# Patient Record
Sex: Male | Born: 1935
Health system: Southern US, Community
[De-identification: ages and names within clinical notes are randomized; demographics above are authoritative.]

## PROBLEM LIST (undated history)

## (undated) DIAGNOSIS — N183 Chronic kidney disease, stage 3 unspecified: Secondary | ICD-10-CM

## (undated) DIAGNOSIS — Z8673 Personal history of transient ischemic attack (TIA), and cerebral infarction without residual deficits: Secondary | ICD-10-CM

## (undated) DIAGNOSIS — I255 Ischemic cardiomyopathy: Secondary | ICD-10-CM

## (undated) DIAGNOSIS — I10 Essential (primary) hypertension: Secondary | ICD-10-CM

## (undated) DIAGNOSIS — M069 Rheumatoid arthritis, unspecified: Secondary | ICD-10-CM

## (undated) DIAGNOSIS — C61 Malignant neoplasm of prostate: Secondary | ICD-10-CM

## (undated) DIAGNOSIS — I739 Peripheral vascular disease, unspecified: Secondary | ICD-10-CM

## (undated) DIAGNOSIS — E7800 Pure hypercholesterolemia, unspecified: Secondary | ICD-10-CM

## (undated) DIAGNOSIS — I5022 Chronic systolic (congestive) heart failure: Secondary | ICD-10-CM

## (undated) DIAGNOSIS — I779 Disorder of arteries and arterioles, unspecified: Secondary | ICD-10-CM

## (undated) DIAGNOSIS — I2510 Atherosclerotic heart disease of native coronary artery without angina pectoris: Secondary | ICD-10-CM

## (undated) HISTORY — PX: CORONARY ARTERY BYPASS GRAFT: SHX141

## (undated) HISTORY — PX: KNEE ARTHROSCOPY: SUR90

## (undated) HISTORY — DX: Rheumatoid arthritis, unspecified: M06.9

## (undated) HISTORY — DX: Personal history of transient ischemic attack (TIA), and cerebral infarction without residual deficits: Z86.73

## (undated) HISTORY — DX: Essential (primary) hypertension: I10

## (undated) HISTORY — DX: Disorder of arteries and arterioles, unspecified: I77.9

## (undated) HISTORY — PX: CAROTID ENDARTERECTOMY: SUR193

## (undated) HISTORY — DX: Pure hypercholesterolemia, unspecified: E78.00

## (undated) HISTORY — DX: Atherosclerotic heart disease of native coronary artery without angina pectoris: I25.10

## (undated) HISTORY — PX: BACK SURGERY: SHX140

## (undated) HISTORY — DX: Chronic systolic (congestive) heart failure: I50.22

## (undated) HISTORY — DX: Peripheral vascular disease, unspecified: I73.9

## (undated) HISTORY — DX: Malignant neoplasm of prostate: C61

---

## 1998-09-27 DIAGNOSIS — Z8673 Personal history of transient ischemic attack (TIA), and cerebral infarction without residual deficits: Secondary | ICD-10-CM

## 1998-09-27 HISTORY — DX: Personal history of transient ischemic attack (TIA), and cerebral infarction without residual deficits: Z86.73

## 2003-06-12 ENCOUNTER — Ambulatory Visit (HOSPITAL_COMMUNITY): Admission: RE | Admit: 2003-06-12 | Discharge: 2003-06-12 | Payer: Self-pay | Admitting: Orthopedic Surgery

## 2003-06-12 ENCOUNTER — Encounter: Payer: Self-pay | Admitting: Orthopedic Surgery

## 2003-09-28 HISTORY — PX: RADIOACTIVE SEED IMPLANT: SHX5150

## 2003-10-07 ENCOUNTER — Ambulatory Visit (HOSPITAL_COMMUNITY): Admission: RE | Admit: 2003-10-07 | Discharge: 2003-10-07 | Payer: Self-pay | Admitting: Orthopedic Surgery

## 2003-12-19 ENCOUNTER — Emergency Department (HOSPITAL_COMMUNITY): Admission: EM | Admit: 2003-12-19 | Discharge: 2003-12-19 | Payer: Self-pay | Admitting: Emergency Medicine

## 2003-12-23 ENCOUNTER — Ambulatory Visit: Admission: RE | Admit: 2003-12-23 | Discharge: 2004-01-15 | Payer: Self-pay | Admitting: Radiation Oncology

## 2003-12-31 ENCOUNTER — Ambulatory Visit (HOSPITAL_COMMUNITY): Admission: RE | Admit: 2003-12-31 | Discharge: 2003-12-31 | Payer: Self-pay | Admitting: Cardiology

## 2004-03-26 ENCOUNTER — Encounter (INDEPENDENT_AMBULATORY_CARE_PROVIDER_SITE_OTHER): Payer: Self-pay | Admitting: Specialist

## 2004-03-26 ENCOUNTER — Inpatient Hospital Stay (HOSPITAL_COMMUNITY): Admission: RE | Admit: 2004-03-26 | Discharge: 2004-03-27 | Payer: Self-pay | Admitting: Vascular Surgery

## 2004-04-10 ENCOUNTER — Ambulatory Visit: Admission: RE | Admit: 2004-04-10 | Discharge: 2004-06-25 | Payer: Self-pay | Admitting: Radiation Oncology

## 2004-05-12 ENCOUNTER — Ambulatory Visit (HOSPITAL_COMMUNITY): Admission: RE | Admit: 2004-05-12 | Discharge: 2004-05-12 | Payer: Self-pay | Admitting: Urology

## 2004-05-12 ENCOUNTER — Ambulatory Visit (HOSPITAL_BASED_OUTPATIENT_CLINIC_OR_DEPARTMENT_OTHER): Admission: RE | Admit: 2004-05-12 | Discharge: 2004-05-12 | Payer: Self-pay | Admitting: Urology

## 2004-07-18 ENCOUNTER — Ambulatory Visit (HOSPITAL_COMMUNITY): Admission: RE | Admit: 2004-07-18 | Discharge: 2004-07-18 | Payer: Self-pay | Admitting: *Deleted

## 2005-08-27 ENCOUNTER — Inpatient Hospital Stay (HOSPITAL_BASED_OUTPATIENT_CLINIC_OR_DEPARTMENT_OTHER): Admission: RE | Admit: 2005-08-27 | Discharge: 2005-08-27 | Payer: Self-pay | Admitting: Cardiology

## 2006-09-14 ENCOUNTER — Inpatient Hospital Stay (HOSPITAL_COMMUNITY): Admission: EM | Admit: 2006-09-14 | Discharge: 2006-09-26 | Payer: Self-pay | Admitting: Emergency Medicine

## 2006-09-15 ENCOUNTER — Encounter: Payer: Self-pay | Admitting: Vascular Surgery

## 2006-09-16 ENCOUNTER — Encounter: Payer: Self-pay | Admitting: Vascular Surgery

## 2006-10-14 ENCOUNTER — Emergency Department (HOSPITAL_COMMUNITY): Admission: EM | Admit: 2006-10-14 | Discharge: 2006-10-14 | Payer: Self-pay | Admitting: Emergency Medicine

## 2006-10-27 ENCOUNTER — Encounter (HOSPITAL_COMMUNITY): Admission: RE | Admit: 2006-10-27 | Discharge: 2006-12-23 | Payer: Self-pay | Admitting: Cardiology

## 2006-11-24 ENCOUNTER — Ambulatory Visit: Payer: Self-pay | Admitting: Cardiothoracic Surgery

## 2007-03-03 ENCOUNTER — Inpatient Hospital Stay (HOSPITAL_BASED_OUTPATIENT_CLINIC_OR_DEPARTMENT_OTHER): Admission: RE | Admit: 2007-03-03 | Discharge: 2007-03-03 | Payer: Self-pay | Admitting: Cardiology

## 2007-03-09 ENCOUNTER — Ambulatory Visit (HOSPITAL_COMMUNITY): Admission: RE | Admit: 2007-03-09 | Discharge: 2007-03-10 | Payer: Self-pay | Admitting: Cardiology

## 2008-04-02 HISTORY — PX: CARDIOVASCULAR STRESS TEST: SHX262

## 2008-04-04 ENCOUNTER — Inpatient Hospital Stay (HOSPITAL_COMMUNITY): Admission: AD | Admit: 2008-04-04 | Discharge: 2008-04-06 | Payer: Self-pay | Admitting: Cardiology

## 2009-02-23 ENCOUNTER — Emergency Department (HOSPITAL_COMMUNITY): Admission: EM | Admit: 2009-02-23 | Discharge: 2009-02-24 | Payer: Self-pay | Admitting: Emergency Medicine

## 2009-04-09 ENCOUNTER — Inpatient Hospital Stay (HOSPITAL_COMMUNITY): Admission: EM | Admit: 2009-04-09 | Discharge: 2009-04-11 | Payer: Self-pay | Admitting: Cardiology

## 2009-04-09 ENCOUNTER — Ambulatory Visit: Payer: Self-pay | Admitting: Cardiovascular Disease

## 2009-07-25 ENCOUNTER — Encounter: Admission: RE | Admit: 2009-07-25 | Discharge: 2009-07-25 | Payer: Self-pay | Admitting: Internal Medicine

## 2010-06-12 ENCOUNTER — Ambulatory Visit: Payer: Self-pay | Admitting: Cardiology

## 2010-10-18 ENCOUNTER — Encounter: Payer: Self-pay | Admitting: Urology

## 2010-11-12 LAB — LIPID PANEL: HDL: 33 mg/dL — AB (ref 35–70)

## 2010-11-13 ENCOUNTER — Other Ambulatory Visit: Payer: Self-pay | Admitting: Dermatology

## 2010-12-04 ENCOUNTER — Encounter: Payer: Self-pay | Admitting: Cardiology

## 2010-12-04 DIAGNOSIS — Z8673 Personal history of transient ischemic attack (TIA), and cerebral infarction without residual deficits: Secondary | ICD-10-CM | POA: Insufficient documentation

## 2010-12-04 DIAGNOSIS — E7800 Pure hypercholesterolemia, unspecified: Secondary | ICD-10-CM | POA: Insufficient documentation

## 2010-12-04 DIAGNOSIS — I10 Essential (primary) hypertension: Secondary | ICD-10-CM | POA: Insufficient documentation

## 2010-12-04 DIAGNOSIS — I519 Heart disease, unspecified: Secondary | ICD-10-CM | POA: Insufficient documentation

## 2010-12-04 DIAGNOSIS — M109 Gout, unspecified: Secondary | ICD-10-CM | POA: Insufficient documentation

## 2010-12-04 DIAGNOSIS — N189 Chronic kidney disease, unspecified: Secondary | ICD-10-CM | POA: Insufficient documentation

## 2010-12-04 DIAGNOSIS — C61 Malignant neoplasm of prostate: Secondary | ICD-10-CM | POA: Insufficient documentation

## 2010-12-04 DIAGNOSIS — I25709 Atherosclerosis of coronary artery bypass graft(s), unspecified, with unspecified angina pectoris: Secondary | ICD-10-CM | POA: Insufficient documentation

## 2010-12-25 ENCOUNTER — Encounter: Payer: Self-pay | Admitting: Cardiology

## 2010-12-25 ENCOUNTER — Ambulatory Visit (INDEPENDENT_AMBULATORY_CARE_PROVIDER_SITE_OTHER): Payer: PRIVATE HEALTH INSURANCE | Admitting: Cardiology

## 2010-12-25 VITALS — BP 160/82 | HR 58 | Ht 73.0 in | Wt 189.4 lb

## 2010-12-25 DIAGNOSIS — R0989 Other specified symptoms and signs involving the circulatory and respiratory systems: Secondary | ICD-10-CM

## 2010-12-25 DIAGNOSIS — I2510 Atherosclerotic heart disease of native coronary artery without angina pectoris: Secondary | ICD-10-CM

## 2010-12-25 DIAGNOSIS — I10 Essential (primary) hypertension: Secondary | ICD-10-CM

## 2010-12-25 DIAGNOSIS — E7800 Pure hypercholesterolemia, unspecified: Secondary | ICD-10-CM

## 2010-12-25 DIAGNOSIS — I779 Disorder of arteries and arterioles, unspecified: Secondary | ICD-10-CM

## 2010-12-25 DIAGNOSIS — I519 Heart disease, unspecified: Secondary | ICD-10-CM

## 2010-12-25 NOTE — Patient Instructions (Signed)
Stop Tekturna.  We will schedule you for carotid doppler study.  Continue exercise.  Check blood pressure 3-4 times a week and keep a diary. Report back to me in 3 weeks with readings. Call Synetta Fail at [phone].  We will schedule you for a follow up office visit in 6 months.

## 2010-12-27 ENCOUNTER — Encounter: Payer: Self-pay | Admitting: Cardiology

## 2010-12-27 DIAGNOSIS — I779 Disorder of arteries and arterioles, unspecified: Secondary | ICD-10-CM | POA: Insufficient documentation

## 2010-12-27 NOTE — Assessment & Plan Note (Signed)
Blood pressure is significantly elevated today but other readings have been normal. We have recommended stopping his Tekturna given recent advisory concerning use in patients on ACE inhibitors. I have asked that he monitor his blood pressure closely and call us with a report in 3 weeks. If his blood pressure remains elevated we may need to add clonidine or hydralazine to his current regimen.

## 2010-12-27 NOTE — Assessment & Plan Note (Signed)
He has no significant anginal symptoms on medical therapy. We will continue with risk factor modification.

## 2010-12-27 NOTE — Assessment & Plan Note (Signed)
Patient is well compensated on medical therapy with no evidence of volume overload. Continue therapy with ACE inhibitors and beta blocker. Currently on no diuretic therapy.

## 2010-12-27 NOTE — Assessment & Plan Note (Signed)
Status post bilateral carotid endarterectomies. He does have a bruit on the right. His last Doppler study was in 2007. We will schedule for repeat Doppler studies.

## 2010-12-27 NOTE — Progress Notes (Signed)
HPI Pelvis seen today for followup. He states he is doing very well. He has had no significant chest pain or shortness of breath. He denies any palpitations. He does note that his energy is not as good as it once was. He remains active exercising in the pool 3 days a week and playing golf once a week. His blood pressure readings at home and with his primary physician have been normal. No Known Allergies  Current Outpatient Prescriptions on File Prior to Visit  Medication Sig Dispense Refill  . allopurinol (ZYLOPRIM) 300 MG tablet Take 300 mg by mouth daily.        Marland Kitchen amLODipine (NORVASC) 10 MG tablet Take 10 mg by mouth daily.        Marland Kitchen aspirin 81 MG tablet Take 81 mg by mouth daily.        Marland Kitchen atorvastatin (LIPITOR) 40 MG tablet Take 40 mg by mouth daily.        . benazepril (LOTENSIN) 40 MG tablet Take 40 mg by mouth daily.        . clopidogrel (PLAVIX) 75 MG tablet Take 75 mg by mouth daily.        . ergocalciferol (VITAMIN D2) 50000 UNITS capsule Take 50,000 Units by mouth every 30 (thirty) days. Twice monthly      . folic acid (FOLVITE) 400 MCG tablet Take 400 mcg by mouth 2 (two) times daily.       . isosorbide mononitrate (IMDUR) 30 MG 24 hr tablet Take 30 mg by mouth 2 (two) times daily.        . methotrexate (RHEUMATREX) 2.5 MG tablet Take 2.5 mg by mouth once a week. Caution:Chemotherapy. Protect from light. 4 tablets once a week      . metoprolol tartrate (LOPRESSOR) 25 MG tablet Take 12.5 mg by mouth 2 (two) times daily.        Marland Kitchen aliskiren (TEKTURNA) 150 MG tablet Take 150 mg by mouth daily.        Marland Kitchen co-enzyme Q-10 30 MG capsule Take 30 mg by mouth daily.        . metFORMIN (GLUCOPHAGE) 500 MG tablet Take 1 tablet (500 mg total) by mouth daily.  30 tablet      Past Medical History  Diagnosis Date  . CAD (coronary artery disease)     REDO CABG 2007. SUBSEQUENT OCCLUSION OF ALL VEIN GRAFTS.   Marland Kitchen HTN (hypertension)   . Hypercholesteremia   . Chronic renal insufficiency   . Left  ventricular dysfunction     MODERATE  . Gout   . History of TIAs 2000  . Prostate cancer     Past Surgical History  Procedure Date  . Coronary artery bypass graft 0454,0981  . Carotid endarterectomy 1992, 2007    LEFT, RIGHT  . Back surgery   . Radioactive seed implant 2005  . Knee arthroscopy     RIGHT KNEE    Family History  Problem Relation Age of Onset  . Other Father 48    CEREBRAL HEMORRHAGE  . Cancer Mother 43    History   Social History  . Marital Status: Married    Spouse Name: N/A    Number of Children: 3  . Years of Education: N/A   Occupational History  . [employer]     retired   Social History Main Topics  . Smoking status: Former Smoker    Types: Cigarettes    Quit date: 09/27/1986  . Smokeless tobacco:  Not on file  . Alcohol Use: No  . Drug Use: No  . Sexually Active:    Other Topics Concern  . Not on file   Social History Narrative  . No narrative on file    ROS The patient denies any heat or cold intolerance.  No weight gain or weight loss.  The patient denies headaches or blurry vision.  There is no cough or sputum production.  The patient denies dizziness.  There is no hematuria or hematochezia.  The patient denies any muscle aches or arthritis.  The patient denies any rash.  The patient denies frequent falling or instability.  There is no history of depression or anxiety.  All other systems were reviewed and are negative.  PHYSICAL EXAM BP 160/82  Pulse 58  Ht 6\' 1"  (1.854 m)  Wt 189 lb 6.4 oz (85.911 kg)  BMI 24.99 kg/m2 The patient is alert and oriented x 3.  The mood and affect are normal.  The skin is warm and dry.  Color is normal.  The HEENT exam reveals that the sclera are nonicteric.  The mucous membranes are moist.  The carotids are 2+ with a soft right carotid bruit. He has bilateral endarterectomy scars.  There is no thyromegaly.  There is no JVD.  The lungs are clear.  The chest wall is non tender. There is an  old median sternotomy scar.  The heart exam reveals a regular rate with a normal S1 and S2.  There are no murmurs, gallops, or rubs.  The PMI is not displaced.   Abdominal exam reveals good bowel sounds.  There is no guarding or rebound.  There is no hepatosplenomegaly or tenderness.  There are no masses.  Exam of the legs reveal no clubbing, cyanosis, or edema.  The legs are without rashes.  The distal pulses are intact.  Cranial nerves II - XII are intact.  Motor and sensory functions are intact.  The gait is normal.  Laboratory data: Dated November 12, 2010 complete chemistry panel was normal. Total cholesterol 137, triglycerides 151, HDL 33, LDL 74. TSH was normal. ASSESSMENT AND PLAN

## 2010-12-27 NOTE — Assessment & Plan Note (Signed)
Lipids are acceptable on current therapy with Lipitor.

## 2010-12-30 ENCOUNTER — Other Ambulatory Visit: Payer: Self-pay | Admitting: Cardiology

## 2010-12-30 DIAGNOSIS — R0989 Other specified symptoms and signs involving the circulatory and respiratory systems: Secondary | ICD-10-CM

## 2011-01-03 LAB — BASIC METABOLIC PANEL
CO2: 27 mEq/L (ref 19–32)
Calcium: 8.6 mg/dL (ref 8.4–10.5)
Calcium: 8.7 mg/dL (ref 8.4–10.5)
Chloride: 108 mEq/L (ref 96–112)
GFR calc Af Amer: 59 mL/min — ABNORMAL LOW (ref >60)
GFR calc non Af Amer: 49 mL/min — ABNORMAL LOW (ref >60)
Glucose, Bld: 139 mg/dL — ABNORMAL HIGH (ref 70–99)
Glucose, Bld: 107 mg/dL — ABNORMAL HIGH (ref 70–99)
Potassium: 3.5 mEq/L (ref 3.5–5.1)
Potassium: 4 mEq/L (ref 3.5–5.1)
Sodium: 137 mEq/L (ref 135–145)
Sodium: 140 mEq/L (ref 135–145)

## 2011-01-03 LAB — CBC
HCT: 38.5 % — ABNORMAL LOW (ref 39.0–52.0)
HCT: 40.8 % (ref 39.0–52.0)
Hemoglobin: 12.9 g/dL — ABNORMAL LOW (ref 13.0–17.0)
Hemoglobin: 12.9 g/dL — ABNORMAL LOW (ref 13.0–17.0)
Hemoglobin: 13.8 g/dL (ref 13.0–17.0)
MCV: 88.1 fL (ref 78.0–100.0)
Platelets: 213 10*3/uL (ref 150–400)
RBC: 4.31 MIL/uL (ref 4.22–5.81)
RBC: 4.36 MIL/uL (ref 4.22–5.81)
RBC: 4.63 MIL/uL (ref 4.22–5.81)
WBC: 6.5 10*3/uL (ref 4.0–10.5)
WBC: 11 10*3/uL — ABNORMAL HIGH (ref 4.0–10.5)
WBC: 13.4 10*3/uL — ABNORMAL HIGH (ref 4.0–10.5)

## 2011-01-03 LAB — GLUCOSE, CAPILLARY: Glucose-Capillary: 162 mg/dL — ABNORMAL HIGH (ref 70–99)

## 2011-01-03 LAB — COMPREHENSIVE METABOLIC PANEL
AST: 29 U/L (ref 0–37)
BUN: 29 mg/dL — ABNORMAL HIGH (ref 6–23)
CO2: 24 mEq/L (ref 19–32)
Chloride: 108 mEq/L (ref 96–112)
Creatinine, Ser: 1.56 mg/dL — ABNORMAL HIGH (ref 0.4–1.5)
GFR calc non Af Amer: 44 mL/min — ABNORMAL LOW (ref >60)
Total Bilirubin: 0.7 mg/dL (ref 0.3–1.2)

## 2011-01-03 LAB — CARDIAC PANEL(CRET KIN+CKTOT+MB+TROPI)
CK, MB: 5.4 ng/mL — ABNORMAL HIGH (ref 0.3–4.0)
CK, MB: 6.2 ng/mL — ABNORMAL HIGH (ref 0.3–4.0)
Relative Index: INVALID (ref 0.0–2.5)
Total CK: 57 U/L (ref 7–232)
Total CK: 81 U/L (ref 7–232)
Troponin I: 0.58 ng/mL (ref 0.00–0.06)

## 2011-01-03 LAB — HEMOGLOBIN A1C: Hgb A1c MFr Bld: 6 % (ref 4.6–6.1)

## 2011-01-03 LAB — DIFFERENTIAL
Basophils Absolute: 0.2 10*3/uL — ABNORMAL HIGH (ref 0.0–0.1)
Eosinophils Relative: 1 % (ref 0–5)
Lymphocytes Relative: 19 % (ref 12–46)
Neutro Abs: 9.8 10*3/uL — ABNORMAL HIGH (ref 1.7–7.7)

## 2011-01-03 LAB — HEPARIN LEVEL (UNFRACTIONATED): Heparin Unfractionated: 1.08 IU/mL — ABNORMAL HIGH (ref 0.30–0.70)

## 2011-01-05 LAB — URINALYSIS, ROUTINE W REFLEX MICROSCOPIC
Bilirubin Urine: NEGATIVE
Hgb urine dipstick: NEGATIVE
Ketones, ur: NEGATIVE mg/dL
Nitrite: NEGATIVE
Protein, ur: NEGATIVE mg/dL
Specific Gravity, Urine: 1.01 (ref 1.005–1.030)
Urobilinogen, UA: 0.2 mg/dL (ref 0.0–1.0)

## 2011-01-07 ENCOUNTER — Other Ambulatory Visit: Payer: Self-pay | Admitting: Cardiology

## 2011-01-07 DIAGNOSIS — R0989 Other specified symptoms and signs involving the circulatory and respiratory systems: Secondary | ICD-10-CM

## 2011-01-08 ENCOUNTER — Encounter: Payer: PRIVATE HEALTH INSURANCE | Admitting: Cardiology

## 2011-01-08 ENCOUNTER — Ambulatory Visit (INDEPENDENT_AMBULATORY_CARE_PROVIDER_SITE_OTHER): Payer: PRIVATE HEALTH INSURANCE | Admitting: Cardiology

## 2011-01-08 DIAGNOSIS — I6529 Occlusion and stenosis of unspecified carotid artery: Secondary | ICD-10-CM

## 2011-01-08 DIAGNOSIS — R0989 Other specified symptoms and signs involving the circulatory and respiratory systems: Secondary | ICD-10-CM

## 2011-01-11 ENCOUNTER — Encounter: Payer: Self-pay | Admitting: Cardiology

## 2011-01-12 ENCOUNTER — Telehealth: Payer: Self-pay | Admitting: *Deleted

## 2011-01-12 DIAGNOSIS — I10 Essential (primary) hypertension: Secondary | ICD-10-CM

## 2011-01-12 MED ORDER — HYDRALAZINE HCL 25 MG PO TABS: 25.0000 mg | ORAL_TABLET | Freq: Two times a day (BID) | ORAL | Status: DC

## 2011-01-12 NOTE — Telephone Encounter (Signed)
Notified of doppler results. Sent to Dr. Ricki Miller.

## 2011-01-12 NOTE — Telephone Encounter (Signed)
Came by with BP readings; Dr. Swaziland reviewed and states he needs to add Hydralizine 25 mg BID. BP readings averaged 129/63 to 167/85. Advised to continue to monitor BP

## 2011-01-19 ENCOUNTER — Encounter: Payer: Self-pay | Admitting: Cardiology

## 2011-02-09 NOTE — H&P (Signed)
NAME:  TRAJAN, GROVE NO.:  [id number]   MEDICAL RECORD NO.:  [id number]           PATIENT TYPE:   LOCATION:                                 FACILITY:   PHYSICIAN:  Peter M. Swaziland, M.D.  DATE OF BIRTH:  [birth year]   DATE OF ADMISSION:  DATE OF DISCHARGE:                              HISTORY & PHYSICAL   HISTORY OF PRESENT ILLNESS:  [name] [name] is a 77 year old white male  who has a known history of coronary artery disease.  He suffered a  myocardial infarction in 58 and in 1989.  He had multiple attempts at  angioplasty that were unsuccessful due to restenosis.  He subsequently  had coronary bypass surgery in 1989.  This included saphenous vein  grafts sequentially to the PDA and distal obtuse marginal vessel and a  LIMA graft to the LAD.  He had good left ventricular function with mild  inferior wall hypokinesia.  In December of 2007, he was admitted again  with a myocardial infarction.  This was a non-Q-wave myocardial  infarction.  He underwent repeat coronary angiography, and this  demonstrated severe disease in the vein graft sequentially to the PDA  and OM with thrombus.  He subsequently underwent repeat coronary artery  bypass surgery by Dr. Tyrone Sage.  This included redoing his sequential  bypass graft to the PDA and OM.  Also an intermediate branch was  grafted.  His LIMA graft was left in place.  The patient did well  initially postoperatively, but he since has been experiencing symptoms  of intermittent angina.  Some days he can walk on his treadmill without  any difficulty, but other days just walking to his mailbox he gets  significant chest pain and has to stop walking.  He subsequently was  evaluated with a stress Cardiolite study on Feb 24, 2007.  This  demonstrated a moderate inferolateral fixed defect consistent with scar.  However, there was a reversible anterior wall defect consistent with  ischemia.  His ejection fraction was reduced at  38%.  It was felt that  the inferior wall defect was consistent with his prior infarction.  However, we could not explain the anterior wall ischemia, and given his  ongoing symptoms, it was recommended he undergo cardiac catheterization  to further define his anatomy.  It was noted on his prior cardiac  catheterization that he had a 40-50% stenosis in the left subclavian  artery, and this was a potential reason for ischemia in the LAD  territory given the IMA graft.  However, he has no pressure differential  between his arms and denies any left arm pain or any vertebrobasilar  symptoms.   PAST MEDICAL HISTORY:  1. Coronary disease as noted above.  2. Hyperlipidemia.  3. Hypertension.  4. Prior bypass surgery in 1989 and in 2007.  5. History of gout.  6. History of TIAs in 2000.  7. Prior left carotid endarterectomy in 1992.  8. Prior back surgery.  9. History of prostate cancer with radioactive seed implant in 2005.  10.He had a right carotid endarterectomy in November of 2007.  11.He has had right knee arthroscopy.   CURRENT MEDICATIONS:  1. Tricor 145 mg daily.  2. Furosemide 10 mg per day.  3. Metoprolol 50 mg b.i.d.  4. Aspirin 325 mg daily.  5. Allopurinol 300 mg daily.  6. Amlodipine 10 mg per day.  7. Fosinopril HCT 20/12.5 mg daily.   ALLERGIES:  He has no known allergies.   SOCIAL HISTORY:  He quit smoking in 1988; he had a 30 pack-year history  of smoking.  He has 3 children in good health.  He is married.  He is  retired from [employer].   FAMILY HISTORY:  His father died of a cerebral hemorrhage at age 40.  Mother died of cancer at age 2.  One sister, age 74, is alive and well.   REVIEW OF SYSTEMS:  He has had no significant claudication.  He denies  any recent TIA or stroke.  He has had no arm pain.  No orthopnea.  Other  review of systems are negative.   PHYSICAL EXAMINATION:  GENERAL:  The patient is a pleasant white male in  no apparent  distress.  VITAL SIGNS:  His weight is 187, blood pressures 160/80, pulse 64 and  regular.  HEENT:  He is normocephalic and atraumatic.  His pupils are equal, round  and reactive to light and accommodation.  Extraocular movements are  full.  Oropharynx is clear.  NECK:  Supple without JVD, adenopathy, thyromegaly or bruits.  He has  bilateral carotid endarterectomy scars.  LUNGS:  Clear.  CARDIAC:  A regular rate and rhythm without gallop, murmur, rub or  click.  ABDOMEN:  Soft, nontender, without masses or bruits.  EXTREMITIES:  Without edema.  Pulses are 2+ and symmetric.  NEUROLOGIC:  Nonfocal.   LABORATORY DATA:  Resting ECG shows normal sinus rhythm with occasional  PVCs, nonspecific ST abnormality.  His glucose is 139, BUN 26,  creatinine 1.7, sodium 141, potassium 4.2, chloride 101, CO2 of 29.  Coags were normal.  CBC is normal.   IMPRESSION:  1. Recurrent angina pectoris.  2. Abnormal adenosine Cardiolite study.  3. Status post myocardial infarction.  4. Status post redo coronary bypass surgery in December of 2007.  5. Status post bilateral carotid endarterectomy.  6. Mild renal insufficiency.  7. Hypertension.  8. Hyperlipidemia.  9. Gout.   PLAN:  Will proceed with diagnostic cardiac catheterization.           ______________________________  Peter M. Swaziland, M.D.     PMJ/MEDQ  D:  03/01/2007  T:  03/01/2007  Job:  [id number]   cc:   Juline Patch, M.D.  Sheliah Plane, MD

## 2011-02-09 NOTE — Discharge Summary (Signed)
[place], [name] [name] NO.:  [id number]   MEDICAL RECORD NO.:  [id number]          PATIENT TYPE:  OIB   LOCATION:  6523                         FACILITY:  [facility]   PHYSICIAN:  Peter M. Swaziland, M.D.  DATE OF BIRTH:  [birth year]   DATE OF ADMISSION:  03/09/2007  DATE OF DISCHARGE:  03/10/2007                               DISCHARGE SUMMARY   INDICATION FOR PROCEDURE:  A 77 year old white male status post remote  angioplasties and coronary artery bypass surgery.  He presented in  December 2007 with a myocardial infarction and underwent redo coronary  artery bypass surgery, including having a vein graft placed the diagonal  and a sequential vein graft to the PDA and posterolateral branches of  the left circumflex coronary artery.  His LIMA graft was still patent at  that time.  He has had recurrent anginal symptoms and a positive  Cardiolite study.  Subsequent diagnostic cardiac catheterization  demonstrated occlusion of both the new vein grafts to the circumflex and  diagonal branches.  His old vein graft to the PDA was still patent and  but had an 80% ostial stenosis followed by a 60% stenosis in the  proximal vessel.  The native diagonal also had an 80% stenosis in the  proximal vessel and it was recommended he undergo stenting of both these  vessels.  He was admitted for that purpose.   For details his past medical history, social history, family history and  physical exam, please see prior dictated history and physical.   LABORATORY DATA:  His ECG at rest showed normal sinus rhythm with LVH by  voltage, otherwise no acute change.  His CBC was normal.  Glucose is  101, BUN 25, creatinine 1.6, sodium 142, potassium 4.8, chloride 104,  CO2 29.   HOSPITAL COURSE:  The patient was brought in as an outpatient to the  cardiac catheterization laboratory.  There he underwent stenting of the  proximal and ostial vein graft to the PDA using a 3.5 x 28 mm Cypher  stent.   This was postdilated to 4 atmospheres with a 4.0 Quantum  balloon.  A filter wire was deployed but we did not obtain any  significant debris post procedure.  He had an excellent angiographic  result and no significant restenosis and TIMI grade 3 flow.  We next  intervened on the first diagonal branch.  This was stented using a 2.5 x  18 mm Cypher stent.  Again an excellent angiographic result was obtained  with 0% residual stenosis and TIMI grade 3 flow.  The patient did have  some chest discomfort with balloon inflations but otherwise had no chest  discomfort.  After procedure his ECG remained stable.  He was  hemodynamically stable.  He was maintained on aspirin and Plavix and his  subsequent follow-up lab work showed a CPK of 109 with 9.3 MB and  troponin of 0.38.  His BUN was 25 and creatinine 1.41.  He had no groin  complications.  He was pain-free and ambulatory and had no ECG changes.  The patient was discharged home in stable condition  on March 10, 2007.   DISCHARGE DIAGNOSES:  1. Coronary artery disease, status post redo coronary artery bypass      graft in December 2007 with subsequent vein graft failure, now      status post successful stenting of the native diagonal branch and      the old saphenous vein graft to the posterior descending artery.  2. Mild enzyme leak without symptoms or electrocardiogram changes.  3. Hypertension.  4. Hyperlipidemia.  5. History of chronic renal insufficiency.  6. History of gout.  7. Prior transient ischemic attacks with previous left carotid      endarterectomy.  8. History of prostate cancer, status post radioactive seed implant.   DISCHARGE MEDICATIONS:  1. Coated aspirin 325 mg daily.  2. Plavix 75 mg daily.  3. Tricor 145 mg daily.  4. Allopurinol 300 mg per day.  5. Metoprolol 50 mg twice a day.  6. Lipitor 40 mg per day.  7. Amlodipine 10 mg daily.  8. Lisinopril/HCT 20/12.5 mg daily.  9. Nitroglycerin sublingual p.r.n.   The  patient is given activity instructions and diet instructions.  He  will be followed by Dr. Swaziland in 1-2 weeks.   DISCHARGE STATUS:  Improved.           ______________________________  Peter M. Swaziland, M.D.     PMJ/MEDQ  D:  03/10/2007  T:  03/10/2007  Job:  [id number]   cc:   Juline Patch, M.D.  Sheliah Plane, MD

## 2011-02-09 NOTE — Discharge Summary (Signed)
NAME:  [name] [name], CENTOLA NO.:  [id number]   MEDICAL RECORD NO.:  [id number]          PATIENT TYPE:  INP   LOCATION:  2504                         FACILITY:  [facility]   PHYSICIAN:  Peter M. Swaziland, M.D.  DATE OF BIRTH:  December 01, 1931   DATE OF ADMISSION:  04/09/2009  DATE OF DISCHARGE:  04/11/2009                               DISCHARGE SUMMARY   HISTORY OF PRESENT ILLNESS:  Mr. Savas Elvin is a 77 year old white male  with known history of coronary disease.  He is status post original  coronary artery bypass surgery in 1989.  He had redo coronary artery  bypass surgery in 2007.  However, he had early vein graft failure with  occlusion of his vein grafts within 6 months.  He subsequently had  stenting of an old vein graft to the PDA, but this subsequently failed  as well.  He had stenting of the diagonal branch.  He still had a patent  LIMA graft to the LAD and has been treated medically.  On this occasion,  the patient was in [place], [place] eating dinner when he  experienced a low-grade, grade 3/10 chest pain radiating to his right  shoulder.  He took nitroglycerin with some easing of the pain, but it  did persist.  He went to the hospital there and troponin came back  elevated at 1.08.  He was started on heparin and Plavix and transferred  to our facility for further evaluation.  Of note, he remained pain free  after his initial medical therapy.  He was pain free on arrival here.   For details of his past medical history, social history, family history,  and physical exam, please see admission history and physical.   LABORATORY DATA:  Chest x-ray showed calcified tortuous aorta, otherwise  no active disease.  His ECG showed sinus bradycardia with an incomplete  left bundle-branch block.  There were no acute ST or T-wave changes.  White count was 13,400, hemoglobin 13.8, hematocrit 40.8, platelets  256,000.  Coags were normal.  BNP level was 283.  Sodium was 140,  potassium 3.4, chloride 108, CO2 of 24, BUN 29, creatinine 1.56, glucose  of 107.  Liver function studies were all normal.  Calcium was normal at  9.0.  Cardiac panels here showed a troponin of 0.86, 0.92, and 0.58.  CPKs were 77 with 7.6 MB and 81 with 6.2 MB, and 57 with 5.4 MB.  A1c  was 6.0.   HOSPITAL COURSE:  The patient was admitted to intensive care.  He was  treated with IV nitroglycerin and heparin.  He was loaded with Plavix.  He had no subsequent chest pain through his hospital stay.  Because of  marked bradycardia, his metoprolol dose was reduced to 12.5 mg twice a  day.  He did have a leukocytosis that improved.  He was gently hydrated  and given Mucomyst therapy.  On April 10, 2009, he underwent cardiac  catheterization.  This demonstrated total occlusion of the dominant left  circumflex system.  He also had total occlusion of the native right  coronary artery.  He had collateral flow to the right coronary artery,  PDA, the obtuse marginal branch, and intermediate branch.  The stent in  the native diagonal was widely patent.  The LAD was supplied well by the  LIMA graft.  Left ventricular ejection fraction of 35%-40%.  Compared to  his prior study 1 year ago, there was no significant change.  Based on  these findings, we recommended continued medical management.  The  patient was ambulated in halls.  He had no groin complications.  At the  time of discharge, his BUN was 22 with creatinine of 1.2.  White count  was 6500.  The only change in his discharge medications was the addition  of Plavix 75 mg daily and a new prescription for Nitrolingual spray.   DISCHARGE DIAGNOSES:  1. Small non-Q-wave myocardial infarction.  2. Coronary disease status post redo coronary artery bypass surgery.  3. Mild renal insufficiency.  4. Leukocytosis, resolved.  5. Hypertension.   DISCHARGE MEDICATIONS:  1. Lisinopril/HCT 20/12.5 mg daily.  2. Amlodipine 10 mg daily.  3. Metoprolol 12.5  mg twice a day.  4. Ranexa 500 mg twice a day.  5. Isosorbide mononitrate 30 mg daily.  6. Allopurinol 300 mg per day.  7. Aspirin 325 mg daily.  8. Fenofibrate 67 mg daily.  9. Vitamin D 50,000 units monthly.  10.Folic acid 400 mcg daily.  11.Lipitor 80 mg daily.  12.CoQ10 daily.  13.Plavix 75 mg daily.  14.Nitrolingual sublingual spray p.r.n.   The patient has a followup appointment with Dr. Swaziland within the next 3  weeks.  He will gradually resume normal activities.   DISCHARGE STATUS:  Improved.           ______________________________  Peter M. Swaziland, M.D.     PMJ/MEDQ  D:  04/11/2009  T:  04/11/2009  Job:  [id number]

## 2011-02-09 NOTE — Cardiovascular Report (Signed)
NAMELEOPOLD, [name] [name] NO.:  [id number]   MEDICAL RECORD NO.:  [id number]          PATIENT TYPE:  OIB   LOCATION:  1962                         FACILITY:  [facility]   PHYSICIAN:  Peter M. Swaziland, M.D.  DATE OF BIRTH:  March 31, 1932   DATE OF PROCEDURE:  03/03/2007  DATE OF DISCHARGE:                            CARDIAC CATHETERIZATION   INDICATIONS FOR PROCEDURE:  77 year old white male who is status post  redo coronary artery bypass surgery in December of 2007.  He presents  with recurrent anginal symptoms.  Stress Cardiolite study shows evidence  of inferolateral infarct and anterior wall ischemia.  Ejection fraction  was 38%.   PROCEDURE:  Left heart catheterization, coronary left ventricular  angiography, abdominal aortic angiography, saphenous vein graft  angiography x2 and LIMA graft angiography.  Also angiography of the left  subclavian artery.   ACCESS:  Via the right femoral artery using the standard Seldinger  technique.   EQUIPMENT USED:  4-French 5-cm left Judkins catheter for the left  coronary, 4-French 3D-RC catheter for the right coronary, a multipurpose  catheter for the vein graft to the PDA, a left Amplatz 1 catheter for  the vein graft to the diagonal, a LIMA catheter for the LIMA.  Also a 4-  French pigtail catheter.   CONTRAST:  195 mL of Omnipaque.   MEDICATIONS:  Local anesthesia with 1% Xylocaine, Versed 2 mg IV.   HEMODYNAMIC DATA:  Aortic pressure was 142/56 with a mean of 88 mmHg.  Left ventricular pressure is 143 with an EDP of 20 mmHg.  Also of note,  there was a 10-mm pressure gradient across the left subclavian origin.   ANGIOGRAPHIC DATA:  The left coronary arises and distributes normally.  It is a dominant vessel.  The left main coronary is a moderately  calcified.  It has diffuse narrowing up to 20%.   The left anterior descending artery has 60% stenosis proximally and  segmental 50% stenosis in the midvessel.  The mid to  distal vessel fills  by competitive flow from the IMA graft.   The first diagonal branch is a moderate size vessel.  It has an 80%  stenosis in the proximal vessel.   The left circumflex coronary artery is occluded proximally at its  origin.   The right coronary artery is a small nondominant vessel which is  occluded proximally.  There are faint right to right bridging  collaterals to the right coronary.   The saphenous vein graft to the PDA and distal circumflex has an  inferior takeoff from the marker.  There is an 80% eccentric stenosis at  the origin of the vein graft followed by 60-70% stenosis in the proximal  vein graft.  This vein graft inserts into the PDA.  The ongoing segment  to the distal circumflex is occluded.  However, this segment does fill  by the portion of the old vein graft coursing between the PDA and distal  circumflex.   The saphenous vein graft to the diagonal branch is occluded proximally  at its ostium.   The LIMA graft to the LAD is  widely patent with excellent distal runoff.   The left subclavian artery arteriography demonstrates at least a  moderate stenosis at the margin of the subclavian.  This is very  difficult to visualize with angiography given the difficulty in seating  the catheter right at the origin.  However, there is heavy calcification  in this region.  By pullback, there is a 10-mm gradient across the  origin of the subclavian.   Left ventricular angiography was performed in RAO view.  This  demonstrates mild left ventricular enlargement.  There is inferior wall  hypokinesia with very mild anterior wall hypokinesia.  Ejection fraction  estimated 40%.   The abdominal aortography was performed.  This demonstrates widely  patent renal and celiac vessels.  There is no significant aneurysm  noted.   FINAL INTERPRETATION:  1. Severe three-vessel obstructive coronary artery disease.  2. Patent left internal mammary artery graft to the  left anterior      descending.  3. Occluded saphenous vein graft to the diagonal.  4. Patent saphenous vein graft to the patent ductus arteriosus and      distal circumflex but with moderately severe stenosis at its      origin.  5. Moderate left ventricular dysfunction.  6. Moderate left subclavian stenosis at its origin which is heavily      calcified.  7. No significant renal artery stenosis or aneurysm.   PLAN:  Would consider the patient for percutaneous intervention with  stenting of the diagonal branch and also the vein graft to the PDA.           ______________________________  Peter M. Swaziland, M.D.     PMJ/MEDQ  D:  03/03/2007  T:  03/03/2007  Job:  [id number]   cc:   Juline Patch, M.D.  Sheliah Plane, MD

## 2011-02-09 NOTE — H&P (Signed)
NAME:  [name] [name], [name] [name]            ACCOUNT NO.:  [id number]   MEDICAL RECORD NO.:  [id number]          PATIENT TYPE:  INP   LOCATION:  2913                         FACILITY:  [facility]   PHYSICIAN:  Brayton El, MD    DATE OF BIRTH:  1932-07-18   DATE OF ADMISSION:  04/09/2009  DATE OF DISCHARGE:                              HISTORY & PHYSICAL   CHIEF COMPLAINT:  Non-STEMI.   HISTORY OF PRESENT ILLNESS:  The patient is a 77 year old white male  with past medical history significant for coronary artery disease,  status post CABG x2, and last heart catheterization July 2009 showing  occlusion of all vein grafts and a patent LIMA to the LAD was 60% LAD  stenosis, dyslipidemia, hypertension, renal insufficiency, gout,  peripheral vascular disease, status post bilateral carotid  endarterectomies, prostate cancer, status post radioactive seed  implantation who is presenting upon transfer from [facility]  with a non-STEMI.  The patient states that yesterday evening while at  dinner he experienced 3/10 substernal chest discomfort that radiated to  his right shoulder.  The patient states this is similar to chest pain he  experienced with previous angina.  However, it was milder.  The patient  took nitroglycerin at home and it only eased off the pain.  The  nitroglycerin was older than one year.  The patient reported to the  emergency room where a medical intervention at that time caused him to  be chest pain free.  The patient states since that time he has not had  any recurrence of chest discomfort.  The patient also states that prior  to chest discomfort experienced at home he had been in his normal state  of health.  Labs a the outside hospital initially revealed a completely  normal troponin; however, a second troponin came back at 1.08.  The  patient was started on heparin and loaded with 300 mg of Plavix.  He is  transferred here for consideration of left heart  catheterization and  reevaluation of his LIMA graft and native LAD.   PAST MEDICAL HISTORY:  As above in HPI.   SOCIAL HISTORY:  History of tobacco use.  No longer smokes.  He does not  drink alcohol.  The patient lives at home with his wife.   FAMILY HISTORY:  Noncontributory.   ALLERGIES:  NO KNOWN DRUG ALLERGIES.   MEDICATIONS:  1. Aspirin 325 daily.  2. Lisinopril/HCTZ. 20/12.5 daily.  3. Amlodipine 10 mg daily.  4. Metoprolol 25 mg b.i.d.  5. Ranexa 500 mg b.i.d.  6. Isosorbide mononitrate 30 mg daily.  7. Allopurinol 300 mg daily.  8. Fenofibrate 67 mg daily.  9. Lipitor 80 mg daily.  10.Co-enzyme Q10.  11.Folic acid 400 mcg daily.  12.Vitamin D monthly.   REVIEW OF SYSTEMS:  As in HPI.  Specifically the patient also  specifically denies any PND, orthopnea, or lower extremity edema.  All  other systems were reviewed and are negative.   PHYSICAL EXAMINATION:  VITAL SIGNS:  The patient is afebrile, pulse 71,  blood pressure 169/68, satting 97% on 2 liters.  GENERAL:  He is in no acute distress.  HEENT:  Nonfocal.  NECK:  Supple.  There is no JVD.  There are no carotid bruits.  HEART:  Regular rate and rhythm with a very soft 1/6 systolic murmur at  the left sternal border.  LUNGS:  Clear to auscultation bilaterally.  ABDOMEN:  Soft, nontender, nondistended.  EXTREMITIES:  Without edema.  SKIN  warm and dry.  No evidence of rash or lesion.  NEUROLOGIC:  Nonfocal.  PSYCHIATRIC:  The patient is appropriate with normal levels of insight.   LABORATORY DATA:  Labs from the outside hospital show sodium 141,  potassium 4.2, chloride 106, CO2 21, BUN 46, creatinine 1.9, glucose  254.  White count 17, hemoglobin 14, hematocrit 40, platelet count 307.  There is no left shift.  His troponin is 1.08, his CK-MB is 7.2.   EKG independently reviewed by myself demonstrates normal sinus rhythm  with frequent PACs.  There are Q-waves in V1 and V2.  There are  nonspecific T-wave  abnormalities.  There is left ventricular  hypertrophy.  Chest x-ray not taken at this time.   ASSESSMENT:  1. Non-ST elevation myocardial infarction.  2. Renal insufficiency.  3. Probable prerenal azotemia.  4. Leukocytosis.  5. Hyperglycemia.   PLAN:  The patient will be admitted to the CCU.  He will be continued on  his current treatment for the non-STEMI  which includes aspirin, Plavix,  heparin, beta blocker, statin and nitroglycerin.  He is currently  hypertensive and the nitroglycerin will be titrated up in order to help  better control his blood pressure.  The left heart catheterization will  be considered in order to evaluate the patency of his LIMA graft as well  to evaluate for progression of the known 60% LAD stenosis.  A viability  study could be considered prior to left heart catheterization.  The  patient's leukocytosis is likely secondary to demargination from stress  of the non-STEMI.  We will check a chest x-ray to evaluate for any  infiltrate.  The patient is afebrile.  Lastly he is hyperglycemic and we  will check a hemoglobin A1c.  He will be made n.p.o. after midnight and  gently hydrated with normal saline.  We will also place him on Mucomyst  in anticipation of possible left heart catheterization.      Brayton El, MD  Electronically Signed    SGA/MEDQ  D:  04/09/2009  T:  04/10/2009  Job:  [id number]

## 2011-02-09 NOTE — H&P (Signed)
[name], [name] NO.:  [id number]   MEDICAL RECORD NO.:  [id number]          PATIENT TYPE:  INP   LOCATION:  4715                         FACILITY:  [facility]   PHYSICIAN:  Peter M. Swaziland, M.D.  DATE OF BIRTH:  12-13-1931   DATE OF ADMISSION:  04/04/2008  DATE OF DISCHARGE:                              HISTORY & PHYSICAL   HISTORY OF PRESENT ILLNESS:  [name] is a pleasant 77 year old  white male with known history of coronary artery disease.  He had had  previous myocardial infarction 1988-1989.  He had multiple attempted  angioplasty, but ultimately failed due to restenosis.  He subsequently  had coronary artery bypass surgery in 1989.  This included a LIMA graft  to LAD and a sequential vein graft to the PDA and distal obtuse marginal  vessel.  In 2006, he underwent cardiac catheterization which  demonstrated his grafts were still patent.  In 2007, he presented with  myocardial infarction, non-Q-wave.  Coronary angiography at that time  showed severe disease in the vein graft to the PD and OM with thrombus.  He underwent repeat coronary bypass surgery by Dr. Tyrone Sage.  This  included redoing his sequential vein graft to the PDA and to the obtuse  marginal vessel, and he also grafted intermediate branch or diagonal  branch.  The LIMA was left in place.  The patient subsequently presented  with recurrent angina and had an abnormal Cardiolite study in May 2008.  This led to cardiac catheterization which demonstrated occlusion of his  vein grafts both to the intermediate and the sequential vein graft to  the PDA and OM branches.  He subsequently underwent stenting of his old  vein graft to the PDA using a 3.5 x 28-mm Cypher stent and postdilating  up to 4 mm.  The patient had an excellent angiographic result.  He also  had stenting of the first diagonal branch using a 2.5 x 80-mm Cypher  stent.  Following this procedure, the patient did well but recently  presented with recurrent chest pain.  He complains of lack of energy.  He has more pressure in his chest and pain.  It does seem to be worse  with exertion but may occur at rest as well.  He describes a burning  sensation across his chest from left-to-right.  This may last anywhere  from 10 seconds to 3 minutes, and his predominant symptoms' lack of  energy.  The patient subsequently underwent a repeat stress Cardiolite  study.  He was able to exercise for 8 minutes on the Bruce protocol.  He  did develop chest pain at peak exercise and dyspnea.  He had marked ST  depression in the lateral precordial leads up to 5 mm.  Subsequent  Cardiolite images demonstrated a fixed inferolateral and mid anterior  wall defects and a reversible inferior septal wall defect.  His ejection  fraction was down to 30%.  Given these findings, he is now admitted for  cardiac catheterization.  On his free cath lab work, his creatinine had  increased to 2.1 from a baseline of 1.45.  So, he is admitted for  overnight hydration and Mucomyst therapy.   PAST MEDICAL HISTORY:  1. Coronary artery disease status post redo coronary artery bypass      surgery as noted above.  2. Status post stenting of the vein graft to the PDA and stenting of      the native diagonal branch in June 2008.  3. Hyperlipidemia.  4. Hypertension.  5. Gout.  6. History of TIA in 2000.  7. Status post left carotid endarterectomy 1992.  8. Prior back surgery.  9. History of prostate cancer status post radioactive seed implant,      2005.  10.History of right carotid endarterectomy in 2007.  11.History of right knee arthroscopy.   CURRENT MEDICATIONS:  Include:  1. Furosemide 10 mg per day.  2. Metoprolol 50 mg b.i.d.  3. Aspirin daily.  4. Allopurinol 300 mg per day.  5. Lipitor 80 mg per day.  6. Fosinopril/HCT 20/12.5 mg daily.  7. Amlodipine 10 mg per day.  8. Plavix 75 mg per day.  9. Fenofibrate 67 mg daily.   ALLERGIES:  The  patient has no known allergies.   SOCIAL HISTORY:  Quit smoking in 1988 and previously had 30 pack-year  history of smoking.  He has 3 children in good health.  He is married.  He is retired from [employer].  His diet is poor, and he  states he is a meat-and-potato man.   FAMILY HISTORY:  Father died of cerebral hemorrhage at age 27.  Mother  died of cancer at age 57.  One sister aged 19 is alive and well.   REVIEW OF SYSTEMS:  The patient denies any recent TIA or stroke.  He has  had no bleeding problems.  He denies any significant claudication.  His  urine output has been good.  Other review of systems are negative.   PHYSICAL EXAMINATION:  GENERAL:  The patient is a pleasant white male in  no apparent distress.  VITAL SIGNS:  His weight is 194, blood pressure is 130/70, pulse 56 and  regular.  HEENT:  He is normocephalic, atraumatic.  Pupils equal, round, reactive  to light and accommodation.  Extraocular movements are full.  Oropharynx  is clear.  NECK:  Without JVD, adenopathy, thyromegaly, or bruits.  He has old  carotid endarterectomy scars.  LUNGS:  Clear.  CARDIAC:  Reveals a regular rate and rhythm without gallop or rub.  ABDOMEN:  Soft, nontender without masses or bruits.  EXTREMITIES:  Without edema.  Pulses were 2+ and symmetric.  NEUROLOGIC:  Nonfocal.   LABORATORY DATA:  His ECG at rest demonstrates normal sinus rhythm with  occasional PVCs and ST-T wave abnormality consistent with inferolateral  ischemia.  His glucose is 74, BUN 30, creatinine 2.1.  Electrolytes were  normal.  Coags were normal.  White count 7400, hemoglobin 12.2,  hematocrit 37.3, and platelets 336,000.   IMPRESSION:  1. Recurrent angina pectoris with an abnormal adenosine Cardiolite      study.  2. Coronary artery disease status post redo coronary artery bypass      surgery in December 2007 with subsequent early failure of his vein      grafts.  3. Status post intracoronary  stenting of the first diagonal branch in      the saphenous vein graft to the patent ductus arteriosus in June      2008.  4. Status bilateral carotid endarterectomy.  5. Renal insufficiency.  6. Hypertension.  7. Hyperlipidemia.  8. Gout.  9. Prior transient ischemic attack.   PLAN:  The patient will be treated with overnight hydration with IV  fluids.  We will give Mucomyst therapy.  We will hold his Lasix and  fosinopril/HCT.  We will plan on proceeding with cardiac catheterization  with possible intervention if indicated tomorrow.  We will continue his  other medications at this time.           ______________________________  Peter M. Swaziland, M.D.     PMJ/MEDQ  D:  04/04/2008  T:  04/05/2008  Job:  [id number]   cc:   Juline Patch, M.D.

## 2011-02-09 NOTE — Cardiovascular Report (Signed)
NAME:  [name] [name], [name] [name] NO.:  [id number]   MEDICAL RECORD NO.:  [id number]           PATIENT TYPE:   LOCATION:                                 FACILITY:   PHYSICIAN:  Peter M. Swaziland, M.D.  DATE OF BIRTH:  [birth year]   DATE OF PROCEDURE:  04/10/2009  DATE OF DISCHARGE:                            CARDIAC CATHETERIZATION   INDICATIONS FOR PROCEDURE:  The patient is a 77 year old white male with  known history of coronary disease status post redo bypass surgery in  2007.  He had early graft failure of his vein grafts.  He presented  recently with a small non-Q-wave myocardial infarction.   PROCEDURE:  Left heart catheterization, coronary and left ventricular  angiography, saphenous vein graft angiography, LIMA and left subclavian  angiography.   ACCESS:  Via the right femoral artery using the standard Seldinger  technique.   EQUIPMENT:  A 6-French 4-cm right and left Judkins catheter, 6-French  IMA catheter, 6-French pigtail catheter, 6-French arterial sheath.   MEDICATIONS:  Local anesthesia 1% Xylocaine, Versed 2 mg IV, fentanyl 25  mcg IV.   CONTRAST:  Omnipaque 140 mL.   HEMODYNAMIC DATE:  Aortic pressure was 126/58 with a mean of 82 mmHg,  left ventricular pressure was 128 with EDP of 17 mmHg.  At the end of  the procedure, his right groin was closed using an Angio-Seal device  with good hemostasis.   ANGIOGRAPHIC DATA:  1. The left coronary arises and distributes in a dominant fashion.      The left main coronary has mild disease less than or equal to 20%.  2. The left anterior descending artery has diffuse disease in the      proximal vessel up to 30-40%.  There is a 60% stenosis in the mid      LAD.  This is followed by an 80% stenosis after the second diagonal      branch.  The distal LAD fills by the IMA graft.  3. The first diagonal has a stent proximally that is widely patent.      There is less than 20% disease in the diagonal.  The second   diagonal is also widely patent.  4. The left circumflex coronary is occluded at its ostium.  There are      left-to-left collaterals to an intermediate branch and to the      obtuse marginal branch and distal PDA branch.  5. The right coronary artery is a small nondominant vessel.  It is      occluded proximally but has bridging collaterals.  6. The saphenous vein graft to the PDA is occluded.  We did not      perform angiogram of the vein graft to the intermediate since this      was already documented to be occluded.  The left subclavian artery      has a bulky eccentric stenosis proximally up to 40%.  There is no      pressure gradient across the stenosis.  The left internal mammary      artery graft is widely patent to  the LAD with good distal runoff.  7. Left ventricular angiography was performed in the RAO view.  This      demonstrates mild left ventricular enlargement.  There is akinesia      of the inferior base with global hypokinesia.  Overall, ejection      fraction is estimated at 35-40%.  There is no significant mitral      insufficiency.   FINAL INTERPRETATION:  1. Severe three-vessel obstructive coronary artery disease.  2. Long-term patent stent in the first diagonal branch.  3. Patent left internal mammary artery graft to the left anterior      descending.  4. Occluded vein grafts to the intermediate and posterior descending      artery branches.  5. Moderate left ventricular dysfunction.   PLAN:  There is no significant change compared to his prior cardiac  catheterization of 1 year prior.  We will continue medical therapy.           ______________________________  Peter M. Swaziland, M.D.     PMJ/MEDQ  D:  04/10/2009  T:  04/11/2009  Job:  [id number]

## 2011-02-09 NOTE — Cardiovascular Report (Signed)
NAMELAURIN, MORGENSTERN NO.:  [id number]   MEDICAL RECORD NO.:  [id number]          PATIENT TYPE:  OIB   LOCATION:  6523                         FACILITY:  [facility]   PHYSICIAN:  Peter M. Swaziland, M.D.  DATE OF BIRTH:  1932-07-11   DATE OF PROCEDURE:  03/09/2007  DATE OF DISCHARGE:                            CARDIAC CATHETERIZATION   INDICATIONS FOR PROCEDURE:  The patient is 77 year old, white male who  is status post coronary bypass surgery with a redo surgery in December  2007.  He presented with recurrent angina and abnormal Cardiolite study.  Follow-up cardiac catheterization demonstrated failure of his recent  vein grafts to the PDA and to the diagonal vessels.  His old vein graft  to the PDA was still patent, but had severe disease proximally.  He was  brought back for intervention of both the native diagonal and the vein  graft to the PDA.  Access via the right femoral artery using standard  Seldinger technique closure was obtained with Angio-Seal device with  excellent hemostasis.  Equipment used for the vein graft lesion was a 6-  [place], multipurpose catheter with side holes, a filter wire, a 3.5 x 20-  mm Maverick balloon, a 3.5 x 28-mm Cypher stent and a 4.0 x 20-mm  Quantum Maverick balloon.  For the diagonal, we used a 6-French left  Judkins four guide, a 0.014 high-torque floppy wire, a 2.25 x 15-mm  Maverick balloon, a 2.5 x 18-mm Cypher stent.   CONTRAST:  200 mL of Omnipaque.   MEDICATIONS:  Versed 2 mg IV, Angiomax bolus at 0.75 mg/kg followed by  continuous infusion of 1.75 mg/kg per hour, nitroglycerin 200 mcg  intracoronary x2.  After Angiomax load, ACT was 336 seconds.  The  patient was hemodynamically stable throughout the procedure.   PROCEDURE NOTE:  We initially proceeded with stenting of the vein graft  to the PDA.  This had a downward takeoff from the aorta.  This was  engaged with a Proofreader.  We used a filter wire, deploying  the  filter and in the mid-vein graft.  We subsequently predilated both the  ostial and proximal vessel lesions using a 3.5 x 20-mm Maverick balloon.  The ostial stenosis was 80% followed by 60% stenosis in the proximal  vessel.  After predilatation, we deployed a 3.5 x 28-mm Cypher stent  spanning the ostium and the proximal lesion.  This was deployed at 11  atmospheres and postdilated to 14 atmospheres.  We then postdilated the  entire stented segment using a 4.0 x 20-mm Quantum balloon post dilating  up to 14 atmospheres.  This yielded an excellent angiographic result  with 0% residual stenosis and TIMI-3 flow.  We next remove the filter  wire with the retrieval catheter.  This demonstrated no significant  atherosclerotic debris in the filter itself.   We next proceeded with stenting of the proximal diagonal branch.  Using  the left Judkins catheter, we performed guide shots.  This lesion was  crossed easily with a high-torque floppy wire.  We predilated the lesion  using a 2.25 x 15-mm  Maverick balloon dilating up to 6 atmospheres.  We  next deployed a 2.5 x 18-mm Cypher stent deploying this at 11  atmospheres and postdilating to 12 atmospheres.  This stent extended  from the ostium of the diagonal to the proximal vessel.  An excellent  angiographic result was obtained with 0% residual stenosis and TIMI-3  flow.  The patient tolerated the entire procedure well.   FINAL INTERPRETATION:  1. Successful intracoronary stenting of the saphenous vein graft to      the posterior descending artery.  2. Successful stenting of the native diagonal branch.           ______________________________  Peter M. Swaziland, M.D.     PMJ/MEDQ  D:  03/09/2007  T:  03/10/2007  Job:  [id number]   cc:   Juline Patch, M.D.  Sheliah Plane, MD

## 2011-02-12 NOTE — Cardiovascular Report (Signed)
[name] [name], [name] [name] NO.:  [id number]   MEDICAL RECORD NO.:  [id number]          PATIENT TYPE:  INP   LOCATION:  2914                         FACILITY:  [facility]   PHYSICIAN:  Colleen Can. Deborah Chalk, M.D.DATE OF BIRTH:  [birth year]   DATE OF PROCEDURE:  09/15/2006  DATE OF DISCHARGE:                            CARDIAC CATHETERIZATION   PROCEDURE:  Left heart catheterization with selective coronary  angiography, left ventricular angiography with angiography of the left  internal mammary artery and angiography of the saphenous vein graft to  the right coronary artery and left circumflex sequentially.   SITE OF ENTRY:  Percutaneous right femoral artery with AngioSeal.   CATHETERS:  A 6-French #4 curved Judkins right and left coronary cath, 6-  French pigtail ventriculographic catheter, 6-French internal mammary  graft catheter, 6-French right coronary artery bypass graft catheter.   CONTRAST:  Pure Omnipaque.   MEDICATIONS:  Given prior procedure, Valium 10 mg.   MEDICATIONS:  Given during procedure, Versed 2 mg IV.   COMMENT:  The patient tolerated the procedure well.   HEMODYNAMIC DATA:  The aortic pressure was 117/53, LV was 118/5-13.  There was no aortic valve gradient noted on pullback.   ANGIOGRAPHIC DATA:  1. Left main coronary artery tapered slightly distally.  2. Left anterior descending:  Left anterior descending had diffuse      disease and proximal calcification.  There was 30-50% narrowing      proximally.  There was competitive flow distally from the left      internal mammary graft.  There was a moderate-size diagonal vessel      which had an 80% proximal stenosis.  Distal vessel would be      suitable for bypass grafting.  3. Left circumflex:  Left circumflex is totally occluded proximally.      There was some very scanty flow into the first obtuse marginal.  It      is certainly not as prominent as it was one year ago at previous  catheterization.  The left circumflex is totally occluded      proximally with some bridging collaterals to that first OM but      there was no visualization of the distal portions of this obtuse      marginal.  4. Right coronary artery:  The right coronary artery is totally      occluded proximally.  There was limited bridging collaterals to the      midportion of the right coronary artery.  This vessel supplied an      insignificant portion of the heart.  5. Would be left internal mammary artery to the LAD:  There is      definite ostial narrowing in the left subclavian artery that would      at least be 40-50% in nature.  It was a prominent plaque.  The left      internal mammary artery supplied competitive flow into the distal      left anterior descending.  It was a satisfactory graft otherwise.  6. A saphenous vein graft to the posterior descending and distal  obtuse marginal vessel in a sequential manner:  The ostium of the      saphenous vein graft had a hood and filling seemingly was      incomplete but there appeared to be a globular filling defect      consistent with thrombus near the ostium of this saphenous vein      graft.  There was what appeared to be slow flow into the vein      graft.  The posterior descending vessel was totally occluded at the      anastomosis with vein graft.  The anastomotic site of the vein      graft into the posterior lateral branch was associated with 80-90%      anastomotic stenosis.  The distal obtuse marginal was relatively      small but would be suitable for redo surgery.   Left ventricular angiogram was performed in the RAO position.  Overall  correct size and silhouette are normal.  Global ejection fraction is  estimated to be at 50%.  There was inferior basilar hypokinesia.   OVERALL IMPRESSION:  1. Mild left ventricular dysfunction with inferior basilar      hypokinesia.  2. Severe three-vessel coronary disease with totally  occluded right      coronary artery, totally occluded left circumflex, severe lesion in      the diagonal vessel of the left anterior descending and moderate      diffuse disease in the proximal left anterior descending.  3. Patent left internal mammary graft to the left anterior descending      artery associated with proximal subclavian stenosis; patent      saphenous vein graft to the distal obtuse marginal but with a      diseased body of the graft but what appears to be an ostial filling      defect in the saphenous vein graft as well as total occlusion at      the origin of the posterior descending vessel.   In light of these findings, we will manage the patient with Integrilin  overnight and discuss the possible surgical options versus medical  management.  As active as Mr. [name] is, I would favor redo surgery.      Colleen Can. Deborah Chalk, M.D.  Electronically Signed     SNT/MEDQ  D:  09/15/2006  T:  09/15/2006  Job:  [id number]   cc:   Peter M. [place], M.D.

## 2011-02-12 NOTE — Op Note (Signed)
NAME:  [name] [name], [name] [name]                       ACCOUNT NO.:  [id number]   MEDICAL RECORD NO.:  [id number]                   PATIENT TYPE:  AMB   LOCATION:  NESC                                 FACILITY:  [facility]   PHYSICIAN:  Excell Seltzer. Annabell Howells, M.D.                 DATE OF BIRTH:  1932-09-10   DATE OF PROCEDURE:  05/12/2004  DATE OF DISCHARGE:                                 OPERATIVE REPORT   PROCEDURE:  I-125 prostate seed implantation.   PREOPERATIVE DIAGNOSIS:  Prostate cancer.   POSTOPERATIVE DIAGNOSIS:  Prostate cancer.   SURGEON:  Excell Seltzer. Annabell Howells, M.D.   ANESTHESIA:  General.   COMPLICATIONS:  None.   DRAINS:  Foley catheter.   INDICATIONS:  [name] [name] is a 77 year old white male who was found to have  a prostate nodule and elevated PSA of 4.8.  Biopsy demonstrated Gleason's 6  adenocarcinoma of the prostate involving 10% of the right lobe of the  prostate.  After discussing the treatment options, he has elected a seed  implantation.   FINDINGS AND PROCEDURE:  The patient is given routine preoperative  antibiotics.  He was taken to the operating room, where a general anesthetic  was induced.  He was placed in lithotomy position.  His genitalia ws prepped  with Betadine solution, and a 16 French Coude Foley catheter was inserted  without difficulty after lubricating the urethra with lidocaine jelly.  The  balloon was filled with 10 mL of dilute contrast.  He then underwent  placement of an OpSite drape over the scrotum.  The perineum was shaved.  A  red rubber rectal catheter was placed and the perineum was prepped with  Betadine spray.  The ultrasound probe was assembled, inserted, and secured  at the fixed base.  Dr. Dan Humphreys adjusted the probe until it was consistent  with the preoperative treatment plan.  Once the probe was in appropriate  position, the seed guide was placed on the ultrasound apparatus and drapes  were applied.  Fluoroscopy was then used to confirm  position of the probe  and the treatment plan was adjusted based on real-time observations.  Once  the patient was appropriately positioned and the plan adjusted, the prostate  was secured with stabilization needles.  The treatment was then delivered  using 22 needles with 63 seeds with I-125.  The placement of the seeds went  without incident.  After completion of the procedure, the stabilization  needles were removed along with the probe.  The Foley catheter was removed.  The genitalia was re-prepped.  The cystoscopy was performed with a 16 [place]  flexible scope.  Examination revealed no urethral abnormalities and intact  external sphincter.  The prostatic urethra was approximately 3 cm in length  with slight lateral lobe enlargement with  minimal obstruction.  There was  small amount of blood in the prostatic urethra.  No visible seeds or strands  were noted within the prostatic urethra at the bladder neck.  The bladder  itself was unremarkable without mucosal lesions.  The ureteral orifices were  unremarkable without mucosal lesions.  The ureteral orifices were  unremarkable.  The scope was then removed and a fresh 16 French Coude Foley  catheter was inserted without difficulty.  The balloon  was filled with 10 mL of sterile fluid and the catheter placed to straight  drainage.  The patient was taken down from lithotomy position, his  anesthetic was reversed.  He was moved to the recovery room in stable  condition.  There were no complications.                                               Excell Seltzer. Annabell Howells, M.D.    JJW/MEDQ  D:  05/12/2004  T:  05/12/2004  Job:  [id number]   cc:   Wynn Banker, M.D.  [address] [facility]  [place]  [place] [address]  Fax: [phone]   Juline Patch, M.D.  [address]  [place], [place] [address]  Fax: [phone]

## 2011-02-12 NOTE — Op Note (Signed)
[name] [name], KELTZ NO.:  [id number]   MEDICAL RECORD NO.:  [id number]          PATIENT TYPE:  INP   LOCATION:  2310                         FACILITY:  [facility]   PHYSICIAN:  Sheliah Plane, MD    DATE OF BIRTH:  1932/06/04   DATE OF PROCEDURE:  09/21/2006  DATE OF DISCHARGE:                               OPERATIVE REPORT   PREOPERATIVE DIAGNOSIS:  Coronary artery occlusive disease, recurrent.   POSTOPERATIVE DIAGNOSIS:  Coronary artery occlusive disease, recurrent.   SURGICAL PROCEDURE:  Redo coronary artery bypass grafting x3 with left  thigh endo vein harvesting, bypasses with vein to the intermediate,  sequential vein to the posterior descending and distal circumflex  coronary artery.   SURGEON:  Sheliah Plane, M.D.   FIRST ASSISTANT:  Rowe Clack, P.A.-C.   BRIEF HISTORY:  The patient is a 77 year old male who 19 years  previously had undergone coronary artery bypass grafting with mammary to  the LAD and sequential vein graft to the distal circumflex. He has done  well until the past several months; he had increasing anginal symptoms  until they have been unstable, and he was readmitted with his elevation  of his troponin. Cardiac catheterization revealed diffuse disease in the  vein graft and probable high grade stenosis at the distal anastomosis.  In addition, the posterior descending branch was now occluded which had  been opened when catheterized the year previously. The mammary was  patent. Because of the patient's ongoing intermittent chest pain while  in the hospital, the redo coronary artery bypass grafting was  recommended. The patient agreed and signed informed consent.   DESCRIPTION OF PROCEDURE:  With Swan-Ganz and arterial line monitoring  in place, the patient underwent general endotracheal anesthesia without  incident. Skin of the chest and legs were draped with Betadine and  draped in usual sterile manner. Using the guidant endo  vein harvesting  system, vein was harvested from the left thigh and was of good quality  and caliber.  Median sternotomy was performed with a sagittal saw,  taking care not to injure underlying structures Continued dissection of  the ascending aorta and right atrium were carried out. Using a Rultract  on the left, the left chest was elevated, and this assisted in  dissecting out and identifying the mammary artery as it came from the  chest to the anastomosis of the LAD. This was preserved, encircled with  a vessel loop for later placement of a bulldog while the cardioplegia  was being administered. Anterior heart, right atrium and ascending aorta  dissected out. The patient was systemically heparinized. Ascending aorta  was cannulated. Right atrium was cannulated. Retrograde cardioplegia  catheter was placed. Care was taken not to avoid any undue manipulation  of the diseased main graft. The patient was placed on cardiopulmonary  bypass, 2.4 liters per minute per m2. Remainder of the dissection was  carried out. The posterior descending coronary artery was identified.  Distal circumflex was identified. The intermittent coronary artery with  some difficulty was identified, as it laid behind the mammary artery.  The patient's body temperature was  cooled to 30 degrees. Aortic cross  clamp was applied. Five hundred mL of cold blood potassium cardioplegia  administered. Antegrade bulldog was placed on the mammary artery.  Retrograde cardioplegia was also administered. Attention was turned  first to the posterior descending coronary artery, which was opened,  admitted a 1 mm probe.  Using diamond-type, side-to-side anastomosis was  carried out. Distal extent of the same vein was then carried to the  distal circumflex which was opened and admitted a 1.5-mm probe. Using a  running 7-0 Prolene, distal anastomosis was performed. With more  difficulty, heart was elevated, and the intermediate  coronary artery was  identified, and the intermediate was opened and admitted 1-mm probe.  Vessel was approximately 1.2 to 1.3 mm in size.  Using a running 7-0  Prolene, a segment of the reversed saphenous vein graft was anastomosed  to the intermediate coronary artery. With cross clamps still in place,  the intermediate vein graft was anastomosis through a separate punch,  aortotomy in the ascending aorta, the vein graft to the posterior  descending and distal circumflex was anastomosed into the hood of the  old vein graft. Air was evacuated from the ascending aorta and the  heart. Aortic cross clamp was removed. Total cross clamp time was 81  minutes. Sites of anastomosis were inspected for any bleeding. Atrial  and ventricular pacing wires were applied.   The patient spontaneously converted a sinus rhythm. He was then  ventilated and weaned from cardiopulmonary bypass without difficulty and  remained hemodynamically stable. He was decannulated in usual fashion.  Protamine sulfate was administered. With the operative field hemostatic,  a single Blake drain was placed into the mediastinum. Some mediastinal  fat was pulled over the ascending aorta. There was little pericardium to  close over the right ventricle. A single Blake drain was left in placed.  Sternal wires were placed, and the sternum reapproximated with a #6  stainless steel wire. Fascia closed using interrupted 0 Vicryl, running  3-0 Vicryl in the subcutaneous tissue and 4-0 subcuticular stitch in the  skin edges. Dry dressings were applied. Sponge and needle count were  reported as correct at the completion of the procedure. Total pump time  was 129 minutes.      Sheliah Plane, MD  Electronically Signed     EG/MEDQ  D:  09/22/2006  T:  09/22/2006  Job:  [id number]

## 2011-02-12 NOTE — Op Note (Signed)
NAME:  [name] [name], [name] [name]                      ACCOUNT NO.:  [id number]   MEDICAL RECORD NO.:  [id number]                   PATIENT TYPE:  INP   LOCATION:  2899                                 FACILITY:  [facility]   PHYSICIAN:  Di Kindle. Edilia Bo, M.D.        DATE OF BIRTH:  July 08, 1932   DATE OF PROCEDURE:  03/26/2004  DATE OF DISCHARGE:                                 OPERATIVE REPORT   PREOPERATIVE DIAGNOSIS:  Asymptomatic greater than 90% right carotid  stenosis.   POSTOPERATIVE DIAGNOSIS:  Asymptomatic greater than 90% right carotid  stenosis.   PROCEDURE:  Right carotid endarterectomy with Dacron patch angioplasty.   SURGEON:  Di Kindle. Edilia Bo, M.D.   ASSISTANT:  Toribio Harbour, N.P.   ANESTHESIA:  General.   INDICATIONS:  This is a 76 year old gentleman who was found to have a right  carotid bruit.  This prompted a Duplex scan which showed a greater than 80%  right carotid stenosis.  The patient was being worked up for prostate cancer  and it was recommended that he undergo repair of his carotid first, prior to  undergoing surgery for his prostate malignancy.  The procedure and potential  complications including but not limited to bleeding, stroke (perioperative  risk 1% to 2%), nerve injury, MI, or other unpredictable medical problems  were discussed with the patient, all of his questions were answered and he  is agreeable to proceed.   TECHNIQUE:  The patient was taken to the operating room and received a  general anesthetic.  The right neck was prepped and draped in the usual  sterile fashion.  An incision was made along the anterior border of the  sternocleidomastoid and the dissection carried down to the common carotid  artery, which was dissected free and controlled with a Rumel tourniquet.  The facial vein was divided between 2-0 silk ties.  The internal carotid  artery above the plaque was controlled with a blue Vesseloop and the  external carotid  artery was controlled with a blue Vesseloop.  The superior  thyroid artery was controlled with a 2-0 silk tie.  The patient was then  heparinized.  Clamps were then placed on the internal, then the common, then  the external carotid artery.  A longitudinal arteriotomy was made in the  common carotid artery and this was extended through the plaque into the  internal carotid artery above the plaque.  A 10 shunt was placed into the  internal carotid artery, back-bleed and then placed into the common carotid  artery and secured with a Rumel tourniquet.  Flow was reestablished to the  shunt.  An endarterectomy plane was established proximally and the plaque  was sharply divided.  Eversion endarterectomy was performed of the external  carotid artery.  Distally, there was a nice taper in the plaque and no  tacking sutures were required.  The artery was irrigated with copious  amounts of heparin and dextran and all loose  debris removed.  A patent  Dacron patch was then sewn using continuous 6-0 Prolene suture.  Prior to  completing the patch closure, the shunt was removed and vessels back-bled  and flushed appropriately and the anastomosis completed.  Flow was  reestablished first to the external carotid artery and then to the internal  carotid artery.  At the completion, there was a good pulse distal to the  patch and good Doppler signal.  Hemostasis was obtained in the wound and the  heparin was partially reversed with protamine.  The wound was closed with a  deep layer of 3-0 Vicryl.  The platysma was closed with running 3-0 Vicryl.  The skin was closed with a 4-0 subcuticular stitch.  Sterile dressing was  applied.  The patient tolerated the procedure well and was transferred to  the recovery room in satisfactory condition.  All needle and sponge counts  were correct.                                               Di Kindle. Edilia Bo, M.D.    CSD/MEDQ  D:  03/26/2004  T:  03/26/2004   Job:  [id number]   cc:   Wynn Banker, M.D.  [address] [facility]  [place]  [place] [address]  Fax: [phone]   Juline Patch, M.D.  [address]  [place], [place] [address]  Fax: [phone]   Aram Candela. Aleen Campi, M.D.  [address]  [place]  [place] [address]  Fax: [phone]

## 2011-02-12 NOTE — Consult Note (Signed)
[name] [name], SANGIOVANNI NO.:  [id number]   MEDICAL RECORD NO.:  [id number]          PATIENT TYPE:  INP   LOCATION:  2914                         FACILITY:  [facility]   PHYSICIAN:  Sheliah Plane, MD    DATE OF BIRTH:  Jun 15, 1932   DATE OF CONSULTATION:  09/16/2006  DATE OF DISCHARGE:                                 CONSULTATION   REQUESTING PHYSICIAN:  Colleen Can. Deborah Chalk, M.D.   FOLLOW-UP CARDIOLOGIST:  Peter M. Swaziland, M.D.   PRIMARY CARE PHYSICIAN:  Juline Patch, M.D.   REASON FOR CONSULTATION:  Acute myocardial infarction, recurrent  coronary occlusive disease.   HISTORY OF PRESENT ILLNESS:  Patient is a 77 year old male who had  undergone coronary artery bypass grafting x3 with a left internal  mammary to the left anterior descending coronary artery and a sequential  reverse saphenous vein graft to the PD and distal circumflex of the PD  arising from the distal circumflex in 1989.  The patient had done well  until 2006 when he had some vague symptoms and underwent repeat cardiac  catheterization, which showed patent grafts.  The patient for the past  three months has had intermittent burning in his chest with exertion and  some generalized fatigue.  It is nonradiating pain.  On the day of  admission, December 19, he had a prolonged episode of chest pain and  came to the emergency room.  He subsequently had an elevated troponin of  2.62, and CK was 222, MB 11.8.  Troponin peaked at 1.6.  CK-MB at 14.6.  Patient became painfree and underwent cardiac catheterization yesterday  by Dr. Deborah Chalk.  Coronary artery bypass grafting consult was requested  by cardiology.  Cardiac risk factors include hypertension, treated  hyperlipidemia, history of diabetes, remote smoking history but quit in  1988.  Has no family history of coronary disease.  His father did die of  a cerebral hemorrhage at age 75.  Mother died in her 40s of carcinoma.  The patient denies any previous  stroke but has had episodes of TIAs x2  in 2000 which effected the right face.  He has known cerebrovascular  disease, having had bilateral carotid endarterectomies, one on the left  side in 1989 and the right side in 2005.   PAST SURGICAL HISTORY:  The patient had knee surgery on November 29th,  approximately three weeks ago on the right knee.  CAB in 1989, as noted  above.  Left carotid endarterectomy in 1989.  Right carotid  endarterectomy by Dr. Durwin Nora in 2005.  Back surgery in 1992.  Seed  implant for prostate carcinoma in 1995.   SOCIAL HISTORY:  The patient is married.  He is retired. Works one day a  week at [employer].   MEDICATIONS:  Patient has not been on Plavix.  He has been taking two  Aleve p.o. b.i.d. for the past week because of his knee surgery.  Other  medications include Micardis/HCT 80/25.  He takes 1 tablet in the  morning, 1/2 tablet at night.  Lopressor 50 p.o. b.i.d.  Sular 40 mg  q.a.m.  Allopurinol 300 mg a day.  Tricor 145 mg a day.  Aspirin 325 mg  a day.  Pravachol 40 mg a day.  Zetia 10 mg a day.  Patient is currently  on IV heparin, nitroglycerin, and Integrilin.   DRUG ALLERGIES:  None known.   REVIEW OF SYSTEMS:  CARDIAC:  Chest pain.  Denies resting shortness of  breath, exertional shortness of breath, palpitations, syncope,  presyncope, or orthopnea.  He denies lower extremity edema.  GENERAL:  Patient denies any constitutional symptoms.  Denies fever, chills, or  night sweats.  He has had pain in his right knee from the recent  surgery, but it is healing well without evidence of infection.  He has  had a history of prostate cancer but currently has no symptoms or  difficulty urinating.  He denies psychiatric history.   PHYSICAL EXAMINATION:  VITAL SIGNS:  Height 6 feet, 1 inch tall, 193  pounds.  Blood pressure 153/95, pulse 61.  He is in sinus rhythm.  Patient has bilateral carotid endarterectomy incisions.  Do not  appreciate any carotid  bruits.  LUNGS:  Clear bilaterally.  CARDIAC:  Regular rate and rhythm without murmur or gallop.  ABDOMEN:  Without palpable masses or tenderness.  EXTREMITIES:  The right leg is without swelling or tenderness.  The left  leg is without swelling.  He has 2+ DP/PT pulses bilaterally.   Cardiac catheterization films are reviewed from 2006 and the most recent  one.  Patient has ejection fraction of 50%.  The circumflex is totally  occluded.  The first OM in the distal circ off the vein graft around the  right side of the heart.  The right coronary artery is totally occluded,  small nondominant vessel vein graft that had previously gone to the PD  and then to the distal circumflex.  Shows the PD branch has occluded  since the catheterization one year ago.  There appears to be a filling  defect and slow flow throughout the graft with anastomotic stenosis.  The left internal mammary artery is patent.  The first diagonal is a  relatively small vessel with an 80% __________.   After reviewing the films and in discussion with Dr. Swaziland, an almost  75 year old vein graft, which the body of the graft appears to be  diffusely diseased and now with what appears to be a stenosis at the  distal anastomosis and loss of the side-to-side branch to the PD.  In  the patient's presentation with a significant amount of inferolateral  wall at jeopardy.  Redo in a patient who otherwise is at relatively good  risk, as far as his overall medical condition.  I agree with  recommendation to proceed with the redo coronary artery bypass grafting.  The risks and options have been discussed with the patient in detail,  including the risk of death, infection, stroke, myocardial infarction,  bleeding, blood transfusion.  He is agreeable and willing to proceed.  Will tentatively arrange the schedule for Wednesday, December 26th.  He  will need to remain in the hospital until that time.  The patient and his wife have  had their questions answered.      Sheliah Plane, MD  Electronically Signed     EG/MEDQ  D:  09/16/2006  T:  09/16/2006  Job:  [id number]   cc:   Juline Patch, M.D.  Peter M. Swaziland, M.D.  Colleen Can. Deborah Chalk, M.D.

## 2011-02-12 NOTE — Cardiovascular Report (Signed)
[name] [name], GELL NO.:  [id number]   MEDICAL RECORD NO.:  [id number]          PATIENT TYPE:  OIB   LOCATION:  1961                         FACILITY:  [facility]   PHYSICIAN:  Peter M. Swaziland, M.D.  DATE OF BIRTH:  12/13/1931   DATE OF PROCEDURE:  08/27/2005  DATE OF DISCHARGE:                              CARDIAC CATHETERIZATION   INDICATIONS FOR PROCEDURE:  77 year old white male status post coronary  bypass surgery in 1989 who presents with abnormal stress test clinically.   PROCEDURES:  1.  Left heart catheterization.  2.  Coronary and left ventricular angiography.  3.  Saphenous vein graft angiography x1.  4.  Left internal mammary artery graft angiography.  5.  Angiography of the left subclavian artery.   ACCESS:  Via the right femoral artery using standard Seldinger technique.   EQUIPMENT:  4-French 4 cm right and left Judkins catheter, 4-French pigtail  catheter, 4-French multipurpose catheter, and 4-French LIMA catheter.   CONTRAST:  150 mL of Omnipaque.   HEMODYNAMIC DATA:  Aortic pressure is 152/65 with a mean of 98 mmHg.  Left  ventricle pressure is 150 with an EDP of 22 mmHg.   ANGIOGRAPHIC DATA:  The left coronary artery arises and distributes  normally.  The left main coronary artery appears normal.   The left anterior descending artery has 40% disease proximally followed by  40-50% disease in the mid vessel.  The first diagonal branch has 70%  stenosis proximally.   The left circumflex coronary artery has a 95% stenosis proximal to the first  obtuse marginal vessel which is a small branch.  The circumflex is then  occluded after this marginal branch.   The right coronary artery has a total occlusion proximally with bridging  collaterals to the mid vessel.   The left subclavian artery has bulky 40% stenosis proximally followed by a  significantly ulcerated plaque with approximately 30% stenosis.   The LIMA graft the LAD is widely  patent.  There is significant competitive  flow down the native vessel.   There is a saphenous vein graft that supplies both the PDA and distal left  circumflex/obtuse marginal branch.  This graft arises anteriorly and then  has an inferior takeoff.  There is an eccentric 40% stenosis in the proximal  vein graft.  Otherwise, the graft is widely patent with excellent runoff.   FINAL INTERPRETATION:  1.  Severe two-vessel obstructive atherosclerotic coronary artery disease.  2.  Patent left internal mammary artery graft to left anterior descending.  3.  Patent saphenous vein graft sequentially to the posterior descending      artery and to the distal obtuse marginal vessel.  4.  Good left ventricular function.  Left ventricular angiography      demonstrates mild inferior wall hypokinesia with overall ejection      fraction approximately 55%.  5.  Ulcerated, but non-obstructive disease in the left subclavian artery.   PLAN:  Would recommend continued medical therapy.           ______________________________  Peter M. Swaziland, M.D.     PMJ/MEDQ  D:  08/27/2005  T:  08/27/2005  Job:  [id number]   cc:   Juline Patch, M.D.  Fax: [phone]   Di Kindle. Edilia Bo, M.D.  [address]  [place]  [place] [address]   Lemmie Evens, M.D.  Fax: [phone]

## 2011-02-12 NOTE — H&P (Signed)
NAMEYUJI, WALTH NO.:  [id number]   MEDICAL RECORD NO.:  [id number]          PATIENT TYPE:  INP   LOCATION:  2914                         FACILITY:  [facility]   PHYSICIAN:  Colleen Can. Deborah Chalk, M.D.DATE OF BIRTH:  Oct 14, 1931   DATE OF ADMISSION:  09/14/2006  DATE OF DISCHARGE:                              HISTORY & PHYSICAL   SOCIAL HISTORY:  [name] [name] is a 77 year old white male who has been  married 41 years.  He retired from [employer].  He was a 61-  year smoker until 14.  No alcohol.  He has three children, ages 100, 88  and 30.  He has four grandchildren.   HISTORY OF PRESENT ILLNESS:  He was in good health until he had a  myocardial infarction in 1988.  He noted that he was in the shower when  that happened.  Other MIs occurred in 1989 after multiple attempts at  angioplasty apparently when he was in [place].  This subsequently lead  to coronary artery bypass grafting in 1989.  His last catheterization  was in December of 2006.  That catheterization showed a patent saphenous  vein graft that was sequentially placed to the posterior descending and  to the distal obtuse marginal vessel and a patent left internal mammary  graft to the LAD.  He had well-preserved left ventricular function with  mild inferior wall hypokinesia.  There is an ulcerated but non-  obstructive disease in the left subclavian artery.  He had a totally  occluded right coronary artery with bridging collaterals.  He had a 95%  stenosis in the proximal left circumflex and 40% disease in the left  anterior descending artery and the first diagonal vessel had a 70%  stenosis proximally.   Three months ago, he began having 15-second episodes of burning chest  discomfort with exertion.  There was relief quickly and it was somewhat  inconsistent and it did not occur with exercise at all times.  Today, he  had much worse discomfort with a burning chest discomfort that came  and  went on two different occasions.  The first happened about 3:30 and  lasted only for one or two minutes.  He had a second episode at 5:45 PM  that lasted about an hour and was relieved when he arrived at the [facility] ER.  Apparently he had easing after nitroglycerin.  There was no  nausea and vomiting or diaphoresis.  He was not short of breath.  He has  less pain apparently with standing, more with sitting.   ALLERGIES:  NO KNOWN DRUG ALLERGIES.   MEDICATIONS:  Current medications:  1. Aspirin 325 mg per day.  2. Micardis 80/25 one tablet in the AM and one half-tablet in the PM      each day.  3. Metoprolol 50 mg b.i.d.  4. Sular 40 mg per day.  5. Allopurinol 300 mg q.a.m.  6. Tricor 145 mg per day.  7. Pravastatin 40 mg per day.  8. Zetia 10 mg per day.  9. P.r.n. Tylenol.   PAST MEDICAL HISTORY:  He has a history of gout, hypertension.  At one  time he was diagnosed with rheumatoid arthritis, but it is felt to  probably be a gouty origin to that.  He has had hyperlipidemia and a  history of TIAs in 2000.  Neurosurgeries in March of 1989.  He had  coronary artery bypass grafting times three in November of 1989 and he  had a left carotid endarterectomy in 1992.  He had back surgery with  disk surgery in 2005.  He had prostate seed implant in 2005.  He had a  right carotid endarterectomy on August 25, 2006.  He had right knee  arthroscopy.   FAMILY HISTORY:  Father died of cerebral hemorrhage at age 46.  Mother  died of cancer at 11.  One sister, age 38, is alive and well but had a  history of rheumatoid fever.  There are three children who are alive and  well.   REVIEW OF SYSTEMS:  Essentially negative.  GI and GU are negative.  He  is not short of breath.  He bowls and plays golf regularly.  He has not  had problems with the arthroscopy of his knee.   PHYSICAL EXAMINATION:  GENERAL:  He is a pleasant white male in no acute  distress.  VITAL SIGNS:  Weight is  193.  Blood pressure 165/81.  Heart rate is 60.  HEENT:  Negative.  NECK:  Supple.  LUNGS:  Clear.  HEART:  Shows a regular rate and rhythm.  ABDOMEN:  Soft, nontender.  EXTREMITIES: Without edema.  Peripheral pulses are 2+.   EKG showed old inferior myocardial infarction, LVH.   IMPRESSION:  1. Unstable angina.  2. Hyperlipidemia.  3. Status post coronary artery bypass grafting.  4. History of right knee arthroscopy on August 25, 2006.   PLAN:  We will place him on heparin, IV nitroglycerin.  Rule out  myocardial infarction.  Consider cardiac catheterization.  We will also  check a D-dimer.  It may be helpful if it is totally negative.      Colleen Can. Deborah Chalk, M.D.  Electronically Signed     SNT/MEDQ  D:  09/14/2006  T:  09/15/2006  Job:  [id number]   cc:   Peter M. Swaziland, M.D.

## 2011-02-12 NOTE — H&P (Signed)
NAME:  [name] [name], [name] [name] NO.:  [id number]   MEDICAL RECORD NO.:  [id number]           PATIENT TYPE:   LOCATION:                                 FACILITY:   PHYSICIAN:  Peter M. Swaziland, M.D.  DATE OF BIRTH:  Sep 22, 1932   DATE OF ADMISSION:  08/27/2005  DATE OF DISCHARGE:                                HISTORY & PHYSICAL   HISTORY OF PRESENT ILLNESS:  [name] is a pleasant 77 year old white  male with a history of coronary artery disease.  He is status post coronary  artery bypass surgery in 1989.  This included a LMA graft to the LAD and a  sequential saphenous vein graft to a distal marginal branch and to the PDA.  He has done extremely well since that time.  He apparently had a cardiac  catheterization approximately four or five years ago in the [facility]  area.  The results of that are unknown.  Recently, he underwent evaluation  with a stress Cardiolite study.  During the exercise portion of the  examination, the patient walked 8 minutes and 35 seconds.  He complained of  right chest and shoulder pain, he become hypotensive and had 3 mm of ST  segment depression inferiorly and 2 mm of ST segment depression in V5 and  V6.  His subsequent Cardiolite images were fairly unimpressive, but did  suggest a defect in the inferior septal region at the base.  His ejection  fraction was normal at 53%.  Because of his markedly abnormal exercise  response, it is recommended that he undergo cardiac catheterization at this  time.   PAST MEDICAL HISTORY:  1.  Coronary artery disease.  He apparently has suffered three prior      myocardial infarctions, had coronary artery bypass surgery in 1989.  2.  Carotid arterial disease, status post right carotid endarterectomy.  3.  Hypertension.  4.  Hypercholesterolemia.  5.  History of bradycardia.  6.  History of elevated liver function studies, on cholesterol-lowering      therapy.  7.  History of rheumatoid arthritis.  8.   History of TIA.  9.  Gout.  10. Remote carotid endarterectomy on the left in 1989.   CURRENT MEDICATIONS:  1.  Allopurinol 300 mg daily.  2.  Sular 40 mg b.i.d.  3.  Aspirin 325 mg daily.  4.  Folic acid 1 mg daily.  5.  Methotrexate 2.5 mg four pills once a week.  6.  Lipitor 40 mg daily.  7.  Micardis 80/25 mg daily.  8.  Metoprolol 50 mg b.i.d.  9.  Colchicine 0.6 mg daily.   SOCIAL HISTORY:  The patient is married.  He is retired.  He denies tobacco  or alcohol use.   ALLERGIES:  No known drug allergies.   FAMILY HISTORY:  Noncontributory.   REVIEW OF SYSTEMS:  He has had no recent TIA or CVA symptoms.  He does note  some dyspnea and right chest discomfort with exertion.  He has had no  palpitations, dizziness, or syncope.  He denies any claudication symptoms at  this time.  Other review of systems are negative.   PHYSICAL EXAMINATION:  GENERAL:  The patient is a well-developed white male  in no distress.  VITAL SIGNS:  Weight is 199, blood pressure 150/80, pulse 60 and regular.  HEENT:  Normocephalic, atraumatic.  Pupils equal, round, reactive to light  and accommodation.  Extraocular movements are full.  Oropharynx is clear.  NECK:  Without JVD, adenopathy, thyromegaly, or bruits.  He has bilateral  carotid endarterectomy scars.  LUNGS:  Clear.  CARDIAC:  Regular rate and rhythm without gallop, murmur, rub, or click.  ABDOMEN:  Soft and nontender without masses or bruits.  EXTREMITIES:  Femoral and pedal pulses are 2+ and symmetric.  He has no  edema or cyanosis.  SKIN:  Warm and dry.  NEUROLOGIC:  Nonfocal.   LABORATORY DATA:  His resting ECG shows normal sinus rhythm with nonspecific  ST-T wave changes.  His chest x-ray shows no active disease.  Coag's, BMET,  and CBC are all normal.   IMPRESSION:  1.  Significantly abnormal stress Cardiolite study with clinical symptoms of      hypotension and ST abnormalities despite a relatively unremarkable      Cardiolite  scan.  2.  Remote coronary artery bypass surgery in 1989.  3.  Status post bilateral carotid endarterectomies.  4.  Hypertension.  5.  Hypercholesterolemia.  6.  Gout.  7.  Rheumatoid arthritis.   PLAN:  We will proceed with diagnostic cardiac catheterization with further  therapy pending these results.           ______________________________  Peter M. Swaziland, M.D.     PMJ/MEDQ  D:  08/23/2005  T:  08/23/2005  Job:  [id number]   cc:   Juline Patch, M.D.  Fax: [phone]   Di Kindle. Edilia Bo, M.D.  [address]  [place]  [place] [address]   Lemmie Evens, M.D.  Fax: [phone]

## 2011-02-12 NOTE — Discharge Summary (Signed)
NAMEALFONZA, TOFT NO.:  [id number]   MEDICAL RECORD NO.:  [id number]          PATIENT TYPE:  INP   LOCATION:  4715                         FACILITY:  [facility]   PHYSICIAN:  Peter M. Swaziland, M.D.  DATE OF BIRTH:  09-Oct-1931   DATE OF ADMISSION:  04/04/2008  DATE OF DISCHARGE:  04/06/2008                               DISCHARGE SUMMARY   HISTORY OF PRESENT ILLNESS:  [name] is a 77 year old white male  with extensive past medical history of coronary artery disease.  He has  had subsequent coronary artery bypass surgery in 1989 with redo coronary  artery bypass surgery in 2007.  Cardiac catheterization in May 2008  showed early vein graft failure.  He underwent stenting of old vein  graft to the PDA at that time.  He recently presented with recurrent  chest pain and complains of lack of energy.  He had a abnormal stress  Cardiolite study and was recommended he undergo cardiac catheterization  because he has chronic renal insufficiency with a baseline creatinine of  2.1.  He was admitted for pre-cath and post-cath hydration.  For details  of his past medical history, social history, family history, and  physical exam, please see admission history and physical.   LABORATORY DATA:  ECG showed normal sinus rhythm with first-degree AV  block and incomplete left bundle-branch block.  Chest x-ray showed left  basilar atelectasis, otherwise no active disease.  His white count was  7300, hemoglobin 12.6, hematocrit 37.2, and platelets 282,000.  Coags  were normal.  Sodium was 138, potassium 3.91, chloride 103, CO2 of 26,  BUN of 26, creatinine 1.44, and glucose was 97.   HOSPITAL COURSE:  The patient was admitted and begun on IV hydration.  He was given oral Mucomyst therapy.  On April 05, 2008, he underwent  cardiac catheterization.  This demonstrated patent LIMA graft to LAD.  However, all the vein grafts were now occluded including sequential vein  graft to the PDA  and obtuse marginal vessel and saphenous vein graft to  the diagonal.  The patient had a 57% stenosis in the mid LAD.  The left  circumflex coronary was a dominant vessel and was occluded proximally.  He had some mild left-to-right collaterals to the marginal and PDA.  The  right coronary was nondominant vessel, was occluded, and had bridging  collaterals.  Left ventricular angiography demonstrated inferior basal  akinesia with global hypokinesia.  Ejection fraction 35%.  Based on  these findings, we recommended medical therapy.  We recommended addition  of isosorbide and Ranexa to his medical therapy.  He was started on 60  mg of isosorbide and 500 mg twice per day of Ranexa.  He was continued  on his other medical therapy.  With hydration following day, BUN had  dropped to 17 and creatinine 1.29.  He had no significant cath  complications and discharged home on April 06, 2008.   DISCHARGE DIAGNOSES:  1. Crescendo angina.  2. Coronary artery disease status post redo coronary artery bypass      surgery in 2007 and previous stenting of  vein graft to the      posterior descending artery, now with occlusion of all vein grafts.      The patient has patent left internal mammary artery graft to the      left anterior descending.  3. Hyperlipidemia.  4. Hypertension.  5. Gout.  6. History of transient ischemic attack..  7. Status post left carotid endarterectomy.   DISCHARGE MEDICATIONS:  1. Fosinopril HCTZ 20/12.5 mg daily.  2. Amlodipine 10 mg per day.  3. Fenofibrate 67 mg per day.  4. Metoprolol 50 mg b.i.d.  5. Furosemide 10 mg per day.  6. Allopurinol 300 mg per day.  7. Lipitor 80 mg per day.  8. Plavix 75 mg per day.  9. Aspirin 325 mg per day.  10.Imdur 60 mg per day.  11.Ranexa 500 mg twice a day.  12.Sublingual nitroglycerin p.r.n.   The patient will follow up with Dr. Swaziland in approximately 2 weeks.  He  is to avoid any heavy lifting for 1 week.   DISCHARGE STATUS:   Improved.           ______________________________  Peter M. Swaziland, M.D.     PMJ/MEDQ  D:  05/09/2008  T:  05/10/2008  Job:  [id number]   cc:   Juline Patch, M.D.

## 2011-02-12 NOTE — Discharge Summary (Signed)
[name], [name] NO.:  [id number]   MEDICAL RECORD NO.:  [id number]          PATIENT TYPE:  INP   LOCATION:  2008                         FACILITY:  [facility]   PHYSICIAN:  Constance Holster, PA    DATE OF BIRTH:  [birth year]   DATE OF ADMISSION:  09/14/2006  DATE OF DISCHARGE:                               DISCHARGE SUMMARY   ATTENDING PHYSICIAN:  Dr. Sheliah Plane   CARDIOLOGIST:  Dr. Roger Shelter   ADMISSION DIAGNOSES:  1. Chest discomfort with exertion.  2. Unstable angina.   DISCHARGE DIAGNOSES:  1. Acute myocardial infarction.  2. Recurrent coronary occlusive disease status post re-do coronary      artery bypass grafting.  3. Hypertension.  4. Hyperlipidemia.  5. History of coronary artery bypass grafting in 1989.  6. Increase in liver function tests.  7. Rheumatoid arthritis.  8. History of transient ischemic attack.  9. Gout.  10.Prostate cancer.   CONSULTS:  On September 16, 2006, Dr. Sheliah Plane was consulted for  cardiothoracic surgery.   PROCEDURE:  1. On September 15, 2006, the patient underwent left heart      catheterization with selective coronary angiography, left      ventricular angiography with angiography of the left internal      mammary artery and angiography of the saphenous vein graft to the      right coronary artery and left circumflex sequentially by Dr.      Deborah Chalk.  2. On September 21, 2006, the patient underwent re-do coronary artery      bypass grafting x3 with left thigh endoscopic vein harvesting,      bypass of this vein to the intermediate, sequential vein to the      posterior descending and distal circumflex coronary artery by Dr.      Sheliah Plane.   HISTORY AND PHYSICAL:  Three months ago the patient began having 15-  second episodes of burning chest discomfort with exertion with relief  quickly and somewhat inconsistent and did not occur with all exercise  times.  On September 14, 2006, the patient  experienced worse discomfort  with the burning chest discomfort that came and went on 2 different  occasions.  This first happened about 3:30 and lasted only for 2  minutes.  The second episode happened at 5:45 and lasted an hour and was  relieved when he arrived to the [facility] ER.  The patient tried  nitroglycerin with relief.  There was no nausea, vomiting or  diaphoresis.  The patient denies shortness of breath.   The patient was in good health until he had a myocardial infarction in  1988.  Other myocardial infarctions occurred in 1989 after multiple  attempts at angioplasty apparently, when he was in [place].  The  patient subsequently led to coronary artery bypass grafting in 1989.  His last catheterization was December 2006 which showed a patent  saphenous vein graft that was sequentially placed to the posterior  descending and to the distal obtuse marginal vessel, and patent left  internal mammary artery graft to the LAD.  He  had a totally occluded  right coronary artery with bridging collaterals, 95% stenosis of the  proximal left circumflex and 40% disease in the left anterior descending  artery and the first diagonal branch vessel had 75% stenosis proximally.   HOSPITAL COURSE:  Upon coming to the emergency department, on September 14, 2006 the patient was admitted and started on intravenous heparin and  nitroglycerin.  On September 15, 2006 the patient was found to have a non-  Q wave myocardial infarction and was planned for a cardiac  catheterization on December 20.  Troponin 0.6 and rose to 1.6.  His CK  showed 22/11.8 to 213/14.6.  The patient did have some hypokalemia, and  this was replaced with potassium chloride.   On September 15, 2006 the patient underwent cardiac catheterization  without any complications.  Cardiac catheterization showed diffuse  disease in the vein graft and a probable high-grade stenosis of the  distal anastomosis.  In addition, the  posterior descending branch was  occluded.  The patient's mammary artery was patent.  Due to the  patient's results, Dr. Sheliah Plane was then consulted.   The patient was felt to be a surgical candidate and was set up for  coronary artery bypass grafting on September 21, 2006.  The patient did  have pre-surgery studies which showed no internal carotid artery  stenosis bilaterally.  He had antegrade vertebral artery flow  bilaterally.  His ABI was within normal limits bilaterally greater than  1.0.  Prior to the patient's surgery he had no further episodes of chest  pain.  The patient did experience angina on September 17, 2006.  His EKG  showed normal sinus rhythm with PVCs and inferior Q waves in leads III  and aVF.  The patient had increased ST depression laterally.  The  patient was continued on aggressive medical therapy.  After September 17, 2006 the patient did not have any further episodes of chest pain.  He  does have a history of mild renal insufficiency with a baseline  creatinine of 1.6.   On September 21, 2006 the patient underwent re-do coronary artery bypass  grafting x3 without any complications.  The patient's postoperative  course has been uneventful, and he has been progressing well as  expected.  On postoperative day #1 the patient was extubated and awake.  The patient's blood pressure was stable and he was A-paced.  The  patient's blood pressure has been about the 1-teens/70s.  He is on a low-  dose beta blocker.  The patient was started on Avapro on postoperative  #2; however, on postoperative day #4 his creatinine rose slightly and  again rose on postoperative day #5, and his Avapro was discontinued.  The patient's Lasix was decreased on postoperative #5 due to his  creatinine increasing to 1.8.  He continue gentle diuresis as tolerated.  The patient is responding to the Lasix well.   The patient has been maintaining normal sinus rhythm.  He was transferred to  2000 on postoperative day #2 without any complications.  He is walking with cardiac rehab with a steady gait.  The patient does  have some acute blood loss anemia postoperatively.  He did not require  any blood transfusion, and his hemoglobin and hematocrit have remained  stable.  He was started on Niferex and folic acid on postoperative day  #4.  On postoperative day #5 the patient's hemoglobin did drop from 9.6  to 8.7.  We will guaiac the patient's  stools x1.  He is asymptomatic.   The patient's creatinine rose from 1.6 to 1.8 on postoperative day #5,  and again his Lasix was decreased.  His Avapro was discontinued and the  patient's allopurinol was discontinued.  We will follow up his  creatinine in the morning.  The patient has been doing incentive  spirometry appropriately.   The patient's chest x-ray shows improved aeration and is stable.  He  will follow up the chest x-ray on September 26, 2006.   DISCHARGE DISPOSITION:  The patient will be discharged to home in good  health provided his creatinine improves and he is hemodynamically  stable.   MEDICATIONS:  1. Aspirin 325 mg p.o. daily.  2. Toprol XL 25 mg p.o. daily.  3. Pravachol 40 mg p.o. daily.  4. Niferex 150 mg daily.  5. Folic acid 1 mg p.o. daily.  6. Tricor 145 mg p.o. daily.  7. Lasix 20 mg p.o. daily.  8. Potassium chloride 10 mEq p.o. daily.  9. Zetia 10 mg p.o. daily.  10.Oxycodone 5 mg one to two tablets every four hours p.r.n.   INSTRUCTIONS:  The patient was instructed to follow a low-fat, low-salt  diet.  No driving or heavy lifting greater than 10 pounds.  The patient  is to ambulate 3-4 times daily and increase activity as tolerated.  He  is to continue with his breathing exercises.  He may shower and clean  his incisions with mild soap and water.  He is to call our office should  any problems arise.   FOLLOWUP:  The patient has a followup appointment with Dr. Deborah Chalk in 2  weeks.  He is to follow  up with Dr. Tyrone Sage in 3 weeks.  The office  will contact him with time and date of appointment.  The patient will  have a chest x-ray taken before seeing Dr. Deborah Chalk.      Constance Holster, PA     JMW/MEDQ  D:  09/25/2006  T:  09/26/2006  Job:  [id number]   cc:   Colleen Can. Deborah Chalk, M.D.

## 2011-02-12 NOTE — Consult Note (Signed)
NAME:  [name], [name]                      ACCOUNT NO.:  [id number]   MEDICAL RECORD NO.:  [id number]                   PATIENT TYPE:  INP   LOCATION:  3301                                 FACILITY:  [facility]   PHYSICIAN:  Peter M. Swaziland, M.D.               DATE OF BIRTH:  [birth year]   DATE OF CONSULTATION:  03/26/2004  DATE OF DISCHARGE:                                   CONSULTATION   HISTORY OF PRESENT ILLNESS:  [name] is a very pleasant 77 year old  white male with history of coronary artery disease.  He is status post  myocardial infarction x3.  He had attempted angioplasty x2 and then  ultimately had coronary artery bypass surgery x3 in 1989 while in [place].  He also had a left carotid endarterectomy at that time.  He has done well  since then without recurrent angina or shortness of breath.  He has been  active playing golf and bowling.  He had an adenosine Cardiolite study on  December 31, 2003 which showed no evidence of ischemia or infarct, but LV  ejection fraction was diminished at 45% with interseptal hypokinesia.  The  patient has a history of hypertension and hypercholesterolemia.  Today he  underwent a right carotid endarterectomy after he was found to have a  critical stenosis when a bruit was heard on exam.  This has been  asymptomatic.  Postoperatively he has been severely bradycardic with heart  rate in the 30's.  Telemetry has shown intermittent severe sinus bradycardia  with junctional escape beats.  There is no evidence of A-V block.  The  patient has had no dizziness or syncope.   PAST MEDICAL HISTORY:  1. ASCAD status post myocardial infarction x3, status post CABG in 1989.  2. Status post bilateral carotid endarterectomy on the left in 1989 and on     the right today.  3. Hypertension.  4. Hypercholesterolemia.  5. Rheumatoid arthritis.  6. Prostate carcinoma.  7. History of TIA's.  8. Gout.   MEDICATIONS:  1. Atenolol 50 mg daily.  2. Sular  20 mg b.i.d.  3. Hydrochlorothiazide daily.  4. Methotrexate.   ALLERGIES:  The patient has no known allergies.   FAMILY HISTORY:  Noncontributory.   SOCIAL HISTORY:  He is married; he is retired.  He denies any current  tobacco use or alcohol use.   REVIEW OF SYSTEMS:  The patient states his blood pressure has always been  difficult to control and is rarely below 140 systolic.  He also reports that  his Lipitor which he was on previously was held because of recent onset of  arthritic complaints but these symptoms have not resolved off of Lipitor and  subsequently he has been diagnosed as having rheumatoid arthritis.  His  review of systems are otherwise negative.   PHYSICAL EXAMINATION:  GENERAL:  The patient is a well-developed white male  in no  apparent distress.  VITAL SIGNS:  Blood pressure is 144/76, pulse 50 and regular, he is on  Dopamine IV.  He is afebrile.  HEENT EXAM:  Unremarkable.  NECK:  He has a dressing on his right neck.  LUNGS:  Clear.  CARDIAC EXAM:  Reveals regular rate and rhythm without murmurs, rubs, or  gallops.  ABDOMEN:  Soft, nontender.  EXTREMITIES:  Pulses are 2+ and symmetric, he has no edema.  NEUROLOGIC EXAM:  Nonfocal.   LABORATORY DATA:  His preop labs were all negative.  His ECG shows normal  sinus rhythm with old inferior infarction and LVH.   IMPRESSION:  1. Bradycardia, this is chronic, exacerbated by his surgery with increased     vagal tone.  Also exacerbated by chronic beta blocker therapy.  2. Status post right carotid endarterectomy.  3. ASCAD status post myocardial infarction x3, status post CABG.  4. Hypertension.  5. Hyperlipidemia.   PLAN:  We will discontinue Atenolol, will discontinue his Dopamine.  The  patient will continue on Sular and HCTZ.  Will recommend additional of  Lisinopril for blood pressure control.  The patient does get some drug  assistance for generic drugs so will work with his HCTZ and Lisinopril  until  were are on maximum dose and then if he needs additional therapy we can add  on to that.  He is scheduled for follow up cholesterol panel next week.  I  suspect he needs to go back on statin therapy given his history of extensive  vascular disease and will target an LDL level of 70.                                               Peter M. Swaziland, M.D.    PMJ/MEDQ  D:  03/26/2004  T:  03/27/2004  Job:  [id number]   cc:   Juline Patch, M.D.  [address]  [place], [place] [address]  Fax: [phone]   Lemmie Evens, M.D.  [address]  [place]  [place] [address]  Fax: [phone]   Excell Seltzer. Annabell Howells, M.D.  [address]  [place]  [place] [address]  Fax: [phone]   Wynn Banker, M.D.  [address] [facility]  [place]  [place] [address]  Fax: [phone]

## 2011-02-12 NOTE — Discharge Summary (Signed)
NAME:  [name] [name], [name] [name]                      ACCOUNT NO.:  [id number]   MEDICAL RECORD NO.:  [id number]                   PATIENT TYPE:  INP   LOCATION:  3301                                 FACILITY:  [facility]   PHYSICIAN:  Di Kindle. Edilia Bo, M.D.        DATE OF BIRTH:  [birth year]   DATE OF ADMISSION:  03/26/2004  DATE OF DISCHARGE:  03/27/2004                                 DISCHARGE SUMMARY   ADMITTING DIAGNOSIS:  Asymptomatic right carotid artery stenosis.   PAST MEDICAL HISTORY:  1. Recent diagnosis prostate cancer, radioactive implants for treatment are     pending.  2. Hypertension.  3. Coronary artery disease, status post myocardial infarction in 1988, 1989,     and revascularization in 1989, all in [place].  4. CVOD, status post left carotid endarterectomy, [place] in 1989.  5. Rheumatoid arthritis.   ALLERGIES:  This patient has no known drug allergies.   DISCHARGE DIAGNOSES:  1. Asymptomatic right carotid stenosis, status post right carotid     endarterectomy.  2. Marked severe bradycardia, resolved.   BRIEF HISTORY:  [name] is a 77 year old Caucasian man.  He was  referred to Dr. Edilia Bo following a carotid duplex scan at [facility] that showed a greater than 80% stenosis in the right  carotid artery.  The patient was asymptomatic.  He was evaluated by Dr.  Edilia Bo on March 18, 2004.  After examination of the patient and review of  all available records, Dr. Edilia Bo recommended proceeding with right carotid  endarterectomy for a stroke, risk reduction.  The procedure, risks and  benefits have all been discussed with [name], and he agreed to  proceed with surgery.   HOSPITAL COURSE:  On March 26, 2004, [name] was electively admitted to  [facility] under the care of Dr. Waverly Ferrari.  He underwent  the following surgical procedure:  A right carotid endarterectomy with  Dacron patch angioplasty.  He  tolerated the procedure well, returning to the  PACU in stable condition.  He was extubated immediately following surgery,  and awoke from anesthesia neurologically intact.  The afternoon of surgery,  he developed significant bradycardia with a heart rate of  37 to 40. The  monitor strip read third degree heart block at one point.  He remained  asymptomatic throughout this episode.  Cardiology consultation was  requested, and he was seen by Dr. Swaziland.  Dr. Swaziland reviewed the rhythm  strips and felt he did not have any heart block.  Instead, he had marked  sinus bradycardia with occasional junctional escape beats.  Dr. Swaziland did  recommend stopping his beta-blocker and instead initiating lisinopril for  blood pressure control.  He has no further severe bradycardia.  His heart  rate remained in the 50's.   On morning rounds, July 1, [name] reports feeling very comfortable.  His vital signs were stable.  His  blood pressure was 130/40.  Heart rate 55.  He is afebrile.  Room air saturations 96%.  He remained neurologically  intact.  His incision was healing well.  He was tolerating his diet without  nausea.  He was ambulating in the hall.  He tolerated his first does of  lisinopril without incident.  [name] was ready for discharge home the  morning of March 27, 2004.   CONDITION ON DISCHARGE:  Improved.   DISCHARGE MEDICATIONS:  He is to resume the following home medications:  1. HCTZ 25 mg daily.  2. Allopurinol 300 mg daily.  3. Sular 20 mg b.i.d.  4. Aspirin 325 mg daily.  5. Folic acid 1 mg daily.  6. Vitamin E 400 international units daily.  7. Methotrexate 2.5 mg four tablets weekly.  8. Prednisone 50 mg b.i.d. p.r.n.   NEW MEDICATIONS:  1. Lisinopril 20 mg daily.  2. For pain management, he may have Tylox one to two p.o. q.4h. p.r.n.   DISCHARGE INSTRUCTIONS:  He has been asked to refrain from any driving or  heavy lifting for the next two weeks.   DISCHARGE  DIET:  A heart-healthy diet.   WOUND CARE:  1. He is to shower daily starting July 2.  2. He is to clean his incision with soap and water.   FOLLOWUP:  1. Dr. Di Kindle. Edilia Bo would like to see him back in the [facility] office     on Wednesday, July 27, at 9:10 a.m.  2. Dr. Swaziland would also like to see him back in his office in approximately     two to three weeks.  He has been given the office phone number to arrange     an appointment.      Toribio Harbour, N.P.                  Di Kindle. Edilia Bo, M.D.    CTK/MEDQ  D:  03/27/2004  T:  03/28/2004  Job:  [id number]   cc:   Di Kindle. Edilia Bo, M.D.  [address]  [place]  [place] [address]   Peter M. Swaziland, M.D.  [address]  [place], [place] [address]  Fax: [phone]   Wynn Banker, M.D.  [address] [facility]  [place]  [place] [address]  Fax: [phone]   Juline Patch, M.D.  [address]  [place], [place] [address]  Fax: [phone]

## 2011-03-13 ENCOUNTER — Other Ambulatory Visit: Payer: Self-pay | Admitting: Cardiology

## 2011-03-15 NOTE — Telephone Encounter (Signed)
Med refill

## 2011-04-19 ENCOUNTER — Other Ambulatory Visit: Payer: Self-pay | Admitting: Cardiology

## 2011-04-19 MED ORDER — CLOPIDOGREL BISULFATE 75 MG PO TABS: 75.0000 mg | ORAL_TABLET | Freq: Every day | ORAL | Status: DC

## 2011-04-19 NOTE — Telephone Encounter (Signed)
Called needing a refill of the generic for Plavix Clopidogrel 75mg  [facility] [place] [phone]. Please call back. I have pulled the chart.

## 2011-04-19 NOTE — Telephone Encounter (Signed)
Called requesting refill on Plavix. [facility]

## 2011-05-21 ENCOUNTER — Other Ambulatory Visit: Payer: Self-pay | Admitting: Dermatology

## 2011-06-09 ENCOUNTER — Other Ambulatory Visit: Payer: Self-pay | Admitting: *Deleted

## 2011-06-09 DIAGNOSIS — I10 Essential (primary) hypertension: Secondary | ICD-10-CM

## 2011-06-09 MED ORDER — HYDRALAZINE HCL 25 MG PO TABS: 25.0000 mg | ORAL_TABLET | Freq: Two times a day (BID) | ORAL | Status: DC

## 2011-06-09 NOTE — Telephone Encounter (Signed)
escribe medication per fax request  

## 2011-06-19 ENCOUNTER — Encounter: Payer: Self-pay | Admitting: Cardiology

## 2011-06-24 LAB — BASIC METABOLIC PANEL
BUN: 17
BUN: 26 — ABNORMAL HIGH
CO2: 26
Calcium: 9
Creatinine, Ser: 1.29
Creatinine, Ser: 1.44
GFR calc non Af Amer: 48 — ABNORMAL LOW
GFR calc non Af Amer: 55 — ABNORMAL LOW
Glucose, Bld: 101 — ABNORMAL HIGH
Glucose, Bld: 97

## 2011-06-24 LAB — CBC
HCT: 36 — ABNORMAL LOW
MCHC: 34
MCV: 84.3
Platelets: 275
Platelets: 282
RDW: 14
RDW: 14
WBC: 7.1

## 2011-06-24 LAB — APTT: aPTT: 26

## 2011-06-24 LAB — PROTIME-INR: INR: 1.1

## 2011-07-02 ENCOUNTER — Encounter: Payer: Self-pay | Admitting: Cardiology

## 2011-07-09 ENCOUNTER — Ambulatory Visit: Payer: PRIVATE HEALTH INSURANCE | Admitting: Cardiology

## 2011-07-15 ENCOUNTER — Ambulatory Visit (INDEPENDENT_AMBULATORY_CARE_PROVIDER_SITE_OTHER): Payer: PRIVATE HEALTH INSURANCE | Admitting: Cardiology

## 2011-07-15 ENCOUNTER — Encounter: Payer: Self-pay | Admitting: Cardiology

## 2011-07-15 VITALS — BP 160/75 | HR 55 | Ht 73.0 in | Wt 185.8 lb

## 2011-07-15 DIAGNOSIS — I519 Heart disease, unspecified: Secondary | ICD-10-CM

## 2011-07-15 DIAGNOSIS — I2510 Atherosclerotic heart disease of native coronary artery without angina pectoris: Secondary | ICD-10-CM

## 2011-07-15 DIAGNOSIS — N189 Chronic kidney disease, unspecified: Secondary | ICD-10-CM

## 2011-07-15 DIAGNOSIS — E7800 Pure hypercholesterolemia, unspecified: Secondary | ICD-10-CM

## 2011-07-15 DIAGNOSIS — I10 Essential (primary) hypertension: Secondary | ICD-10-CM

## 2011-07-15 LAB — CBC
HCT: 37.2 — ABNORMAL LOW
MCV: 76.2 — ABNORMAL LOW
Platelets: 290
RDW: 19.6 — ABNORMAL HIGH
WBC: 7.1

## 2011-07-15 LAB — BASIC METABOLIC PANEL
BUN: 25 — ABNORMAL HIGH
Chloride: 107
Creatinine, Ser: 1.41
Glucose, Bld: 94
Potassium: 3.7

## 2011-07-15 LAB — CARDIAC PANEL(CRET KIN+CKTOT+MB+TROPI)
CK, MB: 9.3 — ABNORMAL HIGH
Relative Index: 8.5 — ABNORMAL HIGH
Troponin I: 0.38 — ABNORMAL HIGH

## 2011-07-15 NOTE — Patient Instructions (Signed)
Continue your current medications  I will see you again in 6 months.   

## 2011-07-15 NOTE — Progress Notes (Signed)
HPI Pelvis seen today for followup. He states he is doing very well. He has had no significant chest pain or shortness of breath. He denies any palpitations. He remains active exercising in the pool 3 days a week and playing golf once a week. His blood pressure readings at home have been very good with typical readings of 120 to 130 systolic. He has a history of coronary disease and is status post redo coronary bypass surgery in 2007. Cardiac catheterization in 2010 showed occlusion of the vein graft to the intermediate vessel and to the posterior descending artery. He has a patent LIMA graft to the LAD and moderate left ventricular dysfunction. No Known Allergies  Current Outpatient Prescriptions on File Prior to Visit  Medication Sig Dispense Refill  . allopurinol (ZYLOPRIM) 300 MG tablet Take 300 mg by mouth daily.        Marland Kitchen amLODipine (NORVASC) 10 MG tablet Take 10 mg by mouth daily.        Marland Kitchen aspirin 81 MG tablet Take 81 mg by mouth daily.        Marland Kitchen atorvastatin (LIPITOR) 40 MG tablet Take 40 mg by mouth daily.        . benazepril (LOTENSIN) 40 MG tablet Take 40 mg by mouth daily.        . clopidogrel (PLAVIX) 75 MG tablet Take 1 tablet (75 mg total) by mouth daily.  90 tablet  3  . co-enzyme Q-10 30 MG capsule Take 30 mg by mouth daily.        . ergocalciferol (VITAMIN D2) 50000 UNITS capsule Take 50,000 Units by mouth every 30 (thirty) days. Twice monthly      . folic acid (FOLVITE) 400 MCG tablet Take 400 mcg by mouth 2 (two) times daily.       . hydrALAZINE (APRESOLINE) 25 MG tablet Take 1 tablet (25 mg total) by mouth 2 (two) times daily.  60 tablet  5  . isosorbide mononitrate (IMDUR) 30 MG 24 hr tablet TAKE TWO TABLETS BY MOUTH EVERY DAY  60 tablet  4  . metFORMIN (GLUCOPHAGE) 500 MG tablet Take 1 tablet (500 mg total) by mouth daily.  30 tablet    . methotrexate (RHEUMATREX) 2.5 MG tablet Take 2.5 mg by mouth once a week. Caution:Chemotherapy. Protect from light. 4 tablets once a week       . metoprolol tartrate (LOPRESSOR) 25 MG tablet Take 12.5 mg by mouth 2 (two) times daily.          Past Medical History  Diagnosis Date  . CAD (coronary artery disease)     REDO CABG 2007. SUBSEQUENT OCCLUSION OF ALL VEIN GRAFTS.   Marland Kitchen HTN (hypertension)   . Hypercholesteremia   . Chronic renal insufficiency   . Left ventricular dysfunction     MODERATE  . Gout   . History of TIAs 2000  . Prostate cancer   . Carotid arterial disease     Past Surgical History  Procedure Date  . Coronary artery bypass graft 4098,1191  . Carotid endarterectomy 1992, 2007    LEFT, RIGHT  . Back surgery   . Radioactive seed implant 2005  . Knee arthroscopy     RIGHT KNEE  . Cardiovascular stress test 04-02-08    EF 30%    Family History  Problem Relation Age of Onset  . Other Father 28    CEREBRAL HEMORRHAGE  . Cancer Mother 39    History   Social History  . Marital  Status: Married    Spouse Name: N/A    Number of Children: 3  . Years of Education: N/A   Occupational History  . [employer] safety     retired   Social History Main Topics  . Smoking status: Former Smoker    Types: Cigarettes    Quit date: 09/27/1986  . Smokeless tobacco: Not on file  . Alcohol Use: No  . Drug Use: No  . Sexually Active:    Other Topics Concern  . Not on file   Social History Narrative  . No narrative on file    ROS  as noted in history of present illness.  All other systems were reviewed and are negative.  PHYSICAL EXAM BP 160/75  Pulse 55  Ht 6\' 1"  (1.854 m)  Wt 185 lb 12.8 oz (84.278 kg)  BMI 24.51 kg/m2 The patient is alert and oriented x 3.  The mood and affect are normal.  The skin is warm and dry.  Color is normal.  The HEENT exam reveals that the sclera are nonicteric.  The mucous membranes are moist.  The carotids are 2+ with a soft right carotid bruit. He has bilateral endarterectomy scars.  There is no thyromegaly.  There is no JVD.  The lungs are clear.  The chest  wall is non tender. There is an old median sternotomy scar.  The heart exam reveals a regular rate with a normal S1 and S2.  There are no murmurs, gallops, or rubs.  The PMI is not displaced.   Abdominal exam reveals good bowel sounds.  There is no guarding or rebound.  There is no hepatosplenomegaly or tenderness.  There are no masses.  Exam of the legs reveal no clubbing, cyanosis, or edema.  The legs are without rashes.  The distal pulses are intact.  Cranial nerves II - XII are intact.  Motor and sensory functions are intact.  The gait is normal.  Laboratory data:  Laboratory data reviewed from August of 2012 showed excellent lipid control.  ASSESSMENT AND PLAN

## 2011-07-15 NOTE — Assessment & Plan Note (Signed)
He has no significant anginal symptoms on medical therapy. We will continue with his current treatment.

## 2011-07-15 NOTE — Assessment & Plan Note (Signed)
Blood pressure reading is elevated today but his readings previously have been excellent. He is going to continue to monitor and let us know if it remains elevated.

## 2011-07-15 NOTE — Assessment & Plan Note (Signed)
He is well compensated on exam. We will continue with his beta blocker, ACE inhibitor, and hydralazine. He has no evidence of volume overload at this point.

## 2011-07-16 ENCOUNTER — Other Ambulatory Visit: Payer: Self-pay | Admitting: Cardiology

## 2011-07-16 MED ORDER — ISOSORBIDE MONONITRATE ER 30 MG PO TB24: 30.0000 mg | ORAL_TABLET | Freq: Two times a day (BID) | ORAL | Status: DC

## 2011-07-24 ENCOUNTER — Other Ambulatory Visit: Payer: Self-pay | Admitting: Cardiology

## 2011-07-26 ENCOUNTER — Other Ambulatory Visit: Payer: Self-pay

## 2011-09-06 ENCOUNTER — Telehealth: Payer: Self-pay | Admitting: Cardiology

## 2011-09-06 NOTE — Telephone Encounter (Signed)
Called stating he saw Dr. Ricki Miller 11/29. His BP was up 170/ at that visit. Dr. Ricki Miller suggested he stop Benazepril and start Benicar  40 mg x 1 week and monitor BP. States BP still in 169/68. He called him back on Friday 12/7 and was told to stop Benicar and restart Benazepril 40 mg. Today BP is 157/70. Advised him to call Dr. Ricki Miller back and see what his recommendations would be and if wants [place] to regulate BP then Mr. [name] can call us back. Advised Dr. Ricki Miller can regulate BP meds that does not need to be Dr. Sw[place]iland. Also states he has been under a lot of stress regarding his son. Will call us back if needed.

## 2011-09-06 NOTE — Telephone Encounter (Signed)
Called back stating he talked w/Dr. Ricki Miller and he referred him back to see Dr. Sw[place]iland. Advised him that I will talk w/Dr. Swaziland on Wed 12/12 and call him back

## 2011-09-06 NOTE — Telephone Encounter (Signed)
Fu call Pt wants to talk to you again please call

## 2011-09-06 NOTE — Telephone Encounter (Signed)
New msg Pt is calling about BP 171/76 please call him back

## 2011-09-08 ENCOUNTER — Telehealth: Payer: Self-pay | Admitting: *Deleted

## 2011-09-08 NOTE — Telephone Encounter (Signed)
F/up message:  Medication to increase he will need new prescription for 90 days with new dosage.  (25mg  tid)  [facility] [place] [phone]/  Please call this in for patient.

## 2011-09-08 NOTE — Telephone Encounter (Signed)
Spoke w/Dr. Swaziland regarding BP issues. Dr. Swaziland suggests increasing Hydralazine 25 mg to TID. He does have enough medication to increase dose. Also advised to continue monitoring BP.

## 2011-09-10 ENCOUNTER — Telehealth: Payer: Self-pay | Admitting: Cardiology

## 2011-09-10 MED ORDER — HYDRALAZINE HCL 25 MG PO TABS: 25.0000 mg | ORAL_TABLET | Freq: Three times a day (TID) | ORAL | Status: DC

## 2011-09-10 NOTE — Telephone Encounter (Signed)
Hydralazine 25mg  tid [facility] [place] 90 day supply, needs today please

## 2011-09-10 NOTE — Telephone Encounter (Signed)
escribe medication per fax request  

## 2011-09-17 ENCOUNTER — Other Ambulatory Visit: Payer: Self-pay | Admitting: *Deleted

## 2011-09-17 MED ORDER — NITROGLYCERIN 0.4 MG SL SUBL: 0.4000 mg | SUBLINGUAL_TABLET | SUBLINGUAL | Status: DC | PRN

## 2011-09-20 ENCOUNTER — Other Ambulatory Visit: Payer: Self-pay | Admitting: *Deleted

## 2011-09-20 MED ORDER — NITROGLYCERIN 0.4 MG SL SUBL: 0.4000 mg | SUBLINGUAL_TABLET | SUBLINGUAL | Status: DC | PRN

## 2011-10-06 ENCOUNTER — Other Ambulatory Visit: Payer: Self-pay | Admitting: *Deleted

## 2011-10-06 MED ORDER — ALLOPURINOL 300 MG PO TABS: 300.0000 mg | ORAL_TABLET | Freq: Every day | ORAL | Status: DC

## 2011-10-06 NOTE — Telephone Encounter (Signed)
Fax Received. Refill Completed. [name] [name] (R.M.A)   

## 2011-12-03 ENCOUNTER — Other Ambulatory Visit: Payer: Self-pay | Admitting: Dermatology

## 2011-12-18 ENCOUNTER — Encounter: Payer: Self-pay | Admitting: Cardiology

## 2011-12-31 ENCOUNTER — Ambulatory Visit (INDEPENDENT_AMBULATORY_CARE_PROVIDER_SITE_OTHER): Payer: Medicare Other | Admitting: Cardiology

## 2011-12-31 ENCOUNTER — Encounter: Payer: Self-pay | Admitting: Cardiology

## 2011-12-31 VITALS — BP 132/80 | HR 54 | Resp 18 | Wt 186.8 lb

## 2011-12-31 DIAGNOSIS — E785 Hyperlipidemia, unspecified: Secondary | ICD-10-CM

## 2011-12-31 DIAGNOSIS — I10 Essential (primary) hypertension: Secondary | ICD-10-CM

## 2011-12-31 DIAGNOSIS — I519 Heart disease, unspecified: Secondary | ICD-10-CM

## 2011-12-31 DIAGNOSIS — I2510 Atherosclerotic heart disease of native coronary artery without angina pectoris: Secondary | ICD-10-CM

## 2011-12-31 NOTE — Assessment & Plan Note (Signed)
He has no signs or symptoms of congestive heart failure. Continue management with ACE inhibitor and beta blocker therapy. No indication for diuretic therapy at this time.

## 2011-12-31 NOTE — Assessment & Plan Note (Signed)
He is status post redo bypass surgery in 2007. Cardiac catheterization in 2010 showed occlusion of the vein graft to the intermediate and occlusion of the vein graft to the PDA. The LIMA to the LAD was patent. He also has had successful stenting of the first diagonal branch. He remains asymptomatic at this point. We will continue his aggressive medical therapy including aspirin, amlodipine, isosorbide, and metoprolol.

## 2011-12-31 NOTE — Progress Notes (Signed)
HPI [name] [name] is seen today for followup. He states he is doing very well. He has had no significant chest pain or shortness of breath. He denies any palpitations. He remains active exercising in the pool 3 days a week and playing golf once a week. He was started back on metformin for diabetes treatment.  He has a history of coronary disease and is status post redo coronary bypass surgery in 2007. Cardiac catheterization in 2010 showed occlusion of the vein graft to the intermediate vessel and to the posterior descending artery. He has a patent LIMA graft to the LAD and moderate left ventricular dysfunction. No Known Allergies  Current Outpatient Prescriptions on File Prior to Visit  Medication Sig Dispense Refill  . allopurinol (ZYLOPRIM) 300 MG tablet Take 1 tablet (300 mg total) by mouth daily.  90 tablet  3  . amLODipine (NORVASC) 10 MG tablet Take 10 mg by mouth daily.        Marland Kitchen atorvastatin (LIPITOR) 40 MG tablet Take 40 mg by mouth daily.        . benazepril (LOTENSIN) 40 MG tablet Take 40 mg by mouth daily.        . clopidogrel (PLAVIX) 75 MG tablet Take 1 tablet (75 mg total) by mouth daily.  90 tablet  3  . co-enzyme Q-10 30 MG capsule Take 30 mg by mouth daily.        . ergocalciferol (VITAMIN D2) 50000 UNITS capsule Take 50,000 Units by mouth every 30 (thirty) days. Twice monthly      . folic acid (FOLVITE) 400 MCG tablet Take 400 mcg by mouth 2 (two) times daily.       . hydrALAZINE (APRESOLINE) 25 MG tablet Take 1 tablet (25 mg total) by mouth 3 (three) times daily.  90 tablet  5  . isosorbide mononitrate (IMDUR) 30 MG 24 hr tablet Take 1 tablet (30 mg total) by mouth 2 (two) times daily.  60 tablet  6  . metFORMIN (GLUCOPHAGE) 500 MG tablet Take 1 tablet (500 mg total) by mouth daily.  30 tablet    . methotrexate (RHEUMATREX) 2.5 MG tablet Take 2.5 mg by mouth once a week. Caution:Chemotherapy. Protect from light. 4 tablets once a week      . metoprolol tartrate (LOPRESSOR) 25 MG tablet  Take 12.5 mg by mouth 2 (two) times daily.        . nitroGLYCERIN (NITROSTAT) 0.4 MG SL tablet Place 1 tablet (0.4 mg total) under the tongue every 5 (five) minutes as needed.  25 tablet  11  . aspirin 81 MG tablet Take 81 mg by mouth daily.          Past Medical History  Diagnosis Date  . CAD (coronary artery disease)     REDO CABG 2007. SUBSEQUENT OCCLUSION OF ALL VEIN GRAFTS.   Marland Kitchen HTN (hypertension)   . Hypercholesteremia   . Chronic renal insufficiency   . Left ventricular dysfunction     MODERATE  . Gout   . History of TIAs 2000  . Prostate cancer   . Carotid arterial disease     Past Surgical History  Procedure Date  . Coronary artery bypass graft 9604,5409  . Carotid endarterectomy 1992, 2007    LEFT, RIGHT  . Back surgery   . Radioactive seed implant 2005  . Knee arthroscopy     RIGHT KNEE  . Cardiovascular stress test 04-02-08    EF 30%    Family History  Problem Relation Age  of Onset  . Other Father 47    CEREBRAL HEMORRHAGE  . Cancer Mother 70    History   Social History  . Marital Status: Married    Spouse Name: N/A    Number of Children: 3  . Years of Education: N/A   Occupational History  . Insurance company safety     retired   Social History Main Topics  . Smoking status: Former Smoker    Types: Cigarettes    Quit date: 09/27/1986  . Smokeless tobacco: Not on file  . Alcohol Use: No  . Drug Use: No  . Sexually Active:    Other Topics Concern  . Not on file   Social History Narrative  . No narrative on file    ROS  as noted in history of present illness.  All other systems were reviewed and are negative.  PHYSICAL EXAM BP 132/80  Pulse 54  Resp 18  Wt 186 lb 12.8 oz (84.732 kg) The patient is alert and oriented x 3.  The mood and affect are normal.  The skin is warm and dry.  Color is normal.  The HEENT exam reveals that the sclera are nonicteric.  The mucous membranes are moist.  The carotids are 2+ with a soft right carotid  bruit. He has bilateral endarterectomy scars.  There is no thyromegaly.  There is no JVD.  The lungs are clear.  The chest wall is non tender. There is an old median sternotomy scar.  The heart exam reveals a regular rate with a normal S1 and S2.  There are no murmurs, gallops, or rubs.  The PMI is not displaced.   Abdominal exam reveals good bowel sounds.  There is no guarding or rebound.  There is no hepatosplenomegaly or tenderness.  There are no masses.  Exam of the legs reveal no clubbing, cyanosis, or edema.  The legs are without rashes.  The distal pulses are intact.  Cranial nerves II - XII are intact.  Motor and sensory functions are intact.  The gait is normal.  Laboratory data:  ECG today demonstrates sinus bradycardia with first-degree AV block. Heart rate is 52 beats per minute. He has LVH with QRS widening and possible septal infarction.  ASSESSMENT AND PLAN

## 2011-12-31 NOTE — Patient Instructions (Signed)
Continue your current medication.  I will see you again in 6 months.   

## 2011-12-31 NOTE — Assessment & Plan Note (Signed)
His blood pressure control has improved significantly with increase in his hydralazine to 3 times daily. Continue with his current regimen.

## 2012-01-27 ENCOUNTER — Other Ambulatory Visit: Payer: Self-pay | Admitting: *Deleted

## 2012-01-27 DIAGNOSIS — I6529 Occlusion and stenosis of unspecified carotid artery: Secondary | ICD-10-CM

## 2012-01-28 ENCOUNTER — Encounter (INDEPENDENT_AMBULATORY_CARE_PROVIDER_SITE_OTHER): Payer: Medicare Other

## 2012-01-28 DIAGNOSIS — I6529 Occlusion and stenosis of unspecified carotid artery: Secondary | ICD-10-CM

## 2012-03-16 ENCOUNTER — Other Ambulatory Visit: Payer: Self-pay | Admitting: Cardiology

## 2012-03-16 MED ORDER — ISOSORBIDE MONONITRATE ER 30 MG PO TB24: 30.0000 mg | ORAL_TABLET | Freq: Two times a day (BID) | ORAL | Status: DC

## 2012-04-11 ENCOUNTER — Other Ambulatory Visit: Payer: Self-pay | Admitting: *Deleted

## 2012-04-11 MED ORDER — CLOPIDOGREL BISULFATE 75 MG PO TABS: 75.0000 mg | ORAL_TABLET | Freq: Every day | ORAL | Status: DC

## 2012-04-11 MED ORDER — HYDRALAZINE HCL 25 MG PO TABS: 25.0000 mg | ORAL_TABLET | Freq: Three times a day (TID) | ORAL | Status: DC

## 2012-05-02 ENCOUNTER — Other Ambulatory Visit: Payer: Self-pay | Admitting: Cardiology

## 2012-05-02 MED ORDER — ISOSORBIDE MONONITRATE ER 30 MG PO TB24: 30.0000 mg | ORAL_TABLET | Freq: Two times a day (BID) | ORAL | Status: DC

## 2012-06-02 ENCOUNTER — Other Ambulatory Visit: Payer: Self-pay | Admitting: Dermatology

## 2012-06-15 ENCOUNTER — Other Ambulatory Visit: Payer: Self-pay | Admitting: Dermatology

## 2012-08-25 ENCOUNTER — Encounter: Payer: Self-pay | Admitting: Cardiology

## 2012-08-25 ENCOUNTER — Ambulatory Visit (INDEPENDENT_AMBULATORY_CARE_PROVIDER_SITE_OTHER): Payer: Medicare Other | Admitting: Cardiology

## 2012-08-25 VITALS — BP 140/61 | HR 51 | Resp 18 | Ht 73.0 in | Wt 189.4 lb

## 2012-08-25 DIAGNOSIS — E7800 Pure hypercholesterolemia, unspecified: Secondary | ICD-10-CM

## 2012-08-25 DIAGNOSIS — I779 Disorder of arteries and arterioles, unspecified: Secondary | ICD-10-CM

## 2012-08-25 DIAGNOSIS — I10 Essential (primary) hypertension: Secondary | ICD-10-CM

## 2012-08-25 DIAGNOSIS — I519 Heart disease, unspecified: Secondary | ICD-10-CM

## 2012-08-25 DIAGNOSIS — I5022 Chronic systolic (congestive) heart failure: Secondary | ICD-10-CM

## 2012-08-25 DIAGNOSIS — I509 Heart failure, unspecified: Secondary | ICD-10-CM

## 2012-08-25 DIAGNOSIS — N189 Chronic kidney disease, unspecified: Secondary | ICD-10-CM

## 2012-08-25 DIAGNOSIS — I2510 Atherosclerotic heart disease of native coronary artery without angina pectoris: Secondary | ICD-10-CM

## 2012-08-25 NOTE — Patient Instructions (Signed)
Continue your current medication.  I will see you again in 6 months.   

## 2012-08-25 NOTE — Progress Notes (Signed)
HPI [name] [name] is seen today for followup. He has a history of coronary disease and is status post redo coronary bypass surgery in 2007. Cardiac catheterization in 2010 showed occlusion of the vein graft to the intermediate vessel and to the posterior descending artery. He has a patent LIMA graft to the LAD and moderate left ventricular dysfunction. On followup today he reports he is doing well. He denies any chest pain or shortness of breath. [facility] and walks in the water. He does complain of his hips hurting sometimes when he walks usually if he goes uphill. He did have lower extremity arterial Dopplers in 2007 which were normal. He has had no TIA or CVA symptoms. No Known Allergies  Current Outpatient Prescriptions on File Prior to Visit  Medication Sig Dispense Refill  . allopurinol (ZYLOPRIM) 300 MG tablet Take 1 tablet (300 mg total) by mouth daily.  90 tablet  3  . amLODipine (NORVASC) 10 MG tablet Take 10 mg by mouth daily.        Marland Kitchen aspirin 81 MG tablet Take 81 mg by mouth daily.        Marland Kitchen atorvastatin (LIPITOR) 40 MG tablet Take 40 mg by mouth daily.        . benazepril (LOTENSIN) 40 MG tablet Take 40 mg by mouth daily.        . clopidogrel (PLAVIX) 75 MG tablet Take 1 tablet (75 mg total) by mouth daily.  90 tablet  3  . ergocalciferol (VITAMIN D2) 50000 UNITS capsule Take 50,000 Units by mouth every 30 (thirty) days. Twice monthly      . folic acid (FOLVITE) 400 MCG tablet Take 400 mcg by mouth 2 (two) times daily.       . hydrALAZINE (APRESOLINE) 25 MG tablet Take 1 tablet (25 mg total) by mouth 3 (three) times daily.  270 tablet  3  . isosorbide mononitrate (IMDUR) 30 MG 24 hr tablet Take 1 tablet (30 mg total) by mouth 2 (two) times daily.  180 tablet  1  . metFORMIN (GLUCOPHAGE) 500 MG tablet Take 1 tablet (500 mg total) by mouth daily.  30 tablet    . methotrexate (RHEUMATREX) 2.5 MG tablet Take 2.5 mg by mouth once a week. Caution:Chemotherapy. Protect from  light. 4 tablets once a week      . metoprolol tartrate (LOPRESSOR) 25 MG tablet Take 12.5 mg by mouth 2 (two) times daily.        . nitroGLYCERIN (NITROSTAT) 0.4 MG SL tablet Place 1 tablet (0.4 mg total) under the tongue every 5 (five) minutes as needed.  25 tablet  11    Past Medical History  Diagnosis Date  . CAD (coronary artery disease)     REDO CABG 2007. SUBSEQUENT OCCLUSION OF ALL VEIN GRAFTS.   Marland Kitchen HTN (hypertension)   . Hypercholesteremia   . Chronic renal insufficiency   . Left ventricular dysfunction     MODERATE  . Gout   . History of TIAs 2000  . Prostate cancer   . Carotid arterial disease     Past Surgical History  Procedure Date  . Coronary artery bypass graft 2956,2130  . Carotid endarterectomy 1992, 2007    LEFT, RIGHT  . Back surgery   . Radioactive seed implant 2005  . Knee arthroscopy     RIGHT KNEE  . Cardiovascular stress test 04-02-08    EF 30%    Family History  Problem Relation  Age of Onset  . Other Father 64    CEREBRAL HEMORRHAGE  . Cancer Mother 76    History   Social History  . Marital Status: Married    Spouse Name: N/A    Number of Children: 3  . Years of Education: N/A   Occupational History  . Insurance company safety     retired   Social History Main Topics  . Smoking status: Former Smoker    Types: Cigarettes    Quit date: 09/27/1986  . Smokeless tobacco: Not on file  . Alcohol Use: No  . Drug Use: No  . Sexually Active:    Other Topics Concern  . Not on file   Social History Narrative  . No narrative on file    ROS  as noted in history of present illness.  All other systems were reviewed and are negative.  PHYSICAL EXAM BP 140/61  Pulse 51  Resp 18  Ht 6\' 1"  (1.854 m)  Wt 189 lb 6.4 oz (85.911 kg)  BMI 24.99 kg/m2  SpO2 98% The patient is alert and oriented x 3.  The mood and affect are normal.  The skin is warm and dry.  Color is normal.  The HEENT exam reveals that the sclera are nonicteric.  The  mucous membranes are moist.  The carotids are 2+ with a right carotid bruit. He has bilateral endarterectomy scars.  There is no thyromegaly.  There is no JVD.  The lungs are clear.  The chest wall is non tender. There is an old median sternotomy scar.  The heart exam reveals a regular rate with a normal S1 and S2.  There are no murmurs, gallops, or rubs.  The PMI is not displaced.   Abdominal exam reveals good bowel sounds.  There is no guarding or rebound.  There is no hepatosplenomegaly or tenderness.  There are no masses.  Exam of the legs reveal no clubbing, cyanosis, or edema.  The legs are without rashes.  The pedal pulses are good.  Cranial nerves II - XII are intact.  Motor and sensory functions are intact.  The gait is normal.  Laboratory data:  Patient's labs were reviewed from March 2013. He is scheduled for repeat lab work on December 18.  ASSESSMENT AND PLAN  1. Coronary disease status post redo CABG in 2007. LIMA graft to the LAD is patent. Other grafts are occluded. He has no significant anginal medical therapy. We'll continue with Plavix, isosorbide, and metoprolol. He is also on a baby aspirin daily.  2. Hypertension, well controlled on multiple medications including metoprolol, hydralazine, Lotensin, and amlodipine.  3. Hypercholesterolemia continue atorvastatin.  4. Chronic congestive heart failure with ischemic cardiomyopathy. Ejection fraction 35-40%. He is asymptomatic. Continue beta blocker and ACE inhibitor.  5. Carotid arterial disease status post carotid endarterectomy. Carotid Dopplers in May of this year showed stable disease with no severe stenosis.

## 2012-09-13 ENCOUNTER — Encounter: Payer: Self-pay | Admitting: Cardiology

## 2012-11-06 ENCOUNTER — Other Ambulatory Visit: Payer: Self-pay

## 2012-11-06 MED ORDER — ALLOPURINOL 300 MG PO TABS: 300.0000 mg | ORAL_TABLET | Freq: Every day | ORAL | Status: DC

## 2012-12-02 ENCOUNTER — Other Ambulatory Visit: Payer: Self-pay | Admitting: Cardiology

## 2012-12-22 ENCOUNTER — Other Ambulatory Visit: Payer: Self-pay | Admitting: Dermatology

## 2013-01-03 ENCOUNTER — Other Ambulatory Visit: Payer: Self-pay

## 2013-01-08 ENCOUNTER — Telehealth: Payer: Self-pay | Admitting: Cardiology

## 2013-01-08 MED ORDER — ALLOPURINOL 300 MG PO TABS: 300.0000 mg | ORAL_TABLET | Freq: Every day | ORAL | Status: DC

## 2013-01-08 NOTE — Telephone Encounter (Signed)
New Prob     Pt is having some issues refilling medication (ALLOPURINOL). Authorization is needed, request has already been sent. Pt just want to follow up and would like to speak to nurse.

## 2013-01-08 NOTE — Telephone Encounter (Signed)
Returned call to patient spoke to wife she stated patient needed refill on allopurinol.Advised will send in refill,next time need to send refill to PCP.[facility] called spoke to pharmacist he stated already received refill,advised to contact PCP next time for refills on allopurinol.

## 2013-01-18 ENCOUNTER — Encounter: Payer: Self-pay | Admitting: Cardiology

## 2013-02-07 ENCOUNTER — Ambulatory Visit: Payer: Medicare Other | Admitting: Cardiology

## 2013-02-14 ENCOUNTER — Encounter (INDEPENDENT_AMBULATORY_CARE_PROVIDER_SITE_OTHER): Payer: Medicare Other

## 2013-02-14 DIAGNOSIS — I6529 Occlusion and stenosis of unspecified carotid artery: Secondary | ICD-10-CM

## 2013-02-22 ENCOUNTER — Telehealth: Payer: Self-pay | Admitting: Cardiology

## 2013-02-22 ENCOUNTER — Other Ambulatory Visit: Payer: Self-pay

## 2013-02-22 NOTE — Telephone Encounter (Signed)
Patient called carotid doppler results given.

## 2013-02-22 NOTE — Telephone Encounter (Signed)
New Problem:    Patient called in returning your call. Please call back. 

## 2013-02-22 NOTE — Telephone Encounter (Signed)
Follow-up: ° ° ° °Patient called in returning your call.  Please call back. °

## 2013-03-28 ENCOUNTER — Encounter: Payer: Self-pay | Admitting: Cardiology

## 2013-03-28 ENCOUNTER — Ambulatory Visit (INDEPENDENT_AMBULATORY_CARE_PROVIDER_SITE_OTHER): Payer: Medicare Other | Admitting: Cardiology

## 2013-03-28 VITALS — BP 154/62 | HR 61 | Ht 73.0 in | Wt 188.8 lb

## 2013-03-28 DIAGNOSIS — I10 Essential (primary) hypertension: Secondary | ICD-10-CM

## 2013-03-28 DIAGNOSIS — I2510 Atherosclerotic heart disease of native coronary artery without angina pectoris: Secondary | ICD-10-CM

## 2013-03-28 DIAGNOSIS — E7800 Pure hypercholesterolemia, unspecified: Secondary | ICD-10-CM

## 2013-03-28 DIAGNOSIS — I519 Heart disease, unspecified: Secondary | ICD-10-CM

## 2013-03-28 NOTE — Progress Notes (Signed)
HPI [name] [name] is seen today for followup. He has a history of coronary disease and is status post redo coronary bypass surgery in 2007. Cardiac catheterization in 2010 showed occlusion of the vein graft to the intermediate vessel and to the posterior descending artery. He has a patent LIMA graft to the LAD and moderate left ventricular dysfunction. He has prior stenting of the first diagonal branch. On followup today he reports he is doing well. He denies any chest pain or shortness of breath. His major complaint is that he tires more easily. He continues to play golf regularly. He has variable amounts of arthralgias in his methotrexate dose was increased on his last visit. Recent carotid Doppler studies were stable.  No Known Allergies  Current Outpatient Prescriptions on File Prior to Visit  Medication Sig Dispense Refill  . allopurinol (ZYLOPRIM) 300 MG tablet Take 1 tablet (300 mg total) by mouth daily.  30 tablet  3  . amLODipine (NORVASC) 10 MG tablet Take 10 mg by mouth daily.        Marland Kitchen aspirin 81 MG tablet Take 81 mg by mouth daily.        Marland Kitchen atorvastatin (LIPITOR) 40 MG tablet Take 40 mg by mouth daily.        . benazepril (LOTENSIN) 40 MG tablet Take 40 mg by mouth daily.        . clopidogrel (PLAVIX) 75 MG tablet Take 1 tablet (75 mg total) by mouth daily.  90 tablet  3  . ergocalciferol (VITAMIN D2) 50000 UNITS capsule Take 50,000 Units by mouth every 30 (thirty) days. Twice monthly      . folic acid (FOLVITE) 400 MCG tablet Take 400 mcg by mouth 2 (two) times daily.       . hydrALAZINE (APRESOLINE) 25 MG tablet Take 1 tablet (25 mg total) by mouth 3 (three) times daily.  270 tablet  3  . isosorbide mononitrate (IMDUR) 30 MG 24 hr tablet TAKE 1 TABLET BY MOUTH TWICE A DAY  180 tablet  1  . methotrexate (RHEUMATREX) 2.5 MG tablet Take 2.5 mg by mouth once a week. Caution:Chemotherapy. Protect from light. 4 tablets once a week      . metoprolol tartrate (LOPRESSOR) 25 MG tablet Take 12.5 mg by  mouth 2 (two) times daily.        . nitroGLYCERIN (NITROSTAT) 0.4 MG SL tablet Place 1 tablet (0.4 mg total) under the tongue every 5 (five) minutes as needed.  25 tablet  11   No current facility-administered medications on file prior to visit.    Past Medical History  Diagnosis Date  . CAD (coronary artery disease)     REDO CABG 2007. SUBSEQUENT OCCLUSION OF ALL VEIN GRAFTS.   Marland Kitchen HTN (hypertension)   . Hypercholesteremia   . Chronic renal insufficiency   . Left ventricular dysfunction     MODERATE  . Gout   . History of TIAs 2000  . Prostate cancer   . Carotid arterial disease   . Chronic systolic CHF (congestive heart failure) [id number]-[id number]    Past Surgical History  Procedure Laterality Date  . Coronary artery bypass graft  5784,6962  . Carotid endarterectomy  1992, 2007    LEFT, RIGHT  . Back surgery    . Radioactive seed implant  2005  . Knee arthroscopy      RIGHT KNEE  . Cardiovascular stress test  04-02-08    EF 30%    Family History  Problem Relation Age of  Onset  . Other Father 56    CEREBRAL HEMORRHAGE  . Cancer Mother 27    History   Social History  . Marital Status: Married    Spouse Name: N/A    Number of Children: 3  . Years of Education: N/A   Occupational History  . Insurance company safety     retired   Social History Main Topics  . Smoking status: Former Smoker    Types: Cigarettes    Quit date: 09/27/1986  . Smokeless tobacco: Not on file  . Alcohol Use: No  . Drug Use: No  . Sexually Active:    Other Topics Concern  . Not on file   Social History Narrative  . No narrative on file    ROS  as noted in history of present illness.  All other systems were reviewed and are negative.  PHYSICAL EXAM BP 154/62  Pulse 61  Ht 6\' 1"  (1.854 m)  Wt 188 lb 12.8 oz (85.639 kg)  BMI 24.91 kg/m2  SpO2 97% The patient is alert and oriented x 3.  The skin is warm and dry.  Color is normal.  The HEENT exam reveals that the sclera are  nonicteric.  The mucous membranes are moist.  The carotids are 2+ with a right carotid bruit. He has bilateral endarterectomy scars.  There is no thyromegaly.  There is no JVD.  The lungs are clear.   There is an old median sternotomy scar.  The heart exam reveals a regular rate with a normal S1 and S2.  There are no murmurs, gallops, or rubs.  The PMI is not displaced.   Abdominal exam reveals good bowel sounds.  There is no guarding or rebound.  There is no hepatosplenomegaly or tenderness.  There are no masses.  Exam of the legs reveal no clubbing, cyanosis, or edema.  The legs are without rashes.  The pedal pulses are good.  Cranial nerves II - XII are intact.  Motor and sensory functions are intact.  The gait is normal.  Laboratory data:  ECG today demonstrates sinus bradycardia with occasional PVCs. Rate is 58 beats per minute. There is mild interventricular conduction delay and nonspecific T-wave abnormality. Blood work from April 2014 was reviewed. Creatinine was 1.36. Total cholesterol 143, triglycerides 174, HDL 34, LDL 74. CBC was normal.  ASSESSMENT AND PLAN  1. Coronary disease status post redo CABG in 2007. LIMA graft to the LAD is patent. Other grafts are occluded. Prior stent of the first diagonal. He has no significant anginal medical therapy. We'll continue with Plavix, isosorbide, amlodipine, or and metoprolol. He is also on a baby aspirin daily.  2. Hypertension, well controlled on multiple medications including metoprolol, hydralazine, Lotensin, and amlodipine.  3. Hypercholesterolemia continue atorvastatin.  4. Chronic congestive heart failure with ischemic cardiomyopathy. Ejection fraction 35-40%. He is asymptomatic. Continue beta blocker and ACE inhibitor.  5. Carotid arterial disease status post carotid endarterectomy. Carotid Dopplers recently showed stable disease with no severe stenosis. We'll repeat Doppler studies in one year.

## 2013-03-28 NOTE — Patient Instructions (Signed)
Continue your current therapy  I will see you in 6 months.   

## 2013-04-15 ENCOUNTER — Other Ambulatory Visit: Payer: Self-pay | Admitting: Cardiology

## 2013-04-16 NOTE — Telephone Encounter (Signed)
clopidogrel (PLAVIX) 75 MG tablet  Take 1 tablet (75 mg total) by mouth daily.   90 tablet   3   ASSESSMENT AND PLAN 1. Coronary disease status post redo CABG in 2007. LIMA graft to the LAD is patent. Other grafts are occluded. Prior stent of the first diagonal. He has no significant anginal medical therapy. We'll continue with Plavix, isosorbide, amlodipine, or and metoprolol. He is also on a baby aspirin daily.  Peter M Swaziland, MD at 03/28/2013 2:48 PM

## 2013-05-02 ENCOUNTER — Other Ambulatory Visit: Payer: Self-pay | Admitting: Cardiology

## 2013-06-01 ENCOUNTER — Other Ambulatory Visit: Payer: Self-pay | Admitting: Cardiology

## 2013-06-20 ENCOUNTER — Inpatient Hospital Stay (HOSPITAL_COMMUNITY)
Admission: EM | Admit: 2013-06-20 | Discharge: 2013-06-25 | DRG: 418 | Disposition: A | Discharge status: Home / Self Care | Payer: Medicare Other | Attending: Internal Medicine | Admitting: Internal Medicine

## 2013-06-20 ENCOUNTER — Encounter (HOSPITAL_COMMUNITY): Payer: Self-pay

## 2013-06-20 ENCOUNTER — Emergency Department (HOSPITAL_COMMUNITY): Payer: Medicare Other

## 2013-06-20 DIAGNOSIS — Z8546 Personal history of malignant neoplasm of prostate: Secondary | ICD-10-CM

## 2013-06-20 DIAGNOSIS — R1011 Right upper quadrant pain: Secondary | ICD-10-CM | POA: Diagnosis present

## 2013-06-20 DIAGNOSIS — K810 Acute cholecystitis: Secondary | ICD-10-CM

## 2013-06-20 DIAGNOSIS — Z923 Personal history of irradiation: Secondary | ICD-10-CM

## 2013-06-20 DIAGNOSIS — I2510 Atherosclerotic heart disease of native coronary artery without angina pectoris: Secondary | ICD-10-CM | POA: Diagnosis present

## 2013-06-20 DIAGNOSIS — M109 Gout, unspecified: Secondary | ICD-10-CM | POA: Diagnosis present

## 2013-06-20 DIAGNOSIS — R630 Anorexia: Secondary | ICD-10-CM | POA: Diagnosis present

## 2013-06-20 DIAGNOSIS — I5022 Chronic systolic (congestive) heart failure: Secondary | ICD-10-CM | POA: Diagnosis present

## 2013-06-20 DIAGNOSIS — Z951 Presence of aortocoronary bypass graft: Secondary | ICD-10-CM

## 2013-06-20 DIAGNOSIS — I10 Essential (primary) hypertension: Secondary | ICD-10-CM | POA: Diagnosis present

## 2013-06-20 DIAGNOSIS — K819 Cholecystitis, unspecified: Secondary | ICD-10-CM

## 2013-06-20 DIAGNOSIS — E876 Hypokalemia: Secondary | ICD-10-CM | POA: Diagnosis not present

## 2013-06-20 DIAGNOSIS — E785 Hyperlipidemia, unspecified: Secondary | ICD-10-CM | POA: Diagnosis present

## 2013-06-20 DIAGNOSIS — R112 Nausea with vomiting, unspecified: Secondary | ICD-10-CM | POA: Diagnosis present

## 2013-06-20 DIAGNOSIS — I252 Old myocardial infarction: Secondary | ICD-10-CM

## 2013-06-20 DIAGNOSIS — I779 Disorder of arteries and arterioles, unspecified: Secondary | ICD-10-CM | POA: Diagnosis present

## 2013-06-20 DIAGNOSIS — I6529 Occlusion and stenosis of unspecified carotid artery: Secondary | ICD-10-CM | POA: Diagnosis present

## 2013-06-20 DIAGNOSIS — I509 Heart failure, unspecified: Secondary | ICD-10-CM | POA: Diagnosis present

## 2013-06-20 DIAGNOSIS — I129 Hypertensive chronic kidney disease with stage 1 through stage 4 chronic kidney disease, or unspecified chronic kidney disease: Secondary | ICD-10-CM | POA: Diagnosis present

## 2013-06-20 DIAGNOSIS — N189 Chronic kidney disease, unspecified: Secondary | ICD-10-CM | POA: Diagnosis present

## 2013-06-20 DIAGNOSIS — I519 Heart disease, unspecified: Secondary | ICD-10-CM | POA: Diagnosis present

## 2013-06-20 DIAGNOSIS — K8000 Calculus of gallbladder with acute cholecystitis without obstruction: Principal | ICD-10-CM | POA: Diagnosis present

## 2013-06-20 DIAGNOSIS — K5900 Constipation, unspecified: Secondary | ICD-10-CM

## 2013-06-20 DIAGNOSIS — C61 Malignant neoplasm of prostate: Secondary | ICD-10-CM | POA: Diagnosis present

## 2013-06-20 DIAGNOSIS — N183 Chronic kidney disease, stage 3 unspecified: Secondary | ICD-10-CM | POA: Diagnosis present

## 2013-06-20 DIAGNOSIS — I25709 Atherosclerosis of coronary artery bypass graft(s), unspecified, with unspecified angina pectoris: Secondary | ICD-10-CM | POA: Diagnosis present

## 2013-06-20 DIAGNOSIS — R17 Unspecified jaundice: Secondary | ICD-10-CM

## 2013-06-20 DIAGNOSIS — Z8673 Personal history of transient ischemic attack (TIA), and cerebral infarction without residual deficits: Secondary | ICD-10-CM

## 2013-06-20 DIAGNOSIS — Z87891 Personal history of nicotine dependence: Secondary | ICD-10-CM

## 2013-06-20 DIAGNOSIS — N179 Acute kidney failure, unspecified: Secondary | ICD-10-CM | POA: Diagnosis not present

## 2013-06-20 DIAGNOSIS — E7800 Pure hypercholesterolemia, unspecified: Secondary | ICD-10-CM | POA: Diagnosis present

## 2013-06-20 DIAGNOSIS — I2589 Other forms of chronic ischemic heart disease: Secondary | ICD-10-CM | POA: Diagnosis present

## 2013-06-20 DIAGNOSIS — Z9861 Coronary angioplasty status: Secondary | ICD-10-CM

## 2013-06-20 DIAGNOSIS — R109 Unspecified abdominal pain: Secondary | ICD-10-CM

## 2013-06-20 HISTORY — DX: Ischemic cardiomyopathy: I25.5

## 2013-06-20 HISTORY — DX: Chronic kidney disease, stage 3 unspecified: N18.30

## 2013-06-20 HISTORY — DX: Chronic kidney disease, stage 3 (moderate): N18.3

## 2013-06-20 LAB — COMPREHENSIVE METABOLIC PANEL WITH GFR
ALT: 15 U/L (ref 0–53)
AST: 19 U/L (ref 0–37)
Albumin: 3.7 g/dL (ref 3.5–5.2)
Alkaline Phosphatase: 108 U/L (ref 39–117)
BUN: 19 mg/dL (ref 6–23)
CO2: 24 meq/L (ref 19–32)
Calcium: 9.2 mg/dL (ref 8.4–10.5)
Chloride: 100 meq/L (ref 96–112)
Creatinine, Ser: 1.06 mg/dL (ref 0.50–1.35)
GFR calc Af Amer: 76 mL/min — ABNORMAL LOW
GFR calc non Af Amer: 65 mL/min — ABNORMAL LOW
Glucose, Bld: 145 mg/dL — ABNORMAL HIGH (ref 70–99)
Potassium: 3.7 meq/L (ref 3.5–5.1)
Sodium: 138 meq/L (ref 135–145)
Total Bilirubin: 2.2 mg/dL — ABNORMAL HIGH (ref 0.3–1.2)
Total Protein: 6.9 g/dL (ref 6.0–8.3)

## 2013-06-20 LAB — URINALYSIS, ROUTINE W REFLEX MICROSCOPIC
Bilirubin Urine: NEGATIVE
Glucose, UA: NEGATIVE mg/dL
Hgb urine dipstick: NEGATIVE
Ketones, ur: NEGATIVE mg/dL
Protein, ur: 100 mg/dL — AB
Urobilinogen, UA: 0.2 mg/dL (ref 0.0–1.0)

## 2013-06-20 LAB — CBC WITH DIFFERENTIAL/PLATELET
Basophils Absolute: 0 10*3/uL (ref 0.0–0.1)
Basophils Relative: 0 % (ref 0–1)
Eosinophils Absolute: 0.1 10*3/uL (ref 0.0–0.7)
Eosinophils Relative: 1 % (ref 0–5)
Hemoglobin: 13.2 g/dL (ref 13.0–17.0)
Lymphocytes Relative: 11 % — ABNORMAL LOW (ref 12–46)
Lymphs Abs: 0.9 10*3/uL (ref 0.7–4.0)
MCH: 30.8 pg (ref 26.0–34.0)
MCHC: 35.2 g/dL (ref 30.0–36.0)
Monocytes Absolute: 0.5 10*3/uL (ref 0.1–1.0)
Neutro Abs: 7.3 10*3/uL (ref 1.7–7.7)
Neutrophils Relative %: 83 % — ABNORMAL HIGH (ref 43–77)
Platelets: 243 10*3/uL (ref 150–400)
RDW: 14.9 % (ref 11.5–15.5)

## 2013-06-20 LAB — POCT I-STAT TROPONIN I: Troponin i, poc: 0.02 ng/mL (ref 0.00–0.08)

## 2013-06-20 LAB — LIPASE, BLOOD: Lipase: 59 U/L (ref 11–59)

## 2013-06-20 LAB — URINE MICROSCOPIC-ADD ON

## 2013-06-20 LAB — PROTIME-INR: INR: 1.1 (ref 0.00–1.49)

## 2013-06-20 MED ORDER — KCL IN DEXTROSE-NACL 10-5-0.45 MEQ/L-%-% IV SOLN
INTRAVENOUS | Status: AC
  Administered 2013-06-20: 13:00:00 via INTRAVENOUS
  Filled 2013-06-20 (×2): qty 1000

## 2013-06-20 MED ORDER — METHOTREXATE 2.5 MG PO TABS
10.0000 mg | ORAL_TABLET | ORAL | Status: DC
  Administered 2013-06-25: 11:00:00 10 mg via ORAL
  Filled 2013-06-20: qty 4

## 2013-06-20 MED ORDER — MORPHINE SULFATE 4 MG/ML IJ SOLN
4.0000 mg | Freq: Once | INTRAMUSCULAR | Status: AC
  Administered 2013-06-20: 4 mg via INTRAVENOUS
  Filled 2013-06-20: qty 1

## 2013-06-20 MED ORDER — HYDRALAZINE HCL 25 MG PO TABS
25.0000 mg | ORAL_TABLET | Freq: Three times a day (TID) | ORAL | Status: DC
  Administered 2013-06-20 – 2013-06-25 (×16): 25 mg via ORAL
  Filled 2013-06-20 (×19): qty 1

## 2013-06-20 MED ORDER — ALLOPURINOL 300 MG PO TABS
300.0000 mg | ORAL_TABLET | Freq: Every day | ORAL | Status: DC
  Administered 2013-06-20 – 2013-06-25 (×6): 300 mg via ORAL
  Filled 2013-06-20 (×6): qty 1

## 2013-06-20 MED ORDER — IOHEXOL 300 MG/ML  SOLN
100.0000 mL | Freq: Once | INTRAMUSCULAR | Status: AC | PRN
  Administered 2013-06-20: 100 mL via INTRAVENOUS

## 2013-06-20 MED ORDER — ONDANSETRON HCL 4 MG/2ML IJ SOLN: 4.0000 mg | Freq: Four times a day (QID) | INTRAMUSCULAR | Status: DC | PRN

## 2013-06-20 MED ORDER — HYDROCODONE-ACETAMINOPHEN 5-325 MG PO TABS
1.0000 | ORAL_TABLET | ORAL | Status: DC | PRN
  Administered 2013-06-22: 1 via ORAL
  Administered 2013-06-22 – 2013-06-25 (×7): 2 via ORAL
  Filled 2013-06-20 (×5): qty 2
  Filled 2013-06-20: qty 1
  Filled 2013-06-20 (×2): qty 2

## 2013-06-20 MED ORDER — ALBUTEROL SULFATE (5 MG/ML) 0.5% IN NEBU: 2.5000 mg | INHALATION_SOLUTION | RESPIRATORY_TRACT | Status: DC | PRN

## 2013-06-20 MED ORDER — FOLIC ACID 400 MCG PO TABS: 400.0000 ug | ORAL_TABLET | Freq: Two times a day (BID) | ORAL | Status: DC

## 2013-06-20 MED ORDER — ASPIRIN EC 81 MG PO TBEC
81.0000 mg | DELAYED_RELEASE_TABLET | Freq: Every day | ORAL | Status: DC
  Administered 2013-06-20 – 2013-06-25 (×6): 81 mg via ORAL
  Filled 2013-06-20 (×6): qty 1

## 2013-06-20 MED ORDER — VITAMIN D (ERGOCALCIFEROL) 1.25 MG (50000 UNIT) PO CAPS: 50000.0000 [IU] | ORAL_CAPSULE | ORAL | Status: DC

## 2013-06-20 MED ORDER — ACETAMINOPHEN 325 MG PO TABS
650.0000 mg | ORAL_TABLET | Freq: Four times a day (QID) | ORAL | Status: DC | PRN
  Administered 2013-06-20 – 2013-06-22 (×2): 650 mg via ORAL
  Filled 2013-06-20 (×2): qty 2

## 2013-06-20 MED ORDER — ONDANSETRON HCL 4 MG PO TABS: 4.0000 mg | ORAL_TABLET | Freq: Four times a day (QID) | ORAL | Status: DC | PRN

## 2013-06-20 MED ORDER — ONDANSETRON HCL 4 MG/2ML IJ SOLN
4.0000 mg | Freq: Once | INTRAMUSCULAR | Status: AC
  Administered 2013-06-20: 4 mg via INTRAVENOUS
  Filled 2013-06-20: qty 2

## 2013-06-20 MED ORDER — SODIUM CHLORIDE 0.9 % IV SOLN
3.0000 g | Freq: Four times a day (QID) | INTRAVENOUS | Status: DC
  Administered 2013-06-20 (×2): 3 g via INTRAVENOUS
  Filled 2013-06-20 (×3): qty 3

## 2013-06-20 MED ORDER — ERGOCALCIFEROL 1.25 MG (50000 UT) PO CAPS: 50000.0000 [IU] | ORAL_CAPSULE | ORAL | Status: DC

## 2013-06-20 MED ORDER — ASPIRIN 81 MG PO TABS: 81.0000 mg | ORAL_TABLET | Freq: Every day | ORAL | Status: DC

## 2013-06-20 MED ORDER — MORPHINE SULFATE 2 MG/ML IJ SOLN: 2.0000 mg | INTRAMUSCULAR | Status: DC | PRN

## 2013-06-20 MED ORDER — PIPERACILLIN-TAZOBACTAM 3.375 G IVPB
3.3750 g | Freq: Three times a day (TID) | INTRAVENOUS | Status: DC
  Administered 2013-06-20 – 2013-06-22 (×5): 3.375 g via INTRAVENOUS
  Filled 2013-06-20 (×7): qty 50

## 2013-06-20 MED ORDER — SODIUM CHLORIDE 0.9 % IV SOLN: 3.0000 g | Freq: Three times a day (TID) | INTRAVENOUS | Status: DC

## 2013-06-20 MED ORDER — METOPROLOL TARTRATE 12.5 MG HALF TABLET
12.5000 mg | ORAL_TABLET | Freq: Two times a day (BID) | ORAL | Status: DC
  Administered 2013-06-20 – 2013-06-25 (×11): 12.5 mg via ORAL
  Filled 2013-06-20 (×12): qty 1

## 2013-06-20 MED ORDER — SODIUM CHLORIDE 0.9 % IJ SOLN
3.0000 mL | Freq: Two times a day (BID) | INTRAMUSCULAR | Status: DC
  Administered 2013-06-20 – 2013-06-24 (×6): 3 mL via INTRAVENOUS

## 2013-06-20 MED ORDER — GUAIFENESIN-DM 100-10 MG/5ML PO SYRP: 5.0000 mL | ORAL_SOLUTION | ORAL | Status: DC | PRN

## 2013-06-20 MED ORDER — ISOSORBIDE MONONITRATE ER 30 MG PO TB24
30.0000 mg | ORAL_TABLET | Freq: Two times a day (BID) | ORAL | Status: DC
  Administered 2013-06-20 – 2013-06-25 (×11): 30 mg via ORAL
  Filled 2013-06-20 (×12): qty 1

## 2013-06-20 MED ORDER — ATORVASTATIN CALCIUM 80 MG PO TABS
80.0000 mg | ORAL_TABLET | Freq: Every day | ORAL | Status: DC
  Administered 2013-06-20 – 2013-06-24 (×5): 80 mg via ORAL
  Filled 2013-06-20 (×6): qty 1

## 2013-06-20 MED ORDER — BENAZEPRIL HCL 40 MG PO TABS
40.0000 mg | ORAL_TABLET | Freq: Every day | ORAL | Status: DC
  Administered 2013-06-20 – 2013-06-25 (×6): 40 mg via ORAL
  Filled 2013-06-20 (×6): qty 1

## 2013-06-20 MED ORDER — FOLIC ACID 1 MG PO TABS
1.0000 mg | ORAL_TABLET | Freq: Every day | ORAL | Status: DC
  Administered 2013-06-20 – 2013-06-25 (×6): 1 mg via ORAL
  Filled 2013-06-20 (×6): qty 1

## 2013-06-20 MED ORDER — IOHEXOL 300 MG/ML  SOLN
25.0000 mL | INTRAMUSCULAR | Status: AC
  Administered 2013-06-20: 25 mL via ORAL

## 2013-06-20 MED ORDER — AMLODIPINE BESYLATE 10 MG PO TABS
10.0000 mg | ORAL_TABLET | Freq: Every day | ORAL | Status: DC
  Administered 2013-06-20 – 2013-06-25 (×6): 10 mg via ORAL
  Filled 2013-06-20 (×6): qty 1

## 2013-06-20 MED ORDER — NITROGLYCERIN 0.4 MG SL SUBL: 0.4000 mg | SUBLINGUAL_TABLET | SUBLINGUAL | Status: DC | PRN

## 2013-06-20 NOTE — Consult Note (Signed)
I saw the patient, participated in the history, exam and medical decision making, and concur with the residents note above.  Several day history of initial intermittent epigastric and right upper quadrant pain now constant.  Alert, no apparent distress, resting comfortably Clear to auscultation Regular rate and rhythm; positive murmur Soft,not really distended. Right upper quadrant and right mid abdominal tenderness to palpation. No guarding, no rebound No jaundice, no scleral icterus   Acute cholecystitis Elevated bilirubin Coronary artery disease History of congestive heart failure Hypertension Carotid arterial disease On Plavix  The patient has had his echocardiogram and we are waiting final recommendations from cardiology. If he appears to have cardiac clearance then we will proceed with cholecystectomy at the earliest on Friday in order to allow some of the Plavix to leave his system. We will check repeat LFTs and CBC in the morning to make sure his bilirubin is normalizing. We'll also check his blood level for his Plavix level. I explained to the patient and his wife that it is possible that we may not proceed with surgery for Friday  I believe the patient's symptoms are consistent with gallbladder disease.  We discussed gallbladder disease.   I discussed laparoscopic cholecystectomy with IOC in detail.  The patient was shown diagrams detailing the procedure.  We discussed the risks and benefits of a laparoscopic cholecystectomy including, but not limited to bleeding, infection, injury to surrounding structures such as the intestine or liver, bile leak, retained gallstones, need to convert to an open procedure, prolonged diarrhea, blood clots such as  DVT, common bile duct injury, anesthesia risks, and possible need for additional procedures.  We discussed the typical post-operative recovery course. I explained that the likelihood of improvement of their symptoms is good.  I  explained that he was at higher risk for bleeding, conversion to open procedure, and injury to surrounding structures due to the acute inflammation of his gallbladder  He has elected to proceed with surgery once we feel he is ready for OR   Mary Sella. [name], MD, FACS General, Bariatric, & Minimally Invasive Surgery [facility], [place]

## 2013-06-20 NOTE — H&P (Signed)
[facility]                                                                                    Patient Demographics  [name] [name], is a 79 y.o. male  MRN: [id number]   DOB - [birth year]  Admit Date - 06/20/2013  Outpatient Primary MD for the patient is Juline Patch, MD   With History of -  Past Medical History  Diagnosis Date  . CAD (coronary artery disease)     a. s/p  MI in 1988 and CABG 1989;  b. s/p redo CABG 2007;  c. s/p PCI DES to VG->PDA and native Diag;  d. 2010 Cath: occluded VG's to RI and PDA, patent LIMA->LAD, patent stent in diag->med Rx.  . HTN (hypertension)   . Hypercholesteremia   . CKD (chronic kidney disease), stage III   . Ischemic cardiomyopathy     a. 03/2008 EF 30% noted on Myoview.  . Gout   . History of TIAs 2000  . Prostate cancer   . Carotid arterial disease     a. s/p bilat patch angioplasties;  b. 01/2013 RICA 0-39%, LICA 40-59%  . Chronic systolic CHF (congestive heart failure)     a. 03/2008 EF 30% noted on Myoview.      Past Surgical History  Procedure Laterality Date  . Coronary artery bypass graft  8756,4332  . Carotid endarterectomy  1992, 2007    LEFT, RIGHT  . Back surgery    . Radioactive seed implant  2005  . Knee arthroscopy      RIGHT KNEE  . Cardiovascular stress test  04-02-08    EF 30%    in for   Chief Complaint  Patient presents with  . Abdominal Pain     HPI  [name] [name]  is a 79 y.o. male, history of CAD status post CABG x2 in the past, coronary artery stent placed more than 7 years ago, follows with Dr. Peter [place], history of prostate cancer status post radiation treatment, chronic systolic heart failure last EF around 40% currently compensated, chronic kidney disease stage III, hypertension, dyslipidemia, comes to the hospital with three-day history of sharp constant right upper quadrant abdominal pain which is nonradiating, worse with taking a deep breath and needs better with rest. No associated  symptoms, no nausea vomiting or diarrhea, no fever chills. Denies any chest pain palpitations cough phlegm shortness of breath. No focal weakness. Came to the ER where his workup was consistent with acute cholecystitis, he was seen by general surgery and cardiology and hospitalist was requested to admit the patient.    Review of Systems    In addition to the HPI above,   No Fever-chills, No Headache, No changes with Vision or hearing, No problems swallowing food or Liquids, No Chest pain, Cough or Shortness of Breath, +ve Abdominal pain, No Nausea or Vommitting, Bowel movements are regular, No Blood in stool or Urine, No dysuria, No new skin rashes or bruises, No new joints pains-aches,  No new weakness, tingling, numbness in any extremity, No recent weight gain or loss, No polyuria, polydypsia or polyphagia, No significant Mental Stressors.  A full  10 point Review of Systems was done, except as stated above, all other Review of Systems were negative.   Social History History  Substance Use Topics  . Smoking status: Former Smoker    Types: Cigarettes    Quit date: 09/27/1986  . Smokeless tobacco: Not on file  . Alcohol Use: No      Family History Family History  Problem Relation Age of Onset  . Other Father 63    CEREBRAL HEMORRHAGE  . Cancer Mother 31      Prior to Admission medications   Medication Sig Start Date End Date Taking? Authorizing Provider  allopurinol (ZYLOPRIM) 300 MG tablet Take 300 mg by mouth daily.   Yes Historical Provider, MD  amLODipine (NORVASC) 10 MG tablet Take 10 mg by mouth daily.     Yes Historical Provider, MD  aspirin 81 MG tablet Take 81 mg by mouth daily.     Yes Historical Provider, MD  atorvastatin (LIPITOR) 40 MG tablet Take 80 mg by mouth daily.    Yes Historical Provider, MD  benazepril (LOTENSIN) 40 MG tablet Take 40 mg by mouth daily.     Yes Historical Provider, MD  clopidogrel (PLAVIX) 75 MG tablet Take 75 mg by mouth daily.    Yes Historical Provider, MD  ergocalciferol (VITAMIN D2) 50000 UNITS capsule Take 50,000 Units by mouth every 30 (thirty) days. Twice monthly 1st and 15th   Yes Historical Provider, MD  folic acid (FOLVITE) 400 MCG tablet Take 400 mcg by mouth 2 (two) times daily.    Yes Historical Provider, MD  hydrALAZINE (APRESOLINE) 25 MG tablet Take 1 tablet (25 mg total) by mouth 3 (three) times daily. 04/11/12  Yes Peter M [place], MD  hydrochlorothiazide (HYDRODIURIL) 25 MG tablet Take 25 mg by mouth daily.   Yes Historical Provider, MD  isosorbide mononitrate (IMDUR) 30 MG 24 hr tablet Take 30 mg by mouth 2 (two) times daily.   Yes Historical Provider, MD  methotrexate (RHEUMATREX) 2.5 MG tablet Take 2.5 mg by mouth once a week. Caution:Chemotherapy. Protect from light. 4 tablets once a week   Yes Historical Provider, MD  metoprolol tartrate (LOPRESSOR) 25 MG tablet Take 12.5 mg by mouth 2 (two) times daily.     Yes Historical Provider, MD  OVER THE COUNTER MEDICATION Take 1 tablet by mouth 2 (two) times daily. lysene   Yes Historical Provider, MD  nitroGLYCERIN (NITROSTAT) 0.4 MG SL tablet Place 1 tablet (0.4 mg total) under the tongue every 5 (five) minutes as needed. 09/20/11   Peter M [place], MD    No Known Allergies  Physical Exam  Vitals  Blood pressure 158/63, pulse 81, temperature 98 F (36.7 C), temperature source Oral, resp. rate 18, height 6\' 1"  (1.854 m), weight 81.647 kg (180 lb), SpO2 94.00%.   1. General elderly white male lying in bed in NAD,     2. Normal affect and insight, Not Suicidal or Homicidal, Awake Alert, Oriented X 3.  3. No F.N deficits, ALL C.Nerves Intact, Strength 5/5 all 4 extremities, Sensation intact all 4 extremities, Plantars down going.  4. Ears and Eyes appear Normal, Conjunctivae clear, PERRLA. Moist Oral Mucosa.  5. Supple Neck, No JVD, No cervical lymphadenopathy appriciated, No Carotid Bruits.  6. Symmetrical Chest wall movement, Good air movement  bilaterally, CTAB.  7. RRR, No Gallops, Rubs or Murmurs, No Parasternal Heave.  8. Positive Bowel Sounds, Abdomen Soft RUQ is tender with +ve murphy's size, No organomegaly appriciated,No rebound -guarding  or rigidity.  9.  No Cyanosis, Normal Skin Turgor, No Skin Rash or Bruise.  10. Good muscle tone,  joints appear normal , no effusions, Normal ROM.  11. No Palpable Lymph Nodes in Neck or Axillae     Data Review  CBC  Recent Labs Lab 06/20/13 0250  WBC 8.8  HGB 13.2  HCT 37.5*  PLT 243  MCV 87.6  MCH 30.8  MCHC 35.2  RDW 14.9  LYMPHSABS 0.9  MONOABS 0.5  EOSABS 0.1  BASOSABS 0.0   ------------------------------------------------------------------------------------------------------------------  Chemistries   Recent Labs Lab 06/20/13 0250  NA 138  K 3.7  CL 100  CO2 24  GLUCOSE 145*  BUN 19  CREATININE 1.06  CALCIUM 9.2  AST 19  ALT 15  ALKPHOS 108  BILITOT 2.2*   ------------------------------------------------------------------------------------------------------------------ estimated creatinine clearance is 64.9 ml/min (by C-G formula based on Cr of 1.06). ------------------------------------------------------------------------------------------------------------------ No results found for this basename: TSH, T4TOTAL, FREET3, T3FREE, THYROIDAB,  in the last 72 hours   Coagulation profile No results found for this basename: INR, PROTIME,  in the last 168 hours ------------------------------------------------------------------------------------------------------------------- No results found for this basename: DDIMER,  in the last 72 hours -------------------------------------------------------------------------------------------------------------------  Cardiac Enzymes No results found for this basename: CK, CKMB, TROPONINI, MYOGLOBIN,  in the last 168  hours ------------------------------------------------------------------------------------------------------------------ No components found with this basename: POCBNP,    ---------------------------------------------------------------------------------------------------------------  Urinalysis    Component Value Date/Time   COLORURINE YELLOW 06/20/2013 0659   APPEARANCEUR CLEAR 06/20/2013 0659   LABSPEC 1.015 06/20/2013 0659   PHURINE 6.0 06/20/2013 0659   GLUCOSEU NEGATIVE 06/20/2013 0659   HGBUR NEGATIVE 06/20/2013 0659   BILIRUBINUR NEGATIVE 06/20/2013 0659   KETONESUR NEGATIVE 06/20/2013 0659   PROTEINUR 100* 06/20/2013 0659   UROBILINOGEN 0.2 06/20/2013 0659   NITRITE NEGATIVE 06/20/2013 0659   LEUKOCYTESUR NEGATIVE 06/20/2013 0659    ----------------------------------------------------------------------------------------------------------------  Imaging results:   Ct Abdomen Pelvis W Contrast  06/20/2013   CLINICAL DATA:  Abdominal pain.  EXAM: CT ABDOMEN AND PELVIS WITH CONTRAST  TECHNIQUE: Multidetector CT imaging of the abdomen and pelvis was performed using the standard protocol following bolus administration of intravenous contrast.  CONTRAST:  OMNIPAQUE IOHEXOL 300 MG/ML  SOLN  COMPARISON:  02/23/2009.  FINDINGS: BODY WALL: Unremarkable.  LOWER CHEST:  Mediastinum: Coronary artery atherosclerosis and aortic annular calcification. Mild cardiomegaly.  Lungs/pleura: Calcified right hilar lymph nodes.  ABDOMEN/PELVIS:  Liver: Coarse calcification noted. Circumscribed sub cm low-attenuation areas around the gallbladder fossa were likely present previously, compatible with benign entity such is cysts.  Biliary: Cholelithiasis. There is gallbladder distention and gallbladder wall thickening with probable mild pericholecystic fat haziness. No biliary ductal enlargement.  Pancreas: Unremarkable.  Spleen: Numerous coarse calcifications consistent with previous granulomatous infection.   Adrenals: Unchanged 18 mm nodule in the medial limb right adrenal gland, consistent with adenoma.  Kidneys and ureters: Numerous low dense renal masses, size similar to priors when accounting for long interval. A 60 Hounsfield unit, 12 mm diameter nodule from the low interpolar left kidney is unchanged in size and density, most likely hemorrhagic cyst. There is a notably large cyst from the lower pole right kidney, 11 cm in diameter. No hydronephrosis.  Bladder: Unremarkable.  Bowel: No obstruction.   Extensive colonic diverticulosis.  Retroperitoneum: No mass or adenopathy.  Peritoneum: No free fluid or gas.  Reproductive: Prostate brachytherapy seeds.  Vascular: No acute abnormality.  OSSEOUS: No acute abnormalities. No suspicious lytic or blastic lesions.  IMPRESSION: 1. In the appropriate  clinical setting, findings consistent with acute cholecystitis (gallbladder distention, cholelithiasis, and gallbladder wall thickening). 2. Bilateral renal cystic disease, appearance stable from 2010. 3. Colonic diverticulosis. 4. 18 mm right adrenal adenoma.   Electronically Signed   By: Tiburcio Pea   On: 06/20/2013 05:25    My personal review of EKG: Rhythm NSR,   no Acute ST changes    Assessment & Plan    1. Acute cholecystitis. - Was addressed by general surgery who were following the patient, agree with IV Unasyn for now, he is on clear liquid diet, he was on Plavix which was stopped, he is at best moderate risk for adverse cardiopulmonary during the perioperative period. He has been seen by cardiology. He is okay to proceed for surgery from our standpoint whenever general surgeon is comfortable.   2. CAD, chronic systolic CHF. Completely compensated from his heart standpoint at this time, is chest pain free, his Plavix has been stopped, low-dose aspirin being continued for now, continue secondary risk modification via beta blocker, Imdur, statin along with baby aspirin, will monitor fluid intake,  weight I&O. Appreciate cardiology followup and input.   3. Hypertension and dyslipidemia. No acute issues home medications will be continued unchanged. They include Norvasc, Imdur, beta blocker, hydralazine and statin.    4. Hx  prostate cancer status post radiation treatment. No acute issues outpatient followup with [facility] once discharged.    DVT Prophylaxis   SCDs    AM Labs Ordered, also please review Full Orders  Family Communication: Admission, patients condition and plan of care including tests being ordered have been discussed with the patient and wife who indicate understanding and agree with the plan and Code Status.  Code Status Full  Likely DC to  Home  Condition Fair  Time spent in minutes :35    Sundy Houchins K M.D on 06/20/2013 at 10:19 AM  Between 7am to 7pm - Pager - [phone]  After 7pm go to [email or web address] - password [email or web address]  And look for the night coverage person covering me after hours  [facility] Office  [phone]

## 2013-06-20 NOTE — Progress Notes (Signed)
Utilization Review Completed Ashe Gago J. Jayci Ellefson, RN, BSN, NCM [phone]  

## 2013-06-20 NOTE — ED Notes (Signed)
Floor RN aware of admitting antibiotic order.

## 2013-06-20 NOTE — Consult Note (Signed)
Patient ID: [name] [name] MRN: [id number], DOB/AGE: 77-25-1933   Admit date: 06/20/2013  Primary Physician: Juline Patch, MD Primary Cardiologist: P. [place], MD  Pt. Profile:  79 y/o male with h/o CAD s/p CABG and redo CABG who presented to the ED today with abdominal pain and we've been asked to eval for pre-op clearance.  Problem List  Past Medical History  Diagnosis Date  . CAD (coronary artery disease)     a. s/p  MI in 1988 and CABG 1989;  b. s/p redo CABG 2007;  c. s/p PCI DES to VG->PDA and native Diag;  d. 2010 Cath: occluded VG's to RI and PDA, patent LIMA->LAD, patent stent in diag->med Rx.  . HTN (hypertension)   . Hypercholesteremia   . CKD (chronic kidney disease), stage III   . Ischemic cardiomyopathy     a. 03/2008 EF 30% noted on Myoview.  . Gout   . History of TIAs 2000  . Prostate cancer   . Carotid arterial disease     a. s/p bilat patch angioplasties;  b. 01/2013 RICA 0-39%, LICA 40-59%  . Chronic systolic CHF (congestive heart failure)     a. 03/2008 EF 30% noted on Myoview.    Past Surgical History  Procedure Laterality Date  . Coronary artery bypass graft  [id number],[id number]  . Carotid endarterectomy  1992, 2007    LEFT, RIGHT  . Back surgery    . Radioactive seed implant  2005  . Knee arthroscopy      RIGHT KNEE  . Cardiovascular stress test  04-02-08    EF 30%     Allergies  No Known Allergies  HPI  79 year old male with prior history of coronary artery disease status post coronary artery bypass grafting in 1989 followed by redo coronary artery bypass grafting in 2007.  In 2008, he underwent stenting of the vein graft to the PDA as well as the native diagonal. Catheterization in 2010 subsequently showed occlusion of his vein grafts with a patent LIMA to the LAD along with a patent stent in the diagonal. He has been doing well, exercising 3 days a week by walking in a pool for 30 or 45 minutes. 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 He generally rides the course. In his activities, he is without chest pain or dyspnea and has not had significant limitations with his activity. He does say that over the past few years, he has to pace himself while doing yard work and will take regular breaks but this hasn't changed recently.  He was in his usual state of health until approximately this past weekend when he began to experience intermittent epigastric and upper abdominal discomfort and bloating.    The symptoms worsened yesterday and became associated with nausea. He tried to make himself vomit but was unsuccessful. He presented to the ED this morning because of worsening abdominal discomfort with nausea and CT of his abdomen suggests cholecystitis. He's not having any chest pain or dyspnea. We have been asked to evaluate for preoperative cardiac risk assessment. He denies PND, orthopnea, weight gain (he says he is down 4 pounds over the past few weeks), dizziness, syncope, edema, or early satiety.  Home Medications  Prior to Admission medications   Medication Sig Start Date End Date Taking? Authorizing Provider  allopurinol (ZYLOPRIM) 300 MG tablet Take 300 mg by mouth daily.   Yes Historical Provider, MD  amLODipine (NORVASC)  10 MG tablet Take 10 mg by mouth daily.     Yes Historical Provider, MD  aspirin 81 MG tablet Take 81 mg by mouth daily.     Yes Historical Provider, MD  atorvastatin (LIPITOR) 40 MG tablet Take 80 mg by mouth daily.    Yes Historical Provider, MD  benazepril (LOTENSIN) 40 MG tablet Take 40 mg by mouth daily.     Yes Historical Provider, MD  clopidogrel (PLAVIX) 75 MG tablet Take 75 mg by mouth daily.   Yes Historical Provider, MD  ergocalciferol (VITAMIN D2) 50000 UNITS capsule Take 50,000 Units by mouth every 30 (thirty) days. Twice monthly 1st and 15th   Yes Historical Provider, MD  folic acid (FOLVITE) 400 MCG tablet Take 400 mcg by mouth 2 (two) times daily.    Yes Historical Provider,  MD  hydrALAZINE (APRESOLINE) 25 MG tablet Take 1 tablet (25 mg total) by mouth 3 (three) times daily. 04/11/12  Yes Peter M [place], MD  hydrochlorothiazide (HYDRODIURIL) 25 MG tablet Take 25 mg by mouth daily.   Yes Historical Provider, MD  isosorbide mononitrate (IMDUR) 30 MG 24 hr tablet Take 30 mg by mouth 2 (two) times daily.   Yes Historical Provider, MD  methotrexate (RHEUMATREX) 2.5 MG tablet Take 2.5 mg by mouth once a week. Caution:Chemotherapy. Protect from light. 4 tablets once a week   Yes Historical Provider, MD  metoprolol tartrate (LOPRESSOR) 25 MG tablet Take 12.5 mg by mouth 2 (two) times daily.     Yes Historical Provider, MD  OVER THE COUNTER MEDICATION Take 1 tablet by mouth 2 (two) times daily. lysene   Yes Historical Provider, MD  nitroGLYCERIN (NITROSTAT) 0.4 MG SL tablet Place 1 tablet (0.4 mg total) under the tongue every 5 (five) minutes as needed. 09/20/11   Peter M [place], MD   Family History  Family History  Problem Relation Age of Onset  . Other Father 10    CEREBRAL HEMORRHAGE  . Cancer Mother 15   Social History  History   Social History  . Marital Status: Married    Spouse Name: N/A    Number of Children: 3  . Years of Education: N/A   Occupational History  . [employer] safety     retired   Social History Main Topics  . Smoking status: Former Smoker    Types: Cigarettes    Quit date: 09/27/1986  . Smokeless tobacco: Not on file  . Alcohol Use: No  . Drug Use: No  . Sexual Activity:    Other Topics Concern  . Not on file   Social History Narrative   Lives in [place] with wife.  Works at a [employer] two days/wk and does water walking 3 days/wk x 30-45 mins per session.    Review of Systems General:  No chills, fever, night sweats or weight changes.  Cardiovascular:  No chest pain, dyspnea on exertion, edema, orthopnea, palpitations, paroxysmal nocturnal dyspnea. Dermatological: No rash, lesions/masses Respiratory: No  cough, dyspnea Urologic: No hematuria, dysuria Abdominal:   Positive for abdominal and epigastric discomfort as well as nausea and bloating. No vomiting, diarrhea, bright red blood per rectum, melena, or hematemesis Neurologic:  No visual changes, wkns, changes in mental status. All other systems reviewed and are otherwise negative except as noted above.  Physical Exam  Blood pressure 158/63, pulse 81, temperature 98 F (36.7 C), temperature source Oral, resp. rate 18, height 6\' 1"  (1.854 m), weight 180 lb (81.647 kg), SpO2  94.00%.  General: Pleasant, NAD Psych: Normal affect. Neuro: Alert and oriented X 3. Moves all extremities spontaneously. HEENT: Normal  Neck: Supple without  JVD. He has bilateral carotid bruits.  Lungs:  Resp regular and unlabored, with bibasilar crackles.  Heart: RRR no s3, s4, or murmurs. Abdomen:  distended and diffusely tender, BS + x 4.  Extremities: No clubbing, cyanosis or edema.  distal pulses are 1+ on the right and 2+ on the left.   Labs  Troponin I, POC 0.02  Lab Results  Component Value Date   WBC 8.8 06/20/2013   HGB 13.2 06/20/2013   HCT 37.5* 06/20/2013   MCV 87.6 06/20/2013   PLT 243 06/20/2013     Recent Labs Lab 06/20/13 0250  NA 138  K 3.7  CL 100  CO2 24  BUN 19  CREATININE 1.06  CALCIUM 9.2  PROT 6.9  BILITOT 2.2*  ALKPHOS 108  ALT 15  AST 19  GLUCOSE 145*   Lab Results  Component Value Date   CHOL 137 11/12/2010   HDL 33* 11/12/2010   LDLCALC 74 11/12/2010   TRIG 151 11/12/2010   Radiology/Studies  Ct Abdomen Pelvis W Contrast  06/20/2013   CLINICAL DATA:  Abdominal pain.  EXAM: CT ABDOMEN AND PELVIS WITH CONTRAST   IMPRESSION: 1. In the appropriate clinical setting, findings consistent with acute cholecystitis (gallbladder distention, cholelithiasis, and gallbladder wall thickening). 2. Bilateral renal cystic disease, appearance stable from 2010. 3. Colonic diverticulosis. 4. 18 mm right adrenal adenoma.   Electronically  Signed   By: Tiburcio Pea   On: 06/20/2013 05:25   ECG  Rsr, 79, somewhat diffuse flat to upsloping ST depression - slightly more pronounced than on previous ecg, pvc  ASSESSMENT AND PLAN  1. Acute cholecystitis: Patient's been seen by general surgery as well as internal medicine. He is being considered for cholecystectomy. We've been asked to evaluate for preoperative cardiac risk assessment. Patient is very active at home and exercises regularly. He does not experience chest pain or dyspnea. From a heart failure standpoint, his volume and weight have been stable and he denies PND, orthopnea, dizziness, syncope, edema, or early satiety. We recommend continuation of beta blocker, statin, nitrate, and ACE inhibitor therapy throughout the perioperative period. We will reassess 2-D echocardiogram as LV function has not been assessed since 2009. We will have to watch volume status closely in the setting of known LV dysfunction. He will not require further ischemic evaluation prior to going to the OR. As he has not had a recent MI or stent placement, it should be safe to discontinue Plavix for the time being.  2. Coronary artery disease: See #1. Continue beta blocker, nitrate, and statin throughout perioperative period with resumption of aspirin and plavix when felt to be feasible by surgery.  If he cannot take PO's, rec IV lopressor 2.5mg  q 6h.  3.  Chronic systolic CHF/ICM:  Last documented EF 30% by Myoview in 2009.  Rec f/u echo this admission.  Euvolemic on exam today.  He does not generally have issues with volume @ home.  Cont bb/acei.  Watch volume status closely.  4.  CKD II-III:  Currently stable. Follow.  5.  Carotid Arterial Dzs:  Cont statin.  Signed, Nicolasa Ducking, NP 06/20/2013, 9:52 AM  Patient seen with NP, agree with the above note.    He is stable from a cardiac standpoint with good exercise tolerance.  No chest pain, no exertional dyspnea.  Agree with repeat  echo to  reassess EF. Continue home meds.  If NPO, would use IV metoprolol as above to replace po metoprolol.  Timing of cholecystectomy per surgery: he can come off Plavix as has had no recent PCI.  Can check P2Y12 to see degree of platelet inhibition.  Marca Ancona 06/20/2013 10:28 AM

## 2013-06-20 NOTE — Progress Notes (Signed)
ANTIBIOTIC CONSULT NOTE - INITIAL  Pharmacy Consult for Zosyn Indication: cholycystitis  No Known Allergies  Patient Measurements: Height: 6\' 1"  (185.4 cm) Weight: 180 lb (81.647 kg) IBW/kg (Calculated) : 79.9   Vital Signs: Temp: 102.9 F (39.4 C) (09/24 1840) Temp src: Oral (09/24 1840) BP: 116/49 mmHg (09/24 1840) Pulse Rate: 80 (09/24 1840) Intake/Output from previous day:   Intake/Output from this shift:    Labs:  Recent Labs  06/20/13 0250  WBC 8.8  HGB 13.2  PLT 243  CREATININE 1.06   Estimated Creatinine Clearance: 64.9 ml/min (by C-G formula based on Cr of 1.06). No results found for this basename: VANCOTROUGH, VANCOPEAK, VANCORANDOM, GENTTROUGH, GENTPEAK, GENTRANDOM, TOBRATROUGH, TOBRAPEAK, TOBRARND, AMIKACINPEAK, AMIKACINTROU, AMIKACIN,  in the last 72 hours   Microbiology: No results found for this or any previous visit (from the past 720 hour(s)).  Medical History: Past Medical History  Diagnosis Date  . CAD (coronary artery disease)     a. s/p  MI in 1988 and CABG 1989;  b. s/p redo CABG 2007;  c. s/p PCI DES to VG->PDA and native Diag;  d. 2010 Cath: occluded VG's to RI and PDA, patent LIMA->LAD, patent stent in diag->med Rx.  . HTN (hypertension)   . Hypercholesteremia   . CKD (chronic kidney disease), stage III   . Ischemic cardiomyopathy     a. 03/2008 EF 30% noted on Myoview.  . Gout   . History of TIAs 2000  . Prostate cancer   . Carotid arterial disease     a. s/p bilat patch angioplasties;  b. 01/2013 RICA 0-39%, LICA 40-59%  . Chronic systolic CHF (congestive heart failure)     a. 03/2008 EF 30% noted on Myoview.    Medications:  Prescriptions prior to admission  Medication Sig Dispense Refill  . allopurinol (ZYLOPRIM) 300 MG tablet Take 300 mg by mouth daily.      Marland Kitchen amLODipine (NORVASC) 10 MG tablet Take 10 mg by mouth daily.        Marland Kitchen aspirin 81 MG tablet Take 81 mg by mouth daily.        Marland Kitchen atorvastatin (LIPITOR) 40 MG tablet Take  80 mg by mouth daily.       . benazepril (LOTENSIN) 40 MG tablet Take 40 mg by mouth daily.        . clopidogrel (PLAVIX) 75 MG tablet Take 75 mg by mouth daily.      . ergocalciferol (VITAMIN D2) 50000 UNITS capsule Take 50,000 Units by mouth every 30 (thirty) days. Twice monthly 1st and 15th      . folic acid (FOLVITE) 400 MCG tablet Take 400 mcg by mouth 2 (two) times daily.       . hydrALAZINE (APRESOLINE) 25 MG tablet Take 1 tablet (25 mg total) by mouth 3 (three) times daily.  270 tablet  3  . hydrochlorothiazide (HYDRODIURIL) 25 MG tablet Take 25 mg by mouth daily.      . isosorbide mononitrate (IMDUR) 30 MG 24 hr tablet Take 30 mg by mouth 2 (two) times daily.      . methotrexate (RHEUMATREX) 2.5 MG tablet Take 2.5 mg by mouth once a week. Caution:Chemotherapy. Protect from light. 4 tablets once a week      . metoprolol tartrate (LOPRESSOR) 25 MG tablet Take 12.5 mg by mouth 2 (two) times daily.        Marland Kitchen OVER THE COUNTER MEDICATION Take 1 tablet by mouth 2 (two) times daily. lysene      .  nitroGLYCERIN (NITROSTAT) 0.4 MG SL tablet Place 1 tablet (0.4 mg total) under the tongue every 5 (five) minutes as needed.  25 tablet  11   Scheduled:  . allopurinol  300 mg Oral Daily  . amLODipine  10 mg Oral Daily  . aspirin EC  81 mg Oral Daily  . atorvastatin  80 mg Oral q1800  . benazepril  40 mg Oral Daily  . folic acid  1 mg Oral Daily  . hydrALAZINE  25 mg Oral TID  . isosorbide mononitrate  30 mg Oral BID  . [START ON 06/25/2013] methotrexate  10 mg Oral Q Mon  . metoprolol tartrate  12.5 mg Oral BID  . sodium chloride  3 mL Intravenous Q12H  . [START ON 06/27/2013] Vitamin D (Ergocalciferol)  50,000 Units Oral Q14 Days   Assessment: 79 y.o male with cholycystitis and Tc 102.9 F on Unasyn IV since this AM.  Blood cultures x 2 ordered.  WBC 8.8K this AM.  CBC and BMET ordered in AM.  SCr = 1.06 today. Estimated CrCl ~ 65 ml/min.  Pharmacy consulted to dose Zosyn IV.   Goal of Therapy:   Appropriate dose for renal function to treat infection.  Plan:  Zosyn 3.375 g IV q8hr, infuse each dose over 4 hours.  Follow up on culture results, clinical status and renal function.     Noah Delaine, RPh Clinical Pharmacist Pager: [phone]  06/20/2013,7:05 PM

## 2013-06-20 NOTE — ED Notes (Signed)
General surgery at bedside to consult

## 2013-06-20 NOTE — ED Provider Notes (Signed)
CSN: [id number]     Arrival date & time 06/20/13  0235 History   First MD Initiated Contact with Patient 06/20/13 0259     Chief Complaint  Patient presents with  . Abdominal Pain   (Consider location/radiation/quality/duration/timing/severity/associated sxs/prior Treatment) Patient is a 79 y.o. male presenting with abdominal pain. The history is provided by the patient.  Abdominal Pain He has been having epigastric and right upper quadrant pain for the last 2-1/2 days. He was awakened on the morning of September 21 with severe pain in the epigastrium and right upper quadrant. There is associated nausea but he was unable to vomit. He was sweaty but he denies chest pain or dyspnea. He states he felt like if you would burp he would be better. Pain persisted through most of the day and then started to subside and he actually felt better yesterday and then pain started to get worse again yesterday evening and continued to be severe all through the day today. Pain is rated at 6/10. It is worse when he is laying flat, better when he is standing. Eating does not affect it. Denies constipation or diarrhea. Has never had pain like this before. He has also been having some aching in his back which he feels is a different type of pain he relates to the fact that he has been laying around and not doing much since this pain started. Separately, he lost his appetite about 2 weeks ago and has been eating very little. His wife estimates a 4 or 5 pounds weight loss in this time.  Past Medical History  Diagnosis Date  . CAD (coronary artery disease)     REDO CABG 2007. SUBSEQUENT OCCLUSION OF ALL VEIN GRAFTS.   Marland Kitchen HTN (hypertension)   . Hypercholesteremia   . Chronic renal insufficiency   . Left ventricular dysfunction     MODERATE  . Gout   . History of TIAs 2000  . Prostate cancer   . Carotid arterial disease   . Chronic systolic CHF (congestive heart failure) 106/06/202013   Past Surgical History  Procedure  Laterality Date  . Coronary artery bypass graft  1308,6578  . Carotid endarterectomy  1992, 2007    LEFT, RIGHT  . Back surgery    . Radioactive seed implant  2005  . Knee arthroscopy      RIGHT KNEE  . Cardiovascular stress test  04-02-08    EF 30%   Family History  Problem Relation Age of Onset  . Other Father 32    CEREBRAL HEMORRHAGE  . Cancer Mother 27   History  Substance Use Topics  . Smoking status: Former Smoker    Types: Cigarettes    Quit date: 09/27/1986  . Smokeless tobacco: Not on file  . Alcohol Use: No    Review of Systems  Gastrointestinal: Positive for abdominal pain.  All other systems reviewed and are negative.    Allergies  Review of patient's allergies indicates no known allergies.  Home Medications   Current Outpatient Rx  Name  Route  Sig  Dispense  Refill  . allopurinol (ZYLOPRIM) 300 MG tablet   Oral   Take 300 mg by mouth daily.         Marland Kitchen amLODipine (NORVASC) 10 MG tablet   Oral   Take 10 mg by mouth daily.           Marland Kitchen aspirin 81 MG tablet   Oral   Take 81 mg by mouth daily.           Marland Kitchen  atorvastatin (LIPITOR) 40 MG tablet   Oral   Take 80 mg by mouth daily.          . benazepril (LOTENSIN) 40 MG tablet   Oral   Take 40 mg by mouth daily.           . clopidogrel (PLAVIX) 75 MG tablet   Oral   Take 75 mg by mouth daily.         . ergocalciferol (VITAMIN D2) 50000 UNITS capsule   Oral   Take 50,000 Units by mouth every 30 (thirty) days. Twice monthly 1st and 15th         . folic acid (FOLVITE) 400 MCG tablet   Oral   Take 400 mcg by mouth 2 (two) times daily.          . hydrALAZINE (APRESOLINE) 25 MG tablet   Oral   Take 1 tablet (25 mg total) by mouth 3 (three) times daily.   270 tablet   3   . hydrochlorothiazide (HYDRODIURIL) 25 MG tablet   Oral   Take 25 mg by mouth daily.         . isosorbide mononitrate (IMDUR) 30 MG 24 hr tablet   Oral   Take 30 mg by mouth 2 (two) times daily.          . methotrexate (RHEUMATREX) 2.5 MG tablet   Oral   Take 2.5 mg by mouth once a week. Caution:Chemotherapy. Protect from light. 4 tablets once a week         . metoprolol tartrate (LOPRESSOR) 25 MG tablet   Oral   Take 12.5 mg by mouth 2 (two) times daily.           Marland Kitchen OVER THE COUNTER MEDICATION   Oral   Take 1 tablet by mouth 2 (two) times daily. lysene         . nitroGLYCERIN (NITROSTAT) 0.4 MG SL tablet   Sublingual   Place 1 tablet (0.4 mg total) under the tongue every 5 (five) minutes as needed.   25 tablet   11    BP 132/100  Pulse 77  Temp(Src) 98 F (36.7 C) (Oral)  Resp 18  Ht 6\' 1"  (1.854 m)  Wt 180 lb (81.647 kg)  BMI 23.75 kg/m2  SpO2 98% Physical Exam  Nursing note and vitals reviewed.  79 year old male, resting comfortably and in no acute distress. Vital signs are significant for hypertension with blood pressure 132/100. Oxygen saturation is 98%, which is normal. Head is normocephalic and atraumatic. PERRLA, EOMI. Oropharynx is clear. Mild scleral icterus is present. Neck is nontender and supple without adenopathy or JVD. Back is nontender and there is no CVA tenderness. Lungs are clear without rales, wheezes, or rhonchi. Chest is nontender. Heart has regular rate and rhythm with 2/6 systolic ejection murmur heard along left sternal border. Abdomen is soft, flat, with moderate epigastric and right upper quadrant tenderness with a positive Murphy sign. There are no masses or hepatosplenomegaly and peristalsis is normoactive. Extremities have no cyanosis or edema, full range of motion is present. Skin is warm and dry without rash. Neurologic: Mental status is normal, cranial nerves are intact, there are no motor or sensory deficits.  ED Course  Procedures (including critical care time) Labs Review Results for orders placed during the hospital encounter of 06/20/13  CBC WITH DIFFERENTIAL      Result Value Range   WBC 8.8  4.0 - 10.5 K/uL   RBC  4.28   4.22 - 5.81 MIL/uL   Hemoglobin 13.2  13.0 - 17.0 g/dL   HCT 16.1 (*) 09.6 - 04.5 %   MCV 87.6  78.0 - 100.0 fL   MCH 30.8  26.0 - 34.0 pg   MCHC 35.2  30.0 - 36.0 g/dL   RDW 40.9  81.1 - 91.4 %   Platelets 243  150 - 400 K/uL   Neutrophils Relative % 83 (*) 43 - 77 %   Neutro Abs 7.3  1.7 - 7.7 K/uL   Lymphocytes Relative 11 (*) 12 - 46 %   Lymphs Abs 0.9  0.7 - 4.0 K/uL   Monocytes Relative 6  3 - 12 %   Monocytes Absolute 0.5  0.1 - 1.0 K/uL   Eosinophils Relative 1  0 - 5 %   Eosinophils Absolute 0.1  0.0 - 0.7 K/uL   Basophils Relative 0  0 - 1 %   Basophils Absolute 0.0  0.0 - 0.1 K/uL  COMPREHENSIVE METABOLIC PANEL      Result Value Range   Sodium 138  135 - 145 mEq/L   Potassium 3.7  3.5 - 5.1 mEq/L   Chloride 100  96 - 112 mEq/L   CO2 24  19 - 32 mEq/L   Glucose, Bld 145 (*) 70 - 99 mg/dL   BUN 19  6 - 23 mg/dL   Creatinine, Ser 7.82  0.50 - 1.35 mg/dL   Calcium 9.2  8.4 - 95.6 mg/dL   Total Protein 6.9  6.0 - 8.3 g/dL   Albumin 3.7  3.5 - 5.2 g/dL   AST 19  0 - 37 U/L   ALT 15  0 - 53 U/L   Alkaline Phosphatase 108  39 - 117 U/L   Total Bilirubin 2.2 (*) 0.3 - 1.2 mg/dL   GFR calc non Af Amer 65 (*) >90 mL/min   GFR calc Af Amer 76 (*) >90 mL/min  LIPASE, BLOOD      Result Value Range   Lipase 59  11 - 59 U/L  POCT I-STAT TROPONIN I      Result Value Range   Troponin i, poc 0.02  0.00 - 0.08 ng/mL   Comment 3            Imaging Review Ct Abdomen Pelvis W Contrast  06/20/2013   CLINICAL DATA:  Abdominal pain.  EXAM: CT ABDOMEN AND PELVIS WITH CONTRAST  TECHNIQUE: Multidetector CT imaging of the abdomen and pelvis was performed using the standard protocol following bolus administration of intravenous contrast.  CONTRAST:  OMNIPAQUE IOHEXOL 300 MG/ML  SOLN  COMPARISON:  02/23/2009.  FINDINGS: BODY WALL: Unremarkable.  LOWER CHEST:  Mediastinum: Coronary artery atherosclerosis and aortic annular calcification. Mild cardiomegaly.  Lungs/pleura: Calcified right  hilar lymph nodes.  ABDOMEN/PELVIS:  Liver: Coarse calcification noted. Circumscribed sub cm low-attenuation areas around the gallbladder fossa were likely present previously, compatible with benign entity such is cysts.  Biliary: Cholelithiasis. There is gallbladder distention and gallbladder wall thickening with probable mild pericholecystic fat haziness. No biliary ductal enlargement.  Pancreas: Unremarkable.  Spleen: Numerous coarse calcifications consistent with previous granulomatous infection.  Adrenals: Unchanged 18 mm nodule in the medial limb right adrenal gland, consistent with adenoma.  Kidneys and ureters: Numerous low dense renal masses, size similar to priors when accounting for long interval. A 60 Hounsfield unit, 12 mm diameter nodule from the low interpolar left kidney is unchanged in size and density, most likely hemorrhagic cyst.  There is a notably large cyst from the lower pole right kidney, 11 cm in diameter. No hydronephrosis.  Bladder: Unremarkable.  Bowel: No obstruction.   Extensive colonic diverticulosis.  Retroperitoneum: No mass or adenopathy.  Peritoneum: No free fluid or gas.  Reproductive: Prostate brachytherapy seeds.  Vascular: No acute abnormality.  OSSEOUS: No acute abnormalities. No suspicious lytic or blastic lesions.  IMPRESSION: 1. In the appropriate clinical setting, findings consistent with acute cholecystitis (gallbladder distention, cholelithiasis, and gallbladder wall thickening). 2. Bilateral renal cystic disease, appearance stable from 2010. 3. Colonic diverticulosis. 4. 18 mm right adrenal adenoma.   Electronically Signed   By: Tiburcio Pea   On: 06/20/2013 05:25     Date: 06/20/2013  Rate: 79  Rhythm: normal sinus rhythm and premature ventricular contractions (PVC)  QRS Axis: normal  Intervals: normal  ST/T Wave abnormalities: nonspecific ST/T changes  Conduction Disutrbances:nonspecific intraventricular conduction delay  Narrative Interpretation:  Frequent PVCs, nonspecific intraventricular conduction delay, nonspecific ST and T changes. When compared with ECG of 03/28/2013, nonspecific ST and T changes are more pronounced.  Old EKG Reviewed: changes noted   MDM   1. Cholecystitis   2. Total bilirubin, elevated    Abdominal pain of uncertain cause. Possible biliary colic. However, anorexia and weight loss and scar scleral icterus or worrisome for other intra-abdominal pathology. Old records are reviewed and he has numerous outpatient visits to cardiology for coronary artery disease status post bypass x2. CT of abdomen and pelvis in 2010 did show evidence of gallstones. Limited bedside ultrasound was done by me showing what appeared to be a somewhat distended gallbladder but I could not identify any stones. Gallbladder wall is thickened but there is no pericholecystic fluid. Images were saved. Laboratory workup is initiated and will be sent for CT of the abdomen and pelvis.  CT scan also shows distended gallbladder with thickened wall but gallstones are clearly visible. This seems consistent with cholecystitis. Bilirubin has come back elevated at 2.2 but transaminases and alkaline phosphatase are normal. Case is discussed with Dr. Luisa Hart of [facility] who agrees to come and evaluate the patient.  Dione Booze, MD 06/20/13 [phone]

## 2013-06-20 NOTE — ED Notes (Signed)
Pt c/o upper abd pain, abd distention, nausea, mid back pain, diaphoresis, indigestion, and weakness starting Sunday morning. Pt denies chest pain

## 2013-06-20 NOTE — Progress Notes (Signed)
Pt has temp of 102.9 Dr Thedore Mins made aware and Dr Lindie Spruce of surgery. Orders received.

## 2013-06-20 NOTE — Consult Note (Signed)
[name] [name] 1931-10-29  [id number].   Primary Care MD: [place] (cardiologist) Requesting MD: Preston Fleeting Chief Complaint/Reason for Consult: RLQ abdominal pain with  HPI: 79 y.o. male with complex PMH including h/o CAD s/p CABG x 2 and CEA x 2 now being managed conservatively with plavix after 2nd failed CABG, presenting with severe intermittent right-sided abdominal pain x3 days and CT abdomen showing acute cholecystitis. Patient reports pain started as intermittent sharp pain 3 nights ago and continued since that time. He has had some chills and nausea but denies fevers, emesis, diarrhea, change in bowels, chest pain, or shortness of breath. Pain is worse with deep inspiration. He has never had pain like this before. It has prevented him from eating. He tried to go to work 2 days ago but had to leave early and was unable to go yesterday due to pain.    Family History  Problem Relation Age of Onset  . Other Father 47    CEREBRAL HEMORRHAGE  . Cancer Mother 58    Past Medical History  Diagnosis Date  . CAD (coronary artery disease)     REDO CABG 2007. SUBSEQUENT OCCLUSION OF ALL VEIN GRAFTS.   Marland Kitchen HTN (hypertension)   . Hypercholesteremia   . Chronic renal insufficiency   . Left ventricular dysfunction     MODERATE  . Gout   . History of TIAs 2000  . Prostate cancer   . Carotid arterial disease   . Chronic systolic CHF (congestive heart failure) [id number]  Patient reports HTN and HLD are well-controlled on medication  Past Surgical History  Procedure Laterality Date  . Coronary artery bypass graft  [id number]  . Carotid endarterectomy  1992, 2007    LEFT, RIGHT  . Back surgery    . Radioactive seed implant  2005  . Knee arthroscopy      RIGHT KNEE  . Cardiovascular stress test  04-02-08    EF 30%    Social History:  reports that he quit smoking about 26 years ago. His smoking use included Cigarettes. He smoked 0.00 packs per day. He does not have any smokeless tobacco history  on file. He reports that he does not drink alcohol or use illicit drugs. Has a wife and three children Works Mondays and Tuesdays at [employer] Likes to play golf  Allergies: No Known Allergies   (Not in a hospital admission)  Blood pressure 158/63, pulse 81, temperature 98 F (36.7 C), temperature source Oral, resp. rate 18, height 6\' 1"  (1.854 m), weight 180 lb (81.647 kg), SpO2 94.00%. Physical Exam: General: pleasant, WD, WN white male who is laying in bed in NAD HEENT: head is normocephalic, atraumatic.  Sclera are noninjected.  Ears and nose without any masses or lesions.  Mouth is pink and mildly dry Heart: regular, rate, and rhythm.  Normal s1,s2. II/VI systolic murmurs, no gallops, or rubs noted.  Palpable radial and pedal pulses bilaterally, carotid bruit present bilaterally Lungs: CTAB, no wheezes, rhonchi, or rales noted.  Respiratory effort nonlabored Abd: soft, mildly distended, moderate tenderness with voluntary guarding RLQ, hypoactive bowel sounds, no organomegaly appreciated  MS: all 4 extremities are symmetrical with no cyanosis, clubbing, or edema. Skin: warm and dry with no masses, lesions, or rashes Psych: A&Ox3 with an appropriate affect and appropriate insight   Results for orders placed during the hospital encounter of 06/20/13 (from the past 48 hour(s))  CBC WITH DIFFERENTIAL     Status: Abnormal   Collection Time  06/20/13  2:50 AM      Result Value Range   WBC 8.8  4.0 - 10.5 K/uL   RBC 4.28  4.22 - 5.81 MIL/uL   Hemoglobin 13.2  13.0 - 17.0 g/dL   HCT 21.3 (*) 08.6 - 57.8 %   MCV 87.6  78.0 - 100.0 fL   MCH 30.8  26.0 - 34.0 pg   MCHC 35.2  30.0 - 36.0 g/dL   RDW 46.9  62.9 - 52.8 %   Platelets 243  150 - 400 K/uL   Neutrophils Relative % 83 (*) 43 - 77 %   Neutro Abs 7.3  1.7 - 7.7 K/uL   Lymphocytes Relative 11 (*) 12 - 46 %   Lymphs Abs 0.9  0.7 - 4.0 K/uL   Monocytes Relative 6  3 - 12 %   Monocytes Absolute 0.5  0.1 - 1.0 K/uL    Eosinophils Relative 1  0 - 5 %   Eosinophils Absolute 0.1  0.0 - 0.7 K/uL   Basophils Relative 0  0 - 1 %   Basophils Absolute 0.0  0.0 - 0.1 K/uL  COMPREHENSIVE METABOLIC PANEL     Status: Abnormal   Collection Time    06/20/13  2:50 AM      Result Value Range   Sodium 138  135 - 145 mEq/L   Potassium 3.7  3.5 - 5.1 mEq/L   Chloride 100  96 - 112 mEq/L   CO2 24  19 - 32 mEq/L   Glucose, Bld 145 (*) 70 - 99 mg/dL   BUN 19  6 - 23 mg/dL   Creatinine, Ser 4.13  0.50 - 1.35 mg/dL   Calcium 9.2  8.4 - 24.4 mg/dL   Total Protein 6.9  6.0 - 8.3 g/dL   Albumin 3.7  3.5 - 5.2 g/dL   AST 19  0 - 37 U/L   ALT 15  0 - 53 U/L   Alkaline Phosphatase 108  39 - 117 U/L   Total Bilirubin 2.2 (*) 0.3 - 1.2 mg/dL   GFR calc non Af Amer 65 (*) >90 mL/min   GFR calc Af Amer 76 (*) >90 mL/min   Comment: (NOTE)     The eGFR has been calculated using the CKD EPI equation.     This calculation has not been validated in all clinical situations.     eGFR's persistently <90 mL/min signify possible Chronic Kidney     Disease.  LIPASE, BLOOD     Status: None   Collection Time    06/20/13  2:50 AM      Result Value Range   Lipase 59  11 - 59 U/L  POCT I-STAT TROPONIN I     Status: None   Collection Time    06/20/13  2:54 AM      Result Value Range   Troponin i, poc 0.02  0.00 - 0.08 ng/mL   Comment 3            Comment: Due to the release kinetics of cTnI,     a negative result within the first hours     of the onset of symptoms does not rule out     myocardial infarction with certainty.     If myocardial infarction is still suspected,     repeat the test at appropriate intervals.  URINALYSIS, ROUTINE W REFLEX MICROSCOPIC     Status: Abnormal   Collection Time    06/20/13  6:59  AM      Result Value Range   Color, Urine YELLOW  YELLOW   APPearance CLEAR  CLEAR   Specific Gravity, Urine 1.015  1.005 - 1.030   pH 6.0  5.0 - 8.0   Glucose, UA NEGATIVE  NEGATIVE mg/dL   Hgb urine dipstick  NEGATIVE  NEGATIVE   Bilirubin Urine NEGATIVE  NEGATIVE   Ketones, ur NEGATIVE  NEGATIVE mg/dL   Protein, ur 829 (*) NEGATIVE mg/dL   Urobilinogen, UA 0.2  0.0 - 1.0 mg/dL   Nitrite NEGATIVE  NEGATIVE   Leukocytes, UA NEGATIVE  NEGATIVE  URINE MICROSCOPIC-ADD ON     Status: None   Collection Time    06/20/13  6:59 AM      Result Value Range   WBC, UA 0-2  <3 WBC/hpf   Urine-Other MUCOUS PRESENT     Ct Abdomen Pelvis W Contrast  06/20/2013   CLINICAL DATA:  Abdominal pain.  EXAM: CT ABDOMEN AND PELVIS WITH CONTRAST  TECHNIQUE: Multidetector CT imaging of the abdomen and pelvis was performed using the standard protocol following bolus administration of intravenous contrast.  CONTRAST:  OMNIPAQUE IOHEXOL 300 MG/ML  SOLN  COMPARISON:  02/23/2009.  FINDINGS: BODY WALL: Unremarkable.  LOWER CHEST:  Mediastinum: Coronary artery atherosclerosis and aortic annular calcification. Mild cardiomegaly.  Lungs/pleura: Calcified right hilar lymph nodes.  ABDOMEN/PELVIS:  Liver: Coarse calcification noted. Circumscribed sub cm low-attenuation areas around the gallbladder fossa were likely present previously, compatible with benign entity such is cysts.  Biliary: Cholelithiasis. There is gallbladder distention and gallbladder wall thickening with probable mild pericholecystic fat haziness. No biliary ductal enlargement.  Pancreas: Unremarkable.  Spleen: Numerous coarse calcifications consistent with previous granulomatous infection.  Adrenals: Unchanged 18 mm nodule in the medial limb right adrenal gland, consistent with adenoma.  Kidneys and ureters: Numerous low dense renal masses, size similar to priors when accounting for long interval. A 60 Hounsfield unit, 12 mm diameter nodule from the low interpolar left kidney is unchanged in size and density, most likely hemorrhagic cyst. There is a notably large cyst from the lower pole right kidney, 11 cm in diameter. No hydronephrosis.  Bladder: Unremarkable.  Bowel:  No obstruction.   Extensive colonic diverticulosis.  Retroperitoneum: No mass or adenopathy.  Peritoneum: No free fluid or gas.  Reproductive: Prostate brachytherapy seeds.  Vascular: No acute abnormality.  OSSEOUS: No acute abnormalities. No suspicious lytic or blastic lesions.  IMPRESSION: 1. In the appropriate clinical setting, findings consistent with acute cholecystitis (gallbladder distention, cholelithiasis, and gallbladder wall thickening). 2. Bilateral renal cystic disease, appearance stable from 2010. 3. Colonic diverticulosis. 4. 18 mm right adrenal adenoma.   Electronically Signed   By: Tiburcio Pea   On: 06/20/2013 05:25       Assessment/Plan 79 y.o. male with h/o CAD s/p CABG x 2 and CEA x 2 now being managed conservatively with plavix after 2nd failed CABG, presenting with right-sided abdominal pain x4 days and CT abdomen showing acute cholecystitis.  1. Cholecystitis 2. CAD s/p CABG 1997 and redo CABG 2007 with subsequent occlusion of all vein grafts 3. Carotid arterial disease with h/o CEA x 2 4. HLD 5. Chronic renal insufficiency 6. Chronic systolic CHF with moderate L ventricular dysfunction, EF 30% from 03/2008 cardiovascular stress test 7. HTN 8. H/o TIAs 9. Prostate cancer with radioactive seed implant 2005  Plan: - Given extent of cardiovascular disease now being managed conservatively with plavix, would need cardiac evaluation and clearance to take off  plavix and take to OR prior to considering surgery. Discussed with Ward Givens with [facility] who stated plavix could likely be held given last CV event was >3 years ago. - Medicine service to evaluate and admit - May have clear liquids from surgical standpoint unless deemed otherwise by primary admitting team or cardiology  - IV hydration - Zofran prn nausea, morphine prn pain - Will start unasyn - Discussed plan with patient and wife and they voiced understanding.  Leona Singleton, MD 06/20/2013, 8:33  AM Pager: [phone]

## 2013-06-21 DIAGNOSIS — I517 Cardiomegaly: Secondary | ICD-10-CM

## 2013-06-21 DIAGNOSIS — I10 Essential (primary) hypertension: Secondary | ICD-10-CM

## 2013-06-21 DIAGNOSIS — N179 Acute kidney failure, unspecified: Secondary | ICD-10-CM

## 2013-06-21 LAB — CBC
Hemoglobin: 11.3 g/dL — ABNORMAL LOW (ref 13.0–17.0)
MCH: 29.7 pg (ref 26.0–34.0)
MCHC: 33.4 g/dL (ref 30.0–36.0)
MCV: 88.9 fL (ref 78.0–100.0)
Platelets: 234 10*3/uL (ref 150–400)
RBC: 3.8 MIL/uL — ABNORMAL LOW (ref 4.22–5.81)
RDW: 15.5 % (ref 11.5–15.5)
WBC: 9.2 10*3/uL (ref 4.0–10.5)

## 2013-06-21 LAB — BASIC METABOLIC PANEL
CO2: 25 mEq/L (ref 19–32)
Chloride: 94 mEq/L — ABNORMAL LOW (ref 96–112)
Creatinine, Ser: 1.82 mg/dL — ABNORMAL HIGH (ref 0.50–1.35)
Glucose, Bld: 136 mg/dL — ABNORMAL HIGH (ref 70–99)
Potassium: 3.4 mEq/L — ABNORMAL LOW (ref 3.5–5.1)
Sodium: 130 mEq/L — ABNORMAL LOW (ref 135–145)

## 2013-06-21 LAB — HEPATIC FUNCTION PANEL
AST: 19 U/L (ref 0–37)
Albumin: 2.7 g/dL — ABNORMAL LOW (ref 3.5–5.2)
Bilirubin, Direct: 0.3 mg/dL (ref 0.0–0.3)
Total Protein: 6 g/dL (ref 6.0–8.3)

## 2013-06-21 MED ORDER — KCL IN DEXTROSE-NACL 10-5-0.45 MEQ/L-%-% IV SOLN: INTRAVENOUS | Status: DC

## 2013-06-21 MED ORDER — SODIUM CHLORIDE 0.9 % IV SOLN
INTRAVENOUS | Status: DC
  Administered 2013-06-21 – 2013-06-23 (×2): via INTRAVENOUS

## 2013-06-21 NOTE — Progress Notes (Signed)
Pt a/o, no c/o pain, pt started on NS @ 23ml/hr, pt will be NPO at midnight for possible surgery in the am, abx given as ordered, will continue to monitor

## 2013-06-21 NOTE — Progress Notes (Signed)
TELEMETRY: Reviewed telemetry pt in NSR: Filed Vitals:   06/20/13 1840 06/20/13 1959 06/21/13 0500 06/21/13 0528  BP: 116/49 120/51  117/51  Pulse: 80 79  64  Temp: 102.9 F (39.4 C) 100.4 F (38 C)  98.9 F (37.2 C)  TempSrc: Oral Oral  Oral  Resp: 20 19  19   Height:      Weight:   82.3 kg (181 lb 7 oz)   SpO2: 98% 91%  92%    Intake/Output Summary (Last 24 hours) at 06/21/13 0840 Last data filed at 06/21/13 0800  Gross per 24 hour  Intake    660 ml  Output    525 ml  Net    135 ml    SUBJECTIVE Complains of abdominal pain and bloating. No cardiac complaints.   LABS: Basic Metabolic Panel:  Recent Labs  16/10/96 0250  NA 138  K 3.7  CL 100  CO2 24  GLUCOSE 145*  BUN 19  CREATININE 1.06  CALCIUM 9.2   Liver Function Tests:  Recent Labs  06/20/13 0250  AST 19  ALT 15  ALKPHOS 108  BILITOT 2.2*  PROT 6.9  ALBUMIN 3.7    Recent Labs  06/20/13 0250  LIPASE 59   CBC:  Recent Labs  06/20/13 0250  WBC 8.8  NEUTROABS 7.3  HGB 13.2  HCT 37.5*  MCV 87.6  PLT 243   Radiology/Studies:  Ct Abdomen Pelvis W Contrast  06/20/2013   CLINICAL DATA:  Abdominal pain.  EXAM: CT ABDOMEN AND PELVIS WITH CONTRAST  TECHNIQUE: Multidetector CT imaging of the abdomen and pelvis was performed using the standard protocol following bolus administration of intravenous contrast.  CONTRAST:  OMNIPAQUE IOHEXOL 300 MG/ML  SOLN  COMPARISON:  02/23/2009.  FINDINGS: BODY WALL: Unremarkable.  LOWER CHEST:  Mediastinum: Coronary artery atherosclerosis and aortic annular calcification. Mild cardiomegaly.  Lungs/pleura: Calcified right hilar lymph nodes.  ABDOMEN/PELVIS:  Liver: Coarse calcification noted. Circumscribed sub cm low-attenuation areas around the gallbladder fossa were likely present previously, compatible with benign entity such is cysts.  Biliary: Cholelithiasis. There is gallbladder distention and gallbladder wall thickening with probable mild  pericholecystic fat haziness. No biliary ductal enlargement.  Pancreas: Unremarkable.  Spleen: Numerous coarse calcifications consistent with previous granulomatous infection.  Adrenals: Unchanged 18 mm nodule in the medial limb right adrenal gland, consistent with adenoma.  Kidneys and ureters: Numerous low dense renal masses, size similar to priors when accounting for long interval. A 60 Hounsfield unit, 12 mm diameter nodule from the low interpolar left kidney is unchanged in size and density, most likely hemorrhagic cyst. There is a notably large cyst from the lower pole right kidney, 11 cm in diameter. No hydronephrosis.  Bladder: Unremarkable.  Bowel: No obstruction.   Extensive colonic diverticulosis.  Retroperitoneum: No mass or adenopathy.  Peritoneum: No free fluid or gas.  Reproductive: Prostate brachytherapy seeds.  Vascular: No acute abnormality.  OSSEOUS: No acute abnormalities. No suspicious lytic or blastic lesions.  IMPRESSION: 1. In the appropriate clinical setting, findings consistent with acute cholecystitis (gallbladder distention, cholelithiasis, and gallbladder wall thickening). 2. Bilateral renal cystic disease, appearance stable from 2010. 3. Colonic diverticulosis. 4. 18 mm right adrenal adenoma.   Electronically Signed   By: Tiburcio Pea   On: 06/20/2013 05:25    PHYSICAL EXAM General: Well developed, well nourished, in no acute distress. Head: Normocephalic, atraumatic, sclera non-icteric, no xanthomas, nares are without discharge. Neck: Positive bilateral carotid bruits. JVD not elevated. Lungs: Clear  bilaterally to auscultation without wheezes, rales, or rhonchi. Breathing is unlabored. Heart: RRR S1 S2 without murmurs, rubs, or gallops.  Abdomen: Soft, tender in upper quadrants. non-distended with normoactive bowel sounds.  Msk:  Strength and tone appears normal for age. Extremities: No clubbing, cyanosis or edema.  Distal pedal pulses are 2+ and equal  bilaterally. Neuro: Alert and oriented X 3. Moves all extremities spontaneously. Psych:  Responds to questions appropriately with a normal affect.  ASSESSMENT AND PLAN: 1. Acute cholecystitis. 2. CAD s/p CABG. No active cardiac problems. 3. Chronic systolic CHF. Appears compensated on medical therapy. 4. CKD 5. Carotid arterial disease.  Plan: patient is cleared from cardiac standpoint for surgery. Will follow up Echo today.  Principal Problem:   Acute cholecystitis Active Problems:   CAD (coronary artery disease)   HTN (hypertension)   Hypercholesteremia   Left ventricular dysfunction   Chronic renal insufficiency   Gout   History of TIAs   Prostate cancer   Carotid arterial disease   Chronic systolic CHF (congestive heart failure)    Signed, Ryelle Ruvalcaba [place] MD,FACC 06/21/2013 8:44 AM

## 2013-06-21 NOTE — Progress Notes (Signed)
Subjective: Pain is a little better today.  +flatus, no bm since Monday.  Decreased appetite.  Voiding without difficulties.    Objective: Vital signs in last 24 hours: Temp:  [98.9 F (37.2 C)-102.9 F (39.4 C)] 98.9 F (37.2 C) (09/25 0528) Pulse Rate:  [61-80] 61 (09/25 0951) Resp:  [19-20] 19 (09/25 0528) BP: (116-135)/(49-53) 135/53 mmHg (09/25 0951) SpO2:  [91 %-98 %] 92 % (09/25 0528) Weight:  [181 lb 7 oz (82.3 kg)] 181 lb 7 oz (82.3 kg) (09/25 0500) Last BM Date: 06/18/13  Intake/Output from previous day: 09/24 0701 - 09/25 0700 In: 360 [P.O.:360] Out: 525 [Urine:525] Intake/Output this shift: Total I/O In: 300 [P.O.:300] Out: 150 [Urine:150]  General: alert and oriented. NAD. Cardio: s1s2 rrr.  2/6 SEM. No gallops or rubs  No edema. Lungs: CTA, no rales or wheezes. Abd: RUQ and voluntary guarding to RUQ.  +bs, obese, soft.  No evidence of peritonitis.    Lab Results:   Recent Labs  06/20/13 0250 06/21/13 0830  WBC 8.8 9.2  HGB 13.2 11.3*  HCT 37.5* 33.8*  PLT 243 234   BMET  Recent Labs  06/20/13 0250 06/21/13 0830  NA 138 130*  K 3.7 3.4*  CL 100 94*  CO2 24 25  GLUCOSE 145* 136*  BUN 19 30*  CREATININE 1.06 1.82*  CALCIUM 9.2 8.5   PT/INR  Recent Labs  06/20/13 1421  LABPROT 14.0  INR 1.10   Studies/Results: Ct Abdomen Pelvis W Contrast  06/20/2013   CLINICAL DATA:  Abdominal pain.  EXAM: CT ABDOMEN AND PELVIS WITH CONTRAST  TECHNIQUE: Multidetector CT imaging of the abdomen and pelvis was performed using the standard protocol following bolus administration of intravenous contrast.  CONTRAST:  OMNIPAQUE IOHEXOL 300 MG/ML  SOLN  COMPARISON:  02/23/2009.  FINDINGS: BODY WALL: Unremarkable.  LOWER CHEST:  Mediastinum: Coronary artery atherosclerosis and aortic annular calcification. Mild cardiomegaly.  Lungs/pleura: Calcified right hilar lymph nodes.  ABDOMEN/PELVIS:  Liver: Coarse calcification noted. Circumscribed sub cm  low-attenuation areas around the gallbladder fossa were likely present previously, compatible with benign entity such is cysts.  Biliary: Cholelithiasis. There is gallbladder distention and gallbladder wall thickening with probable mild pericholecystic fat haziness. No biliary ductal enlargement.  Pancreas: Unremarkable.  Spleen: Numerous coarse calcifications consistent with previous granulomatous infection.  Adrenals: Unchanged 18 mm nodule in the medial limb right adrenal gland, consistent with adenoma.  Kidneys and ureters: Numerous low dense renal masses, size similar to priors when accounting for long interval. A 60 Hounsfield unit, 12 mm diameter nodule from the low interpolar left kidney is unchanged in size and density, most likely hemorrhagic cyst. There is a notably large cyst from the lower pole right kidney, 11 cm in diameter. No hydronephrosis.  Bladder: Unremarkable.  Bowel: No obstruction.   Extensive colonic diverticulosis.  Retroperitoneum: No mass or adenopathy.  Peritoneum: No free fluid or gas.  Reproductive: Prostate brachytherapy seeds.  Vascular: No acute abnormality.  OSSEOUS: No acute abnormalities. No suspicious lytic or blastic lesions.  IMPRESSION: 1. In the appropriate clinical setting, findings consistent with acute cholecystitis (gallbladder distention, cholelithiasis, and gallbladder wall thickening). 2. Bilateral renal cystic disease, appearance stable from 2010. 3. Colonic diverticulosis. 4. 18 mm right adrenal adenoma.   Electronically Signed   By: Tiburcio Pea   On: 06/20/2013 05:25    Anti-infectives: Anti-infectives   Start     Dose/Rate Route Frequency Ordered Stop   06/20/13 2015  piperacillin-tazobactam (ZOSYN) IVPB 3.375 g  3.375 g 12.5 mL/hr over 240 Minutes Intravenous 3 times per day 06/20/13 1914     06/20/13 0900  Ampicillin-Sulbactam (UNASYN) 3 g in sodium chloride 0.9 % 100 mL IVPB  Status:  Discontinued     3 g 100 mL/hr over 60 Minutes Intravenous  Every 8 hours 06/20/13 0856 06/20/13 0856   06/20/13 0900  Ampicillin-Sulbactam (UNASYN) 3 g in sodium chloride 0.9 % 100 mL IVPB  Status:  Discontinued     3 g 100 mL/hr over 60 Minutes Intravenous Every 6 hours 06/20/13 0856 06/20/13 1844      Assessment/Plan: 79 y.o. male with h/o CAD s/p CABG x 2 and CEA x 2 now being managed conservatively with plavix after 2nd failed CABG, presented with right-sided abdominal pain x4 days and CT abdomen showing acute cholecystitis.   1. Cholecystitis  2. CAD s/p CABG 1997 and redo CABG 2007 with subsequent occlusion of all vein grafts  3. Carotid arterial disease with h/o CEA x 2  4. HLD  5. Chronic renal insufficiency  6. Chronic systolic CHF with moderate L ventricular dysfunction, EF 30% from 03/2008 cardiovascular stress test  7. HTN  8. H/o TIAs  9. Prostate cancer with radioactive seed implant 2005  Plan -cleared by cardiology as of this morning.  Scheduled for echo today. -he has been off Plavix since Monday.   -IV ATBX -may have clear liquid, NPO after midnight.  I will restart his fluids.  I will contact primary team regarding increased sCr. -place SCDs, VTE prophylaxis per primary team.  Pain control, antiemetics  -Bilirubin increased from 2.2--->2.5 today which is a bit concerning, ast/alt, alk phos remain normal.  This along with worsening sCr could hold up his surgery tomorrow.  Repeat labs in AM.  I will discuss with Dr. [name] if GI consult is needed prior to surgery which is tentatively scheduled for tomorrow.      LOS: 1 day    Ashok Norris ANP-BC Pager [phone]  06/21/2013 11:55 AM

## 2013-06-21 NOTE — Progress Notes (Signed)
[facility]                                                                                Patient Demographics  [name] [name], is a 79 y.o. male, DOB - [birth year], ZOX:[id number]  Admit date - 06/20/2013   Admitting Physician Leroy Sea, MD  Outpatient Primary MD for the patient is Juline Patch, MD  LOS - 1   Chief Complaint  Patient presents with  . Abdominal Pain        Assessment & Plan  1. Acute cholecystitis  Ultrasound showed acute cholecystitis. Patient has been cleared from cardiac standpoint. However still pending echocardiogram. Patient is scheduled to have surgery tomorrow Dr. [name] on 06/22/13.  Will continue Zosyn empirically.   Currently on pain control.  2. Acute on chronic renal failure  Creatinine found to be 1.82 today. We'll continue on IV fluids.  If not improved will obtain urine electrolytes and renal ultrasound.  3. CAD (coronary artery disease)  Patient is chest free at this time. Again cardiologist in consult and cleared him for surgery. We'll continue his beta blockers were statin along with baby aspirin. His Plavix was held and due to surgery.  4. HTN (hypertension)  Currently controlled. Continue Norvasc Imdur and beta blocker hydralazine.  5. Hypercholesteremia  Continue statin.  6.  Chronic systolic CHF (congestive heart failure)/Left ventricular dysfunction  Volume overload at this time we'll continue to monitor him. We'll be giving him IV fluids due to his acute kidney injury.  7. Gout, stable   8.  History of TIAs, stable   Code Status: Full  Family Communication: None  Disposition Plan: Admitted, will likely have surgery tomorrow.     Procedures None   Consults  Cardiology, Surgery   DVT Prophylaxis  SCDs   Lab Results  Component Value Date   PLT 234 06/21/2013    Medications  Scheduled Meds: . allopurinol  300 mg Oral Daily  . amLODipine  10 mg Oral Daily  . aspirin EC  81 mg Oral Daily  .  atorvastatin  80 mg Oral q1800  . benazepril  40 mg Oral Daily  . folic acid  1 mg Oral Daily  . hydrALAZINE  25 mg Oral TID  . isosorbide mononitrate  30 mg Oral BID  . [START ON 06/25/2013] methotrexate  10 mg Oral Q Mon  . metoprolol tartrate  12.5 mg Oral BID  . piperacillin-tazobactam (ZOSYN)  IV  3.375 g Intravenous Q8H  . sodium chloride  3 mL Intravenous Q12H  . [START ON 06/27/2013] Vitamin D (Ergocalciferol)  50,000 Units Oral Q14 Days   Continuous Infusions: . sodium chloride     PRN Meds:.acetaminophen, albuterol, guaiFENesin-dextromethorphan, HYDROcodone-acetaminophen, morphine injection, nitroGLYCERIN, ondansetron (ZOFRAN) IV, ondansetron  Antibiotics    Anti-infectives   Start     Dose/Rate Route Frequency Ordered Stop   06/20/13 2015  piperacillin-tazobactam (ZOSYN) IVPB 3.375 g     3.375 g 12.5 mL/hr over 240 Minutes Intravenous 3 times per day 06/20/13 1914     06/20/13 0900  Ampicillin-Sulbactam (UNASYN) 3 g in sodium chloride 0.9 % 100 mL IVPB  Status:  Discontinued     3  g 100 mL/hr over 60 Minutes Intravenous Every 8 hours 06/20/13 0856 06/20/13 0856   06/20/13 0900  Ampicillin-Sulbactam (UNASYN) 3 g in sodium chloride 0.9 % 100 mL IVPB  Status:  Discontinued     3 g 100 mL/hr over 60 Minutes Intravenous Every 6 hours 06/20/13 0856 06/20/13 1844       Time Spent in minutes   35 minutes   Prestina Raigoza D.O. on 06/21/2013 at 12:37 PM  Between 7am to 7pm - Pager - [phone]  After 7pm go to [email or web address] - password [email or web address]  And look for the night coverage person covering for me after hours  [facility] Office  [phone]    Subjective:   [name] [name] today has, No headache, No chest pain,  No Nausea, No new weakness tingling or numbness, No Cough - SOB.  Patient does complain of abdominal pain. However only complains when he is actively moving.    Objective:   Filed Vitals:   06/20/13 1959 06/21/13 0500 06/21/13 0528 06/21/13  0951  BP: 120/51  117/51 135/53  Pulse: 79  64 61  Temp: 100.4 F (38 C)  98.9 F (37.2 C)   TempSrc: Oral  Oral   Resp: 19  19   Height:      Weight:  82.3 kg (181 lb 7 oz)    SpO2: 91%  92%     Wt Readings from Last 3 Encounters:  06/21/13 82.3 kg (181 lb 7 oz)  03/28/13 85.639 kg (188 lb 12.8 oz)  08/25/12 85.911 kg (189 lb 6.4 oz)     Intake/Output Summary (Last 24 hours) at 06/21/13 1237 Last data filed at 06/21/13 0900  Gross per 24 hour  Intake    660 ml  Output    675 ml  Net    -15 ml    Exam General: Awake Alert, Oriented X 3, No new F.N deficits, Normal affect  HEENT: [place]/AT,PERRAL  Neck: Supple Neck,No JVD, No cervical lymphadenopathy appriciated.  Respiratory: Symmetrical Chest wall movement, Good air movement bilaterally, CTAB  Cardiovascular: RRR. No Gallops,Rubs or new Murmurs  Abdomen: Soft, tender to palpation of the right upper quadrant, nondistended, positive bowel sounds.  Extremities: No Cyanosis, Clubbing, edema Neuro: Grossly intact, nonfocal.    Data Review   Micro Results No results found for this or any previous visit (from the past 240 hour(s)).  Radiology Reports Ct Abdomen Pelvis W Contrast  06/20/2013   CLINICAL DATA:  Abdominal pain.  EXAM: CT ABDOMEN AND PELVIS WITH CONTRAST  TECHNIQUE: Multidetector CT imaging of the abdomen and pelvis was performed using the standard protocol following bolus administration of intravenous contrast.  CONTRAST:  OMNIPAQUE IOHEXOL 300 MG/ML  SOLN  COMPARISON:  02/23/2009.  FINDINGS: BODY WALL: Unremarkable.  LOWER CHEST:  Mediastinum: Coronary artery atherosclerosis and aortic annular calcification. Mild cardiomegaly.  Lungs/pleura: Calcified right hilar lymph nodes.  ABDOMEN/PELVIS:  Liver: Coarse calcification noted. Circumscribed sub cm low-attenuation areas around the gallbladder fossa were likely present previously, compatible with benign entity such is cysts.  Biliary: Cholelithiasis. There is  gallbladder distention and gallbladder wall thickening with probable mild pericholecystic fat haziness. No biliary ductal enlargement.  Pancreas: Unremarkable.  Spleen: Numerous coarse calcifications consistent with previous granulomatous infection.  Adrenals: Unchanged 18 mm nodule in the medial limb right adrenal gland, consistent with adenoma.  Kidneys and ureters: Numerous low dense renal masses, size similar to priors when accounting for long interval. A 60 Hounsfield unit, 12 mm diameter  nodule from the low interpolar left kidney is unchanged in size and density, most likely hemorrhagic cyst. There is a notably large cyst from the lower pole right kidney, 11 cm in diameter. No hydronephrosis.  Bladder: Unremarkable.  Bowel: No obstruction.   Extensive colonic diverticulosis.  Retroperitoneum: No mass or adenopathy.  Peritoneum: No free fluid or gas.  Reproductive: Prostate brachytherapy seeds.  Vascular: No acute abnormality.  OSSEOUS: No acute abnormalities. No suspicious lytic or blastic lesions.  IMPRESSION: 1. In the appropriate clinical setting, findings consistent with acute cholecystitis (gallbladder distention, cholelithiasis, and gallbladder wall thickening). 2. Bilateral renal cystic disease, appearance stable from 2010. 3. Colonic diverticulosis. 4. 18 mm right adrenal adenoma.   Electronically Signed   By: Tiburcio Pea   On: 06/20/2013 05:25    CBC  Recent Labs Lab 06/20/13 0250 06/21/13 0830  WBC 8.8 9.2  HGB 13.2 11.3*  HCT 37.5* 33.8*  PLT 243 234  MCV 87.6 88.9  MCH 30.8 29.7  MCHC 35.2 33.4  RDW 14.9 15.5  LYMPHSABS 0.9  --   MONOABS 0.5  --   EOSABS 0.1  --   BASOSABS 0.0  --     Chemistries   Recent Labs Lab 06/20/13 0250 06/21/13 0830  NA 138 130*  K 3.7 3.4*  CL 100 94*  CO2 24 25  GLUCOSE 145* 136*  BUN 19 30*  CREATININE 1.06 1.82*  CALCIUM 9.2 8.5  AST 19 19  ALT 15 13  ALKPHOS 108 95  BILITOT 2.2* 2.5*    ------------------------------------------------------------------------------------------------------------------ estimated creatinine clearance is 37.8 ml/min (by C-G formula based on Cr of 1.82). ------------------------------------------------------------------------------------------------------------------ No results found for this basename: HGBA1C,  in the last 72 hours ------------------------------------------------------------------------------------------------------------------ No results found for this basename: CHOL, HDL, LDLCALC, TRIG, CHOLHDL, LDLDIRECT,  in the last 72 hours ------------------------------------------------------------------------------------------------------------------ No results found for this basename: TSH, T4TOTAL, FREET3, T3FREE, THYROIDAB,  in the last 72 hours ------------------------------------------------------------------------------------------------------------------ No results found for this basename: VITAMINB12, FOLATE, FERRITIN, TIBC, IRON, RETICCTPCT,  in the last 72 hours  Coagulation profile  Recent Labs Lab 06/20/13 1421  INR 1.10    No results found for this basename: DDIMER,  in the last 72 hours  Cardiac Enzymes No results found for this basename: CK, CKMB, TROPONINI, MYOGLOBIN,  in the last 168 hours ------------------------------------------------------------------------------------------------------------------ No components found with this basename: POCBNP,

## 2013-06-21 NOTE — Progress Notes (Signed)
Pt with 100 mL urine this AM. Bladder scan reveals 383 mL urine. Pt states baseline of urinating only twice per day. Pt also states he doesn't begin urinating until "after he starts getting up and moving around in the AM". Will notify MD via Amion and continue to monitor.

## 2013-06-21 NOTE — Progress Notes (Signed)
  Echocardiogram 2D Echocardiogram has been performed.  Nereida Schepp FRANCES 06/21/2013, 12:17 PM

## 2013-06-21 NOTE — Progress Notes (Signed)
Patient seen and examined. Abdomen soft. Mild right upper quadrant tenderness  Acute cholecystitis Elevated bilirubin Increased creatinine  Right now still planning for surgery tomorrow for gallbladder; however, explained to patient that if his creatinine continues to increase we may need to postpone surgery in order to minimize further insult to his kidney function. Also if LFTs continue to increase may need to also reevaluate timing of surgery  N.p.o. After midnight Repeat labs in the a.m.  Mary Sella. [name], MD, FACS General, Bariatric, & Minimally Invasive Surgery [facility], [place]

## 2013-06-22 ENCOUNTER — Encounter (HOSPITAL_COMMUNITY): Payer: Self-pay | Admitting: Anesthesiology

## 2013-06-22 ENCOUNTER — Inpatient Hospital Stay (HOSPITAL_COMMUNITY): Payer: Medicare Other

## 2013-06-22 ENCOUNTER — Inpatient Hospital Stay (HOSPITAL_COMMUNITY): Payer: Medicare Other | Admitting: Anesthesiology

## 2013-06-22 ENCOUNTER — Encounter (HOSPITAL_COMMUNITY)
Admission: EM | Disposition: A | Discharge status: Home / Self Care | Payer: Self-pay | Source: Home / Self Care | Attending: Internal Medicine

## 2013-06-22 DIAGNOSIS — K8010 Calculus of gallbladder with chronic cholecystitis without obstruction: Secondary | ICD-10-CM

## 2013-06-22 HISTORY — PX: CHOLECYSTECTOMY: SHX55

## 2013-06-22 LAB — COMPREHENSIVE METABOLIC PANEL
Alkaline Phosphatase: 93 U/L (ref 39–117)
BUN: 28 mg/dL — ABNORMAL HIGH (ref 6–23)
Calcium: 8.2 mg/dL — ABNORMAL LOW (ref 8.4–10.5)
Chloride: 96 mEq/L (ref 96–112)
GFR calc Af Amer: 50 mL/min — ABNORMAL LOW (ref >90)
Glucose, Bld: 114 mg/dL — ABNORMAL HIGH (ref 70–99)
Potassium: 3.3 mEq/L — ABNORMAL LOW (ref 3.5–5.1)
Total Bilirubin: 2.3 mg/dL — ABNORMAL HIGH (ref 0.3–1.2)
Total Protein: 5.7 g/dL — ABNORMAL LOW (ref 6.0–8.3)

## 2013-06-22 LAB — CBC
HCT: 30.8 % — ABNORMAL LOW (ref 39.0–52.0)
Hemoglobin: 10.7 g/dL — ABNORMAL LOW (ref 13.0–17.0)
MCHC: 34.7 g/dL (ref 30.0–36.0)
WBC: 8.9 10*3/uL (ref 4.0–10.5)

## 2013-06-22 SURGERY — LAPAROSCOPIC CHOLECYSTECTOMY
Anesthesia: General | Site: Abdomen | Wound class: Dirty or Infected

## 2013-06-22 MED ORDER — PROPOFOL 10 MG/ML IV BOLUS
INTRAVENOUS | Status: DC | PRN
  Administered 2013-06-22: 150 mg via INTRAVENOUS

## 2013-06-22 MED ORDER — ROCURONIUM BROMIDE 100 MG/10ML IV SOLN
INTRAVENOUS | Status: DC | PRN
  Administered 2013-06-22: 50 mg via INTRAVENOUS

## 2013-06-22 MED ORDER — FENTANYL CITRATE 0.05 MG/ML IJ SOLN
25.0000 ug | INTRAMUSCULAR | Status: DC | PRN
  Administered 2013-06-22 (×4): 25 ug via INTRAVENOUS

## 2013-06-22 MED ORDER — SODIUM CHLORIDE 0.9 % IR SOLN
Status: DC | PRN
  Administered 2013-06-22: 1000 mL

## 2013-06-22 MED ORDER — FENTANYL CITRATE 0.05 MG/ML IJ SOLN
INTRAMUSCULAR | Status: AC
  Administered 2013-06-22: 25 ug via INTRAVENOUS
  Filled 2013-06-22: qty 2

## 2013-06-22 MED ORDER — BUPIVACAINE-EPINEPHRINE PF 0.25-1:200000 % IJ SOLN
INTRAMUSCULAR | Status: AC
  Filled 2013-06-22: qty 30

## 2013-06-22 MED ORDER — HEMOSTATIC AGENTS (NO CHARGE) OPTIME
TOPICAL | Status: DC | PRN
  Administered 2013-06-22: 2 via TOPICAL

## 2013-06-22 MED ORDER — LACTATED RINGERS IV SOLN
INTRAVENOUS | Status: DC
  Administered 2013-06-22 – 2013-06-23 (×2): via INTRAVENOUS

## 2013-06-22 MED ORDER — NEOSTIGMINE METHYLSULFATE 1 MG/ML IJ SOLN
INTRAMUSCULAR | Status: DC | PRN
  Administered 2013-06-22: 2 mg via INTRAVENOUS

## 2013-06-22 MED ORDER — LACTATED RINGERS IV SOLN
INTRAVENOUS | Status: DC | PRN
  Administered 2013-06-22: 12:00:00 via INTRAVENOUS

## 2013-06-22 MED ORDER — FENTANYL CITRATE 0.05 MG/ML IJ SOLN
INTRAMUSCULAR | Status: DC | PRN
  Administered 2013-06-22: 100 ug via INTRAVENOUS
  Administered 2013-06-22 (×3): 50 ug via INTRAVENOUS

## 2013-06-22 MED ORDER — BUPIVACAINE-EPINEPHRINE 0.25% -1:200000 IJ SOLN
INTRAMUSCULAR | Status: DC | PRN
  Administered 2013-06-22: 14 mL

## 2013-06-22 MED ORDER — POTASSIUM CHLORIDE CRYS ER 20 MEQ PO TBCR
40.0000 meq | EXTENDED_RELEASE_TABLET | Freq: Once | ORAL | Status: AC
  Administered 2013-06-22: 40 meq via ORAL
  Filled 2013-06-22: qty 2

## 2013-06-22 MED ORDER — METOPROLOL TARTRATE 1 MG/ML IV SOLN: 2.5000 mg | Freq: Four times a day (QID) | INTRAVENOUS | Status: DC

## 2013-06-22 MED ORDER — GLYCOPYRROLATE 0.2 MG/ML IJ SOLN
INTRAMUSCULAR | Status: DC | PRN
  Administered 2013-06-22: 0.4 mg via INTRAVENOUS

## 2013-06-22 MED ORDER — PIPERACILLIN-TAZOBACTAM 3.375 G IVPB
3.3750 g | Freq: Three times a day (TID) | INTRAVENOUS | Status: DC
  Administered 2013-06-22 – 2013-06-25 (×8): 3.375 g via INTRAVENOUS
  Filled 2013-06-22 (×9): qty 50

## 2013-06-22 MED ORDER — PHENYLEPHRINE HCL 10 MG/ML IJ SOLN
INTRAMUSCULAR | Status: DC | PRN
  Administered 2013-06-22: 160 ug via INTRAVENOUS

## 2013-06-22 MED ORDER — SODIUM CHLORIDE 0.9 % IV SOLN
INTRAVENOUS | Status: DC | PRN
  Administered 2013-06-22: 12:00:00

## 2013-06-22 MED ORDER — LIDOCAINE HCL (CARDIAC) 20 MG/ML IV SOLN
INTRAVENOUS | Status: DC | PRN
  Administered 2013-06-22: 60 mg via INTRAVENOUS

## 2013-06-22 MED ORDER — POTASSIUM CHLORIDE 10 MEQ/100ML IV SOLN
10.0000 meq | Freq: Once | INTRAVENOUS | Status: AC
  Administered 2013-06-22: 10 meq via INTRAVENOUS
  Filled 2013-06-22: qty 100

## 2013-06-22 MED ORDER — MORPHINE SULFATE 2 MG/ML IJ SOLN: 2.0000 mg | INTRAMUSCULAR | Status: DC | PRN

## 2013-06-22 SURGICAL SUPPLY — 45 items
APL SKNCLS STERI-STRIP NONHPOA (GAUZE/BANDAGES/DRESSINGS) ×2
APPLIER CLIP 5 13 M/L LIGAMAX5 (MISCELLANEOUS) ×3
APR CLP MED LRG 5 ANG JAW (MISCELLANEOUS) ×2
BAG SPEC RTRVL LRG 6X4 10 (ENDOMECHANICALS) ×2
BANDAGE ADHESIVE 1X3 (GAUZE/BANDAGES/DRESSINGS) ×9 IMPLANT
BENZOIN TINCTURE PRP APPL 2/3 (GAUZE/BANDAGES/DRESSINGS) ×3 IMPLANT
BLADE SURG ROTATE 9660 (MISCELLANEOUS) IMPLANT
CANISTER SUCTION 2500CC (MISCELLANEOUS) ×3 IMPLANT
CHLORAPREP W/TINT 26ML (MISCELLANEOUS) ×3 IMPLANT
CLIP APPLIE 5 13 M/L LIGAMAX5 (MISCELLANEOUS) ×2 IMPLANT
CLOTH BEACON ORANGE TIMEOUT ST (SAFETY) ×3 IMPLANT
COVER MAYO STAND STRL (DRAPES) ×3 IMPLANT
COVER SURGICAL LIGHT HANDLE (MISCELLANEOUS) ×3 IMPLANT
DECANTER SPIKE VIAL GLASS SM (MISCELLANEOUS) ×3 IMPLANT
DRAPE C-ARM 42X72 X-RAY (DRAPES) ×3 IMPLANT
DRAPE UTILITY 15X26 W/TAPE STR (DRAPE) ×6 IMPLANT
DRSG TEGADERM 4X4.75 (GAUZE/BANDAGES/DRESSINGS) ×3 IMPLANT
ELECT REM PT RETURN 9FT ADLT (ELECTROSURGICAL) ×3
ELECTRODE REM PT RTRN 9FT ADLT (ELECTROSURGICAL) ×2 IMPLANT
GAUZE SPONGE 2X2 8PLY STRL LF (GAUZE/BANDAGES/DRESSINGS) ×2 IMPLANT
GLOVE BIOGEL M STRL SZ7.5 (GLOVE) ×3 IMPLANT
GLOVE BIOGEL PI IND STRL 8 (GLOVE) ×2 IMPLANT
GLOVE BIOGEL PI INDICATOR 8 (GLOVE) ×1
GOWN STRL NON-REIN LRG LVL3 (GOWN DISPOSABLE) ×9 IMPLANT
GOWN STRL REIN XL XLG (GOWN DISPOSABLE) ×3 IMPLANT
HEMOSTAT SNOW SURGICEL 2X4 (HEMOSTASIS) ×4 IMPLANT
KIT BASIN OR (CUSTOM PROCEDURE TRAY) ×3 IMPLANT
KIT ROOM TURNOVER OR (KITS) ×3 IMPLANT
NS IRRIG 1000ML POUR BTL (IV SOLUTION) ×3 IMPLANT
PAD ARMBOARD 7.5X6 YLW CONV (MISCELLANEOUS) ×3 IMPLANT
POUCH SPECIMEN RETRIEVAL 10MM (ENDOMECHANICALS) ×3 IMPLANT
SCISSORS LAP 5X35 DISP (ENDOMECHANICALS) IMPLANT
SET CHOLANGIOGRAPH 5 50 .035 (SET/KITS/TRAYS/PACK) ×3 IMPLANT
SET IRRIG TUBING LAPAROSCOPIC (IRRIGATION / IRRIGATOR) ×3 IMPLANT
SLEEVE ENDOPATH XCEL 5M (ENDOMECHANICALS) ×6 IMPLANT
SPECIMEN JAR SMALL (MISCELLANEOUS) ×3 IMPLANT
SPONGE GAUZE 2X2 STER 10/PKG (GAUZE/BANDAGES/DRESSINGS) ×1
SUT MNCRL AB 4-0 PS2 18 (SUTURE) ×3 IMPLANT
SUT VICRYL 0 UR6 27IN ABS (SUTURE) ×2 IMPLANT
TOWEL OR 17X24 6PK STRL BLUE (TOWEL DISPOSABLE) ×3 IMPLANT
TOWEL OR 17X26 10 PK STRL BLUE (TOWEL DISPOSABLE) ×3 IMPLANT
TRAY LAPAROSCOPIC (CUSTOM PROCEDURE TRAY) ×3 IMPLANT
TROCAR XCEL BLUNT TIP 100MML (ENDOMECHANICALS) ×3 IMPLANT
TROCAR XCEL NON-BLD 11X100MML (ENDOMECHANICALS) ×2 IMPLANT
TROCAR XCEL NON-BLD 5MMX100MML (ENDOMECHANICALS) ×3 IMPLANT

## 2013-06-22 NOTE — Anesthesia Procedure Notes (Signed)
Procedure Name: Intubation Date/Time: 06/22/2013 11:46 AM Performed by: Whitman Hero Pre-anesthesia Checklist: Patient identified, Emergency Drugs available, Suction available, Patient being monitored and Timeout performed Patient Re-evaluated:Patient Re-evaluated prior to inductionOxygen Delivery Method: Circle system utilized Preoxygenation: Pre-oxygenation with 100% oxygen Intubation Type: IV induction Ventilation: Mask ventilation without difficulty and Oral airway inserted - appropriate to patient size Laryngoscope Size: Mac and 3 Grade View: Grade IV Tube type: Oral Tube size: 7.5 mm Number of attempts: 3 Airway Equipment and Method: Stylet and Video-laryngoscopy Placement Confirmation: ETT inserted through vocal cords under direct vision,  positive ETCO2 and CO2 detector Secured at: 23 cm Tube secured with: Tape Dental Injury: Teeth and Oropharynx as per pre-operative assessment and Injury to lip  Difficulty Due To: Difficulty was unanticipated Future Recommendations: Recommend- induction with short-acting agent, and alternative techniques readily available

## 2013-06-22 NOTE — Progress Notes (Signed)
Subjective: Abdominal pain remains.  No chills or sweats, but febrile this morning.  No cp, SOB since IS use initiated.    Objective: Vital signs in last 24 hours: Temp:  [99.6 F (37.6 C)-102.8 F (39.3 C)] 100.4 F (38 C) (09/26 0534) Pulse Rate:  [69-77] 77 (09/26 0527) Resp:  [17-20] 18 (09/26 0527) BP: (119-137)/(46-53) 133/46 mmHg (09/26 0948) SpO2:  [91 %-93 %] 93 % (09/26 0527) Weight:  [182 lb 15.7 oz (83 kg)] 182 lb 15.7 oz (83 kg) (09/26 0527) Last BM Date: 06/18/13  Intake/Output from previous day: 09/25 0701 - 09/26 0700 In: 1500 [P.O.:1500] Out: 850 [Urine:850] Intake/Output this shift:   PE General: alert and oriented. NAD.  Cardio: s1s2 rrr. 2/6 SEM. No gallops or rubs No edema.  Lungs: CTA, no rales or wheezes.  Abd: RUQ and voluntary guarding to RUQ. +bs, obese, soft. No evidence of peritonitis.  Ext: +2 pulses no edema.  Lab Results:   Recent Labs  06/21/13 0830 06/22/13 0635  WBC 9.2 8.9  HGB 11.3* 10.7*  HCT 33.8* 30.8*  PLT 234 233   BMET  Recent Labs  06/21/13 0830 06/22/13 0635  NA 130* 131*  K 3.4* 3.3*  CL 94* 96  CO2 25 23  GLUCOSE 136* 114*  BUN 30* 28*  CREATININE 1.82* 1.48*  CALCIUM 8.5 8.2*   PT/INR  Recent Labs  06/20/13 1421  LABPROT 14.0  INR 1.10   ABG No results found for this basename: PHART, PCO2, PO2, HCO3,  in the last 72 hours  Studies/Results: No results found.  Anti-infectives: Anti-infectives   Start     Dose/Rate Route Frequency Ordered Stop   06/20/13 2015  piperacillin-tazobactam (ZOSYN) IVPB 3.375 g     3.375 g 12.5 mL/hr over 240 Minutes Intravenous 3 times per day 06/20/13 1914     06/20/13 0900  Ampicillin-Sulbactam (UNASYN) 3 g in sodium chloride 0.9 % 100 mL IVPB  Status:  Discontinued     3 g 100 mL/hr over 60 Minutes Intravenous Every 8 hours 06/20/13 0856 06/20/13 0856   06/20/13 0900  Ampicillin-Sulbactam (UNASYN) 3 g in sodium chloride 0.9 % 100 mL IVPB  Status:  Discontinued      3 g 100 mL/hr over 60 Minutes Intravenous Every 6 hours 06/20/13 0856 06/20/13 1844      Assessment/Plan: 79 y.o. male with h/o CAD s/p CABG x 2 and CEA x 2 now being managed conservatively with plavix after 2nd failed CABG, presented with right-sided abdominal pain x4 days and CT abdomen showing acute cholecystitis.    1. Cholecystitis   2. CAD s/p CABG 1997 and redo CABG 2007 with subsequent occlusion of all vein grafts   3. Carotid arterial disease with h/o CEA x 2   4. HLD   5. Chronic renal insufficiency   6. Chronic systolic CHF with moderate L ventricular dysfunction, EF 30% from 03/2008 cardiovascular stress test   7. HTN   8. H/o TIAs   9. Prostate cancer with radioactive seed implant 2005   Plan -WBC normal, febrile this morning, no tachycardia.  Continued abdominal pain.  cSr improved today. We would like to proceed with the surgery this morning. -awaiting cardiology acknowledgement of 2D echo and final clearance.  Spoke with Cards PA, awaiting callback -he has been off Plavix since Monday.    -IV ATBX - SCDs, VTE prophylaxis per primary team.   -Pain control, antiemetics       LOS: 2 days  Ashok Norris ANP-BC Pager [phone]  06/22/2013 9:53 AM

## 2013-06-22 NOTE — Progress Notes (Addendum)
[facility]                                                                                Patient Demographics  [name] [name], is a 79 y.o. male, DOB - 01-05-1932, ZOX:[id number]  Admit date - 06/20/2013   Admitting Physician Leroy Sea, MD  Outpatient Primary MD for the patient is Juline Patch, MD  LOS - 2   Chief Complaint  Patient presents with  . Abdominal Pain        Assessment & Plan  1. Acute cholecystitis  Ultrasound showed acute cholecystitis. Patient has been cleared from cardiac standpoint.  Patient is scheduled to have surgery (lap chole or open) today.  Will continue Zosyn empirically and pain control.   2. Acute on chronic renal failure, improving   Creatinine 1.48. Continue on IV fluids.  If not improved will obtain urine electrolytes and renal ultrasound.  3. CAD (coronary artery disease)  Patient is chest free at this time. Again cardiologist in consult and cleared him for surgery. We'll continue his beta blockers were statin along with baby aspirin. His Plavix was held and due to surgery.  4. HTN (hypertension)  Currently controlled. Continue Norvasc Imdur and beta blocker hydralazine.  5. Hypercholesteremia  Continue statin.  6.  Chronic systolic CHF (congestive heart failure)/Left ventricular dysfunction  Will continue to monitor carefully.  Patient has been receiving gentle rehydration with IVF for his acute kidney injury.    Echocardiogram shows  Study Conclusions Left ventricle: The cavity size was mildly dilated. Systolic function was mildly to moderately reduced. The estimated ejection fraction was in the range of 40% to 45%. Akinesis of the midanteroseptal myocardium. Akinesis of the basalinferior myocardium. There was an increased relative contribution of atrial contraction to ventricular filling.   7. Gout, stable   8.  History of TIAs, stable   Code Status: Full  Family Communication: None  Disposition Plan: Admitted,  will likely have surgery tomorrow.     Procedures Echocardiogram (as listed above)   Consults  Cardiology, Surgery   DVT Prophylaxis  SCDs   Lab Results  Component Value Date   PLT 233 06/22/2013    Medications  Scheduled Meds: . [facility] HOLD] allopurinol  300 mg Oral Daily  . [MAR HOLD] amLODipine  10 mg Oral Daily  . [facility] HOLD] aspirin EC  81 mg Oral Daily  . [facility] HOLD] atorvastatin  80 mg Oral q1800  . [MAR HOLD] benazepril  40 mg Oral Daily  . [facility] HOLD] folic acid  1 mg Oral Daily  . [facility] HOLD] hydrALAZINE  25 mg Oral TID  . [facility] HOLD] isosorbide mononitrate  30 mg Oral BID  . [facility] HOLD] methotrexate  10 mg Oral Q Mon  . [MAR HOLD] metoprolol  2.5 mg Intravenous Q6H  . [facility] HOLD] metoprolol tartrate  12.5 mg Oral BID  . [MAR HOLD] piperacillin-tazobactam (ZOSYN)  IV  3.375 g Intravenous Q8H  . [facility] HOLD] potassium chloride  10 mEq Intravenous Once  . [MAR HOLD] sodium chloride  3 mL Intravenous Q12H  . [MAR HOLD] Vitamin D (Ergocalciferol)  50,000 Units Oral Q14 Days   Continuous Infusions: . sodium  chloride 75 mL/hr at 06/21/13 1407  . lactated ringers 50 mL/hr at 06/22/13 1134   PRN Meds:.[MAR HOLD] acetaminophen, [MAR HOLD] albuterol, [MAR HOLD] guaiFENesin-dextromethorphan, [MAR HOLD] HYDROcodone-acetaminophen, [MAR HOLD]  morphine injection, [MAR HOLD] nitroGLYCERIN, [MAR HOLD] ondansetron (ZOFRAN) IV, [MAR HOLD] ondansetron, sodium chloride irrigation  Antibiotics    Anti-infectives   Start     Dose/Rate Route Frequency Ordered Stop   06/20/13 2015  [MAR Hold]  piperacillin-tazobactam (ZOSYN) IVPB 3.375 g     (On MAR Hold since 06/22/13 1058)   3.375 g 12.5 mL/hr over 240 Minutes Intravenous 3 times per day 06/20/13 1914     06/20/13 0900  Ampicillin-Sulbactam (UNASYN) 3 g in sodium chloride 0.9 % 100 mL IVPB  Status:  Discontinued     3 g 100 mL/hr over 60 Minutes Intravenous Every 8 hours 06/20/13 0856 06/20/13 0856   06/20/13 0900  Ampicillin-Sulbactam  (UNASYN) 3 g in sodium chloride 0.9 % 100 mL IVPB  Status:  Discontinued     3 g 100 mL/hr over 60 Minutes Intravenous Every 6 hours 06/20/13 0856 06/20/13 1844       Time Spent in minutes   20 minutes   Avory Mimbs D.O. on 06/22/2013 at 11:50 AM  Between 7am to 7pm - Pager - [phone]  After 7pm go to [email or web address] - password [email or web address]  And look for the night coverage person covering for me after hours  [facility] Office  [phone]    Subjective:   [name] [name] seen and evaluated today.  He has no complaints this morning, however is anxious about his surgery and would like to eat.  He denies dizziness, headache, chest pain, SOB, N/V/D/C.  Continues to have some abdominal pain.      Objective:   Filed Vitals:   06/21/13 2100 06/22/13 0527 06/22/13 0534 06/22/13 0948  BP: 135/53 137/53  133/46  Pulse: 76 77    Temp: 99.6 F (37.6 C) 102.8 F (39.3 C) 100.4 F (38 C)   TempSrc: Oral Oral Oral   Resp: 20 18    Height:      Weight:  83 kg (182 lb 15.7 oz)    SpO2: 92% 93%      Wt Readings from Last 3 Encounters:  06/22/13 83 kg (182 lb 15.7 oz)  06/22/13 83 kg (182 lb 15.7 oz)  03/28/13 85.639 kg (188 lb 12.8 oz)     Intake/Output Summary (Last 24 hours) at 06/22/13 1150 Last data filed at 06/22/13 0500  Gross per 24 hour  Intake   1200 ml  Output    700 ml  Net    500 ml    Exam General: Awake Alert, Oriented X 3, Normal affect  HEENT: [place]/AT,PERRAL  Neck: Supple Neck,No JVD, No cervical lymphadenopathy appriciated.  Respiratory: Symmetrical Chest wall movement, Good air movement bilaterally, CTAB  Cardiovascular: RRR. No Gallops,Rubs or new Murmurs  Abdomen: Soft, tender to palpation of the right upper quadrant, nondistended, positive bowel sounds, no organomegaly palpated.  Extremities: No Cyanosis, Clubbing, edema Neuro: Grossly intact, nonfocal.    Data Review   Micro Results Recent Results (from the past 240 hour(s))   CULTURE, BLOOD (ROUTINE X 2)     Status: None   Collection Time    06/20/13  7:41 PM      Result Value Range Status   Specimen Description BLOOD ARM LEFT   Final   Special Requests BOTTLES DRAWN AEROBIC AND ANAEROBIC 10CC   Final  Culture  Setup Time     Final   Value: 06/21/2013 00:34     Performed at [facility]   Culture     Final   Value:        BLOOD CULTURE RECEIVED NO GROWTH TO DATE CULTURE WILL BE HELD FOR 5 DAYS BEFORE ISSUING A FINAL NEGATIVE REPORT     Performed at [facility]   Report Status PENDING   Incomplete  CULTURE, BLOOD (ROUTINE X 2)     Status: None   Collection Time    06/20/13  7:49 PM      Result Value Range Status   Specimen Description BLOOD HAND LEFT   Final   Special Requests BOTTLES DRAWN AEROBIC AND ANAEROBIC 10CC   Final   Culture  Setup Time     Final   Value: 06/21/2013 00:35     Performed at [facility]   Culture     Final   Value:        BLOOD CULTURE RECEIVED NO GROWTH TO DATE CULTURE WILL BE HELD FOR 5 DAYS BEFORE ISSUING A FINAL NEGATIVE REPORT     Performed at [facility]   Report Status PENDING   Incomplete    Radiology Reports Ct Abdomen Pelvis W Contrast  06/20/2013   CLINICAL DATA:  Abdominal pain.  EXAM: CT ABDOMEN AND PELVIS WITH CONTRAST  TECHNIQUE: Multidetector CT imaging of the abdomen and pelvis was performed using the standard protocol following bolus administration of intravenous contrast.  CONTRAST:  OMNIPAQUE IOHEXOL 300 MG/ML  SOLN  COMPARISON:  02/23/2009.  FINDINGS: BODY WALL: Unremarkable.  LOWER CHEST:  Mediastinum: Coronary artery atherosclerosis and aortic annular calcification. Mild cardiomegaly.  Lungs/pleura: Calcified right hilar lymph nodes.  ABDOMEN/PELVIS:  Liver: Coarse calcification noted. Circumscribed sub cm low-attenuation areas around the gallbladder fossa were likely present previously, compatible with benign entity such is cysts.  Biliary: Cholelithiasis. There is  gallbladder distention and gallbladder wall thickening with probable mild pericholecystic fat haziness. No biliary ductal enlargement.  Pancreas: Unremarkable.  Spleen: Numerous coarse calcifications consistent with previous granulomatous infection.  Adrenals: Unchanged 18 mm nodule in the medial limb right adrenal gland, consistent with adenoma.  Kidneys and ureters: Numerous low dense renal masses, size similar to priors when accounting for long interval. A 60 Hounsfield unit, 12 mm diameter nodule from the low interpolar left kidney is unchanged in size and density, most likely hemorrhagic cyst. There is a notably large cyst from the lower pole right kidney, 11 cm in diameter. No hydronephrosis.  Bladder: Unremarkable.  Bowel: No obstruction.   Extensive colonic diverticulosis.  Retroperitoneum: No mass or adenopathy.  Peritoneum: No free fluid or gas.  Reproductive: Prostate brachytherapy seeds.  Vascular: No acute abnormality.  OSSEOUS: No acute abnormalities. No suspicious lytic or blastic lesions.  IMPRESSION: 1. In the appropriate clinical setting, findings consistent with acute cholecystitis (gallbladder distention, cholelithiasis, and gallbladder wall thickening). 2. Bilateral renal cystic disease, appearance stable from 2010. 3. Colonic diverticulosis. 4. 18 mm right adrenal adenoma.   Electronically Signed   By: Tiburcio Pea   On: 06/20/2013 05:25    CBC  Recent Labs Lab 06/20/13 0250 06/21/13 0830 06/22/13 0635  WBC 8.8 9.2 8.9  HGB 13.2 11.3* 10.7*  HCT 37.5* 33.8* 30.8*  PLT 243 234 233  MCV 87.6 88.9 87.7  MCH 30.8 29.7 30.5  MCHC 35.2 33.4 34.7  RDW 14.9 15.5 15.3  LYMPHSABS 0.9  --   --  MONOABS 0.5  --   --   EOSABS 0.1  --   --   BASOSABS 0.0  --   --     Chemistries   Recent Labs Lab 06/20/13 0250 06/21/13 0830 06/22/13 0635  NA 138 130* 131*  K 3.7 3.4* 3.3*  CL 100 94* 96  CO2 24 25 23   GLUCOSE 145* 136* 114*  BUN 19 30* 28*  CREATININE 1.06 1.82* 1.48*   CALCIUM 9.2 8.5 8.2*  AST 19 19 22   ALT 15 13 14   ALKPHOS 108 95 93  BILITOT 2.2* 2.5* 2.3*   ------------------------------------------------------------------------------------------------------------------ estimated creatinine clearance is 46.5 ml/min (by C-G formula based on Cr of 1.48). ------------------------------------------------------------------------------------------------------------------ No results found for this basename: HGBA1C,  in the last 72 hours ------------------------------------------------------------------------------------------------------------------ No results found for this basename: CHOL, HDL, LDLCALC, TRIG, CHOLHDL, LDLDIRECT,  in the last 72 hours ------------------------------------------------------------------------------------------------------------------ No results found for this basename: TSH, T4TOTAL, FREET3, T3FREE, THYROIDAB,  in the last 72 hours ------------------------------------------------------------------------------------------------------------------ No results found for this basename: VITAMINB12, FOLATE, FERRITIN, TIBC, IRON, RETICCTPCT,  in the last 72 hours  Coagulation profile  Recent Labs Lab 06/20/13 1421  INR 1.10    No results found for this basename: DDIMER,  in the last 72 hours  Cardiac Enzymes No results found for this basename: CK, CKMB, TROPONINI, MYOGLOBIN,  in the last 168 hours ------------------------------------------------------------------------------------------------------------------ No components found with this basename: POCBNP,

## 2013-06-22 NOTE — Progress Notes (Signed)
Cr better. RUQ pain. Low grade fever.   Alert, nad  Soft, RUQ TTP  O/w see PA note  To OR for Lap chole, poss open  All questions asked and answered.  Cleared by cards  Mary Sella. [name], MD, FACS General, Bariatric, & Minimally Invasive Surgery [facility], [place]

## 2013-06-22 NOTE — Progress Notes (Signed)
Patient Name: [name] [name] Date of Encounter: 06/22/2013  Principal Problem:   Acute cholecystitis Active Problems:   CAD (coronary artery disease)   HTN (hypertension)   Hypercholesteremia   Chronic renal insufficiency   Left ventricular dysfunction   Gout   History of TIAs   Prostate cancer   Carotid arterial disease   Chronic systolic CHF (congestive heart failure)   Acute renal failure    SUBJECTIVE: No chest pain, mild cough with congestion, no fever, no sputum. No SOB.  OBJECTIVE Filed Vitals:   06/21/13 2100 06/22/13 0527 06/22/13 0534 06/22/13 0948  BP: 135/53 137/53  133/46  Pulse: 76 77    Temp: 99.6 F (37.6 C) 102.8 F (39.3 C) 100.4 F (38 C)   TempSrc: Oral Oral Oral   Resp: 20 18    Height:      Weight:  182 lb 15.7 oz (83 kg)    SpO2: 92% 93%      Intake/Output Summary (Last 24 hours) at 06/22/13 0950 Last data filed at 06/22/13 0500  Gross per 24 hour  Intake   1200 ml  Output    700 ml  Net    500 ml   Filed Weights   06/20/13 1030 06/21/13 0500 06/22/13 0527  Weight: 180 lb (81.647 kg) 181 lb 7 oz (82.3 kg) 182 lb 15.7 oz (83 kg)    PHYSICAL EXAM General: Well developed, well nourished, male in no acute distress. Head: Normocephalic, atraumatic.  Neck: Supple without bruits, JVD at 8 cm. Lungs:  Resp regular and unlabored, rales bilateral bases, R>L. Heart: RRR, S1, S2, no S3, S4, or murmur; no rub. Abdomen: Soft, tender, non-distended, BS + x 4.  Extremities: No clubbing, cyanosis, no edema.  Neuro: Alert and oriented X 3. Moves all extremities spontaneously. Psych: Normal affect.  LABS: CBC: Recent Labs  06/20/13 0250 06/21/13 0830 06/22/13 0635  WBC 8.8 9.2 8.9  NEUTROABS 7.3  --   --   HGB 13.2 11.3* 10.7*  HCT 37.5* 33.8* 30.8*  MCV 87.6 88.9 87.7  PLT 243 234 233   INR: Recent Labs  06/20/13 1421  INR 1.10   Basic Metabolic Panel: Recent Labs  06/21/13 0830 06/22/13 0635  NA 130* 131*  K 3.4* 3.3*   CL 94* 96  CO2 25 23  GLUCOSE 136* 114*  BUN 30* 28*  CREATININE 1.82* 1.48*  CALCIUM 8.5 8.2*   Liver Function Tests: Recent Labs  06/21/13 0830 06/22/13 0635  AST 19 22  ALT 13 14  ALKPHOS 95 93  BILITOT 2.5* 2.3*  PROT 6.0 5.7*  ALBUMIN 2.7* 2.4*   Recent Labs  06/20/13 0254  TROPIPOC 0.02   TELE:   SR, PVCs    ECHO: 06/21/2013  Study Conclusions Left ventricle: The cavity size was mildly dilated. Systolic function was mildly to moderately reduced. The estimated ejection fraction was in the range of 40% to 45%. Akinesis of the midanteroseptal myocardium. Akinesis of the basalinferior myocardium. There was an increased relative contribution of atrial contraction to ventricular filling.   Radiology/Studies: Ct Abdomen Pelvis W Contrast 06/20/2013   CLINICAL DATA:  Abdominal pain.  EXAM: CT ABDOMEN AND PELVIS WITH CONTRAST  TECHNIQUE: Multidetector CT imaging of the abdomen and pelvis was performed using the standard protocol following bolus administration of intravenous contrast.  CONTRAST:  OMNIPAQUE IOHEXOL 300 MG/ML  SOLN  COMPARISON:  02/23/2009.  FINDINGS: BODY WALL: Unremarkable.  LOWER CHEST:  Mediastinum: Coronary artery atherosclerosis  and aortic annular calcification. Mild cardiomegaly.  Lungs/pleura: Calcified right hilar lymph nodes.  ABDOMEN/PELVIS:  Liver: Coarse calcification noted. Circumscribed sub cm low-attenuation areas around the gallbladder fossa were likely present previously, compatible with benign entity such is cysts.  Biliary: Cholelithiasis. There is gallbladder distention and gallbladder wall thickening with probable mild pericholecystic fat haziness. No biliary ductal enlargement.  Pancreas: Unremarkable.  Spleen: Numerous coarse calcifications consistent with previous granulomatous infection.  Adrenals: Unchanged 18 mm nodule in the medial limb right adrenal gland, consistent with adenoma.  Kidneys and ureters: Numerous low dense renal  masses, size similar to priors when accounting for long interval. A 60 Hounsfield unit, 12 mm diameter nodule from the low interpolar left kidney is unchanged in size and density, most likely hemorrhagic cyst. There is a notably large cyst from the lower pole right kidney, 11 cm in diameter. No hydronephrosis.  Bladder: Unremarkable.  Bowel: No obstruction.   Extensive colonic diverticulosis.  Retroperitoneum: No mass or adenopathy.  Peritoneum: No free fluid or gas.  Reproductive: Prostate brachytherapy seeds.  Vascular: No acute abnormality.  OSSEOUS: No acute abnormalities. No suspicious lytic or blastic lesions.  IMPRESSION: 1. In the appropriate clinical setting, findings consistent with acute cholecystitis (gallbladder distention, cholelithiasis, and gallbladder wall thickening). 2. Bilateral renal cystic disease, appearance stable from 2010. 3. Colonic diverticulosis. 4. 18 mm right adrenal adenoma.   Electronically Signed   By: Tiburcio Pea   On: 06/20/2013 05:25     Current Medications:  . allopurinol  300 mg Oral Daily  . amLODipine  10 mg Oral Daily  . aspirin EC  81 mg Oral Daily  . atorvastatin  80 mg Oral q1800  . benazepril  40 mg Oral Daily  . folic acid  1 mg Oral Daily  . hydrALAZINE  25 mg Oral TID  . isosorbide mononitrate  30 mg Oral BID  . [START ON 06/25/2013] methotrexate  10 mg Oral Q Mon  . metoprolol tartrate  12.5 mg Oral BID  . piperacillin-tazobactam (ZOSYN)  IV  3.375 g Intravenous Q8H  . sodium chloride  3 mL Intravenous Q12H  . [START ON 06/27/2013] Vitamin D (Ergocalciferol)  50,000 Units Oral Q14 Days   . sodium chloride 75 mL/hr at 06/21/13 1407    ASSESSMENT AND PLAN: Hx CAD - no ischemic symptoms and EF is improved from 2009 MV. No further cardiac workup indicated, he is at acceptable risk for surgery. Lopressor 2.5 mg IV q 6 hr while NPO.  Hypokalemia - will supplement IV x 1 run as he is NPO for surgery; otherwise per primary MD  Congestion - will  ck stat portable CXR, need to make sure no extra volume on board but this should not delay surgery. Will need to track volume carefully pre- and post-op.   Otherwise, per primary MD and surgery. Principal Problem:   Acute cholecystitis Active Problems:   CAD (coronary artery disease)   HTN (hypertension)   Hypercholesteremia   Chronic renal insufficiency   Left ventricular dysfunction   Gout   History of TIAs   Prostate cancer   Carotid arterial disease   Chronic systolic CHF (congestive heart failure)   Acute renal failure   Signed, Theodore Demark , PA-C 9:50 AM 06/22/2013 Patient seen and examined and history reviewed. Agree with above findings and plan. EF improved by Echo. I reviewed portable CXR- the right hemidiaphragm is mildly elevated. Otherwise no acute pathology. He is cleared to proceed with gallbladder surgery today.  Theron Arista [facility] 06/22/2013 10:29 AM

## 2013-06-22 NOTE — Transfer of Care (Signed)
Immediate Anesthesia Transfer of Care Note  Patient: [name] [name]  Procedure(s) Performed: Procedure(s): LAPAROSCOPIC CHOLECYSTECTOMY (N/A)  Patient Location: PACU  Anesthesia Type:General  Level of Consciousness: awake, alert  and oriented  Airway & Oxygen Therapy: Patient Spontanous Breathing and Patient connected to nasal cannula oxygen  Post-op Assessment: Report given to PACU RN and Post -op Vital signs reviewed and stable  Post vital signs: Reviewed and stable  Complications: No apparent anesthesia complications

## 2013-06-22 NOTE — Anesthesia Preprocedure Evaluation (Addendum)
Anesthesia Evaluation    Reviewed: Allergy & Precautions, H&P , NPO status , Patient's Chart, lab work & pertinent test results, reviewed documented beta blocker date and time   History of Anesthesia Complications Negative for: history of anesthetic complications  Airway       Dental   Pulmonary neg pulmonary ROS,          Cardiovascular hypertension, Pt. on medications + CAD, + Peripheral Vascular Disease and +CHF  06/21/2013  Study Conclusions Left ventricle: The cavity size was mildly dilated. Systolic function was mildly to moderately reduced. The estimated ejection fraction was in the range of 40% to 45%. Akinesis of the midanteroseptal myocardium. Akinesis of the basalinferior myocardium. There was an increased relative contribution of atrial contraction to ventricular filling.    Neuro/Psych negative neurological ROS     GI/Hepatic Neg liver ROS,   Endo/Other  negative endocrine ROS  Renal/GU Renal InsufficiencyRenal disease     Musculoskeletal   Abdominal   Peds  Hematology   Anesthesia Other Findings   Reproductive/Obstetrics                          Anesthesia Physical Anesthesia Plan  ASA: III  Anesthesia Plan: General   Post-op Pain Management:    Induction: Intravenous  Airway Management Planned: Oral ETT  Additional Equipment:   Intra-op Plan:   Post-operative Plan: Extubation in OR  Informed Consent:   Plan Discussed with: CRNA, Anesthesiologist and Surgeon  Anesthesia Plan Comments:         Anesthesia Quick Evaluation

## 2013-06-22 NOTE — Op Note (Signed)
Laparoscopic Cholecystectomy  Procedure Note  Indications: This patient presents with symptomatic gallbladder disease and will undergo laparoscopic cholecystectomy.  Pre-operative Diagnosis: Calculus of gallbladder with acute cholecystitis, without mention of obstruction  Post-operative Diagnosis: Gangrenous calculous acute cholecystitis  Surgeon: Atilano Ina   Assistants: Dr Violeta Gelinas  Anesthesia: General endotracheal anesthesia  ASA Class: 3  Procedure Details  The patient was seen again in the Holding Room. The risks, benefits, complications, treatment options, and expected outcomes were discussed with the patient. The possibilities of reaction to medication, pulmonary aspiration, perforation of viscus, bleeding, recurrent infection, finding a normal gallbladder, the need for additional procedures, failure to diagnose a condition, the possible need to convert to an open procedure, and creating a complication requiring transfusion or operation were discussed with the patient. The likelihood of improving the patient's symptoms with return to their baseline status is good.  The patient and/or family concurred with the proposed plan, giving informed consent. The site of surgery properly noted. The patient was taken to Operating Room, identified as [name] [name] and the procedure verified as Laparoscopic Cholecystectomy with Intraoperative Cholangiogram. A Time Out was held and the above information confirmed.  Prior to the induction of general anesthesia, it was confirmed that the pt was on therapeutic antibiotics. General endotracheal anesthesia was then administered and tolerated well. After the induction, the abdomen was prepped with Chloraprep and draped in the sterile fashion. The patient was positioned in the supine position.  Local anesthetic agent was injected into the skin near the umbilicus and an incision made. We dissected down to the abdominal fascia with blunt  dissection.  The fascia was incised vertically and we entered the peritoneal cavity bluntly.  A pursestring suture of 0-Vicryl was placed around the fascial opening.  The Hasson cannula was inserted and secured with the stay suture.  Pneumoperitoneum was then created with CO2 and tolerated well without any adverse changes in the patient's vital signs. An 5-mm port was placed in the subxiphoid position.  Two 5-mm ports were placed in the right upper quadrant. All skin incisions were infiltrated with a local anesthetic agent before making the incision and placing the trocars.   We positioned the patient in reverse Trendelenburg, tilted slightly to the patient's left. The patient's gallbladder appeared to be encased in omentum. We were able to peel the thickened omentum off of the gallbladder. The gallbladder was very distended & thickwalled. We ended up aspirating the gallbladder in order to facilitate retraction. The gallbladder was identified, the fundus grasped and retracted cephalad. Adhesions were lysed bluntly and with the electrocautery where indicated, taking care not to injure any adjacent organs or viscus. The infundibulum was grasped and retracted laterally, exposing the peritoneum overlying the triangle of Calot. This was then divided and exposed in a blunt fashion. I identified the cystic duct and cystic artery. The cystic artery was ligated 2 clips proximally and one clip as it entered the gallbladder.  A critical view of the cystic duct and cystic artery had been obtained.  The cystic duct was clearly identified and bluntly dissected circumferentially.the cystic duct was shortened and thickened therefore I decided not to do a cholangiogram The cystic duct was then ligated with clips and divided.   The gallbladder was dissected from the liver bed in retrograde fashion with the electrocautery. A posterior branch of the cystic artery was identified and clipped and divided distally. The posterior wall  of the gallbladder was used with the liver. Near the dome  of the gallbladder, the gallbladder was entered with some spillage of gallstones. The stones were quickly grabbed and removed. I up sized the subxiphoid trocar to an 11 mm trocar in case any additional stones fell out. The gallbladder was removed and placed in an Endocatch sac.  The liver bed was irrigated and inspected. There is diffuse oozing from the gallbladder fossa. Electrocautery was increased and the gallbladder fossa was cauterized twice.  Hemostasis was achieved with the electrocautery. Copious irrigation was utilized and was repeatedly aspirated until clear. I placed 2 pieces of surgical snow in the gallbladder. The gallbladder and Endocatch sac were then removed through the subxiphoid port site.  I had to enlarge the fascial defect in order to extract the gallbladder. That still wasn't enough so I had to open Endo Catch bag and removed the gallbladder in pieces. 2 interrupted 0 Vicryl sutures were placed through the fascia in the subxiphoid position to close the fascial defect. The pursestring suture was used to close the umbilical fascia.    We again inspected the right upper quadrant for hemostasis.  The umbilical closure was inspected and there was no air leak and nothing trapped within the closure. Pneumoperitoneum was released as we removed the trocars.  4-0 Monocryl was used to close the skin.   Benzoin, steri-strips, and clean dressings were applied. The patient was then extubated and brought to the recovery room in stable condition. Instrument, sponge, and needle counts were correct at closure and at the conclusion of the case.   Findings: Acute gangrenous Cholecystitis with Cholelithiasis  Estimated Blood Loss: less than 100 mL         Drains: none         Specimens: Gallbladder           Complications: None; patient tolerated the procedure well.         Disposition: PACU - hemodynamically stable.         Condition:  stable  [name] [name]. [name], MD, FACS General, Bariatric, & Minimally Invasive Surgery [facility], [place]

## 2013-06-23 LAB — BASIC METABOLIC PANEL
BUN: 22 mg/dL (ref 6–23)
CO2: 22 mEq/L (ref 19–32)
Chloride: 97 mEq/L (ref 96–112)
Glucose, Bld: 123 mg/dL — ABNORMAL HIGH (ref 70–99)
Potassium: 3.7 mEq/L (ref 3.5–5.1)
Sodium: 132 mEq/L — ABNORMAL LOW (ref 135–145)

## 2013-06-23 LAB — CBC
HCT: 34.3 % — ABNORMAL LOW (ref 39.0–52.0)
Hemoglobin: 11.5 g/dL — ABNORMAL LOW (ref 13.0–17.0)
RBC: 3.88 MIL/uL — ABNORMAL LOW (ref 4.22–5.81)
WBC: 11.6 10*3/uL — ABNORMAL HIGH (ref 4.0–10.5)

## 2013-06-23 MED ORDER — DOCUSATE SODIUM 100 MG PO CAPS
100.0000 mg | ORAL_CAPSULE | Freq: Two times a day (BID) | ORAL | Status: DC
  Administered 2013-06-23 – 2013-06-25 (×5): 100 mg via ORAL
  Filled 2013-06-23 (×6): qty 1

## 2013-06-23 MED ORDER — GLYCERIN (LAXATIVE) 2.1 G RE SUPP
1.0000 | Freq: Every day | RECTAL | Status: DC | PRN
  Filled 2013-06-23: qty 1

## 2013-06-23 NOTE — Progress Notes (Signed)
[facility]                                                                                Patient Demographics  [name] [name], is a 79 y.o. male, DOB - 13-Nov-1931, XBJ:[id number]  Admit date - 06/20/2013   Admitting Physician Leroy Sea, MD  Outpatient Primary MD for the patient is Juline Patch, MD  LOS - 3   Chief Complaint  Patient presents with  . Abdominal Pain        Assessment & Plan  1. Acute cholecystitis  POD 1, laproscopic cholecystectomy.  Bandages are clean.  Bowel sounds noted.  Surgery following.  Continue zosyn.      2. Acute on chronic renal failure, improved.    Creatinine 1.06. Continue on IV fluids 69ml/hr.    3. CAD (coronary artery disease)  Patient is chest free at this time.  Cardiology following.    Continue current meds.  4. HTN (hypertension)  Continue Norvasc Imdur and beta blocker hydralazine.  5. Hypercholesteremia  Continue statin.  6.  Chronic systolic CHF (congestive heart failure)/Left ventricular dysfunction  Currently euvolemic.  Will continue monitor closely.     7. Gout, stable   8.  History of TIAs, stable   Code Status: Full  Family Communication: None  Disposition Plan: Admitted.     Procedures Echocardiogram (as listed above) Study Conclusions Left ventricle: The cavity size was mildly dilated. Systolic function was mildly to moderately reduced. The estimated ejection fraction was in the range of 40% to 45%. Akinesis of the midanteroseptal myocardium. Akinesis of the basalinferior myocardium. There was an increased relative contribution of atrial contraction to ventricular filling.  Consults  Cardiology, Surgery  DVT Prophylaxis  SCDs   Lab Results  Component Value Date   PLT 323 06/23/2013    Medications  Scheduled Meds: . allopurinol  300 mg Oral Daily  . amLODipine  10 mg Oral Daily  . aspirin EC  81 mg Oral Daily  . atorvastatin  80 mg Oral q1800  . benazepril  40 mg Oral Daily   . folic acid  1 mg Oral Daily  . hydrALAZINE  25 mg Oral TID  . isosorbide mononitrate  30 mg Oral BID  . [START ON 06/25/2013] methotrexate  10 mg Oral Q Mon  . metoprolol tartrate  12.5 mg Oral BID  . piperacillin-tazobactam (ZOSYN)  IV  3.375 g Intravenous Q8H  . sodium chloride  3 mL Intravenous Q12H  . [START ON 06/27/2013] Vitamin D (Ergocalciferol)  50,000 Units Oral Q14 Days   Continuous Infusions: . sodium chloride 75 mL/hr at 06/21/13 1407  . lactated ringers 50 mL/hr at 06/22/13 1134   PRN Meds:.acetaminophen, albuterol, guaiFENesin-dextromethorphan, HYDROcodone-acetaminophen, morphine injection, nitroGLYCERIN, ondansetron (ZOFRAN) IV, ondansetron  Antibiotics    Anti-infectives   Start     Dose/Rate Route Frequency Ordered Stop   06/22/13 2200  piperacillin-tazobactam (ZOSYN) IVPB 3.375 g     3.375 g 12.5 mL/hr over 240 Minutes Intravenous 3 times per day 06/22/13 1434 06/25/13 2159   06/20/13 2015  [MAR Hold]  piperacillin-tazobactam (ZOSYN) IVPB 3.375 g  Status:  Discontinued     (On MAR Hold since  06/22/13 1058)   3.375 g 12.5 mL/hr over 240 Minutes Intravenous 3 times per day 06/20/13 1914 06/22/13 1434   06/20/13 0900  Ampicillin-Sulbactam (UNASYN) 3 g in sodium chloride 0.9 % 100 mL IVPB  Status:  Discontinued     3 g 100 mL/hr over 60 Minutes Intravenous Every 8 hours 06/20/13 0856 06/20/13 0856   06/20/13 0900  Ampicillin-Sulbactam (UNASYN) 3 g in sodium chloride 0.9 % 100 mL IVPB  Status:  Discontinued     3 g 100 mL/hr over 60 Minutes Intravenous Every 6 hours 06/20/13 0856 06/20/13 1844       Time Spent in minutes   20 minutes   Sion Thane D.O. on 06/23/2013 at 8:00 AM  Between 7am to 7pm - Pager - [phone]  After 7pm go to [email or web address] - password [email or web address]  And look for the night coverage person covering for me after hours  [facility] Office  [phone]    Subjective:   [name] [name] seen and evaluated today.  He has  no complaints this morning.  He denies dizziness, headache, chest pain, SOB, N/V/D/C. Does complain of some abdominal soreness.  He is tolerating a liquid diet.   Objective:   Filed Vitals:   06/22/13 2104 06/23/13 0004 06/23/13 0129 06/23/13 0642  BP: 156/65  150/56 171/58  Pulse: 90  77 79  Temp: 101.5 F (38.6 C) 98.9 F (37.2 C) 99.8 F (37.7 C) 99.5 F (37.5 C)  TempSrc: Oral  Oral Oral  Resp: 20  18 18   Height:      Weight:    85.322 kg (188 lb 1.6 oz)  SpO2: 92%  95% 94%    Wt Readings from Last 3 Encounters:  06/23/13 85.322 kg (188 lb 1.6 oz)  06/23/13 85.322 kg (188 lb 1.6 oz)  03/28/13 85.639 kg (188 lb 12.8 oz)     Intake/Output Summary (Last 24 hours) at 06/23/13 0800 Last data filed at 06/23/13 0603  Gross per 24 hour  Intake 3541.25 ml  Output    150 ml  Net 3391.25 ml    Exam General: Well developed, NAD HEENT: [place]/AT,PERRAL  Neck: Supple Neck,No JVD, No cervical lymphadenopathy appriciated.  Respiratory: Symmetrical Chest wall movement, Good air movement bilaterally, CTAB  Cardiovascular: RRR. No Gallops,Rubs or new Murmurs  Abdomen: Soft, tender to palpation of the right upper quadrant, nondistended, positive bowel sounds, no organomegaly palpated.  Clean bandages noted.  Extremities: No Cyanosis, Clubbing, edema Neuro: Awake Alert, Oriented X 3, Normal affect, Grossly intact, nonfocal.    Data Review   Micro Results Recent Results (from the past 240 hour(s))  CULTURE, BLOOD (ROUTINE X 2)     Status: None   Collection Time    06/20/13  7:41 PM      Result Value Range Status   Specimen Description BLOOD ARM LEFT   Final   Special Requests BOTTLES DRAWN AEROBIC AND ANAEROBIC 10CC   Final   Culture  Setup Time     Final   Value: 06/21/2013 00:34     Performed at [facility]   Culture     Final   Value:        BLOOD CULTURE RECEIVED NO GROWTH TO DATE CULTURE WILL BE HELD FOR 5 DAYS BEFORE ISSUING A FINAL NEGATIVE REPORT     Performed  at [facility]   Report Status PENDING   Incomplete  CULTURE, BLOOD (ROUTINE X 2)     Status: None  Collection Time    06/20/13  7:49 PM      Result Value Range Status   Specimen Description BLOOD HAND LEFT   Final   Special Requests BOTTLES DRAWN AEROBIC AND ANAEROBIC 10CC   Final   Culture  Setup Time     Final   Value: 06/21/2013 00:35     Performed at [facility]   Culture     Final   Value:        BLOOD CULTURE RECEIVED NO GROWTH TO DATE CULTURE WILL BE HELD FOR 5 DAYS BEFORE ISSUING A FINAL NEGATIVE REPORT     Performed at [facility]   Report Status PENDING   Incomplete    Radiology Reports Ct Abdomen Pelvis W Contrast  06/20/2013   CLINICAL DATA:  Abdominal pain.  EXAM: CT ABDOMEN AND PELVIS WITH CONTRAST  TECHNIQUE: Multidetector CT imaging of the abdomen and pelvis was performed using the standard protocol following bolus administration of intravenous contrast.  CONTRAST:  OMNIPAQUE IOHEXOL 300 MG/ML  SOLN  COMPARISON:  02/23/2009.  FINDINGS: BODY WALL: Unremarkable.  LOWER CHEST:  Mediastinum: Coronary artery atherosclerosis and aortic annular calcification. Mild cardiomegaly.  Lungs/pleura: Calcified right hilar lymph nodes.  ABDOMEN/PELVIS:  Liver: Coarse calcification noted. Circumscribed sub cm low-attenuation areas around the gallbladder fossa were likely present previously, compatible with benign entity such is cysts.  Biliary: Cholelithiasis. There is gallbladder distention and gallbladder wall thickening with probable mild pericholecystic fat haziness. No biliary ductal enlargement.  Pancreas: Unremarkable.  Spleen: Numerous coarse calcifications consistent with previous granulomatous infection.  Adrenals: Unchanged 18 mm nodule in the medial limb right adrenal gland, consistent with adenoma.  Kidneys and ureters: Numerous low dense renal masses, size similar to priors when accounting for long interval. A 60 Hounsfield unit, 12 mm diameter  nodule from the low interpolar left kidney is unchanged in size and density, most likely hemorrhagic cyst. There is a notably large cyst from the lower pole right kidney, 11 cm in diameter. No hydronephrosis.  Bladder: Unremarkable.  Bowel: No obstruction.   Extensive colonic diverticulosis.  Retroperitoneum: No mass or adenopathy.  Peritoneum: No free fluid or gas.  Reproductive: Prostate brachytherapy seeds.  Vascular: No acute abnormality.  OSSEOUS: No acute abnormalities. No suspicious lytic or blastic lesions.  IMPRESSION: 1. In the appropriate clinical setting, findings consistent with acute cholecystitis (gallbladder distention, cholelithiasis, and gallbladder wall thickening). 2. Bilateral renal cystic disease, appearance stable from 2010. 3. Colonic diverticulosis. 4. 18 mm right adrenal adenoma.   Electronically Signed   By: Tiburcio Pea   On: 06/20/2013 05:25    CBC  Recent Labs Lab 06/20/13 0250 06/21/13 0830 06/22/13 0635 06/23/13 0615  WBC 8.8 9.2 8.9 11.6*  HGB 13.2 11.3* 10.7* 11.5*  HCT 37.5* 33.8* 30.8* 34.3*  PLT 243 234 233 323  MCV 87.6 88.9 87.7 88.4  MCH 30.8 29.7 30.5 29.6  MCHC 35.2 33.4 34.7 33.5  RDW 14.9 15.5 15.3 15.3  LYMPHSABS 0.9  --   --   --   MONOABS 0.5  --   --   --   EOSABS 0.1  --   --   --   BASOSABS 0.0  --   --   --     Chemistries   Recent Labs Lab 06/20/13 0250 06/21/13 0830 06/22/13 0635  NA 138 130* 131*  K 3.7 3.4* 3.3*  CL 100 94* 96  CO2 24 25 23   GLUCOSE 145* 136* 114*  BUN 19 30* 28*  CREATININE 1.06 1.82* 1.48*  CALCIUM 9.2 8.5 8.2*  AST 19 19 22   ALT 15 13 14   ALKPHOS 108 95 93  BILITOT 2.2* 2.5* 2.3*   ------------------------------------------------------------------------------------------------------------------ estimated creatinine clearance is 46.5 ml/min (by C-G formula based on Cr of 1.48). ------------------------------------------------------------------------------------------------------------------ No  results found for this basename: HGBA1C,  in the last 72 hours ------------------------------------------------------------------------------------------------------------------ No results found for this basename: CHOL, HDL, LDLCALC, TRIG, CHOLHDL, LDLDIRECT,  in the last 72 hours ------------------------------------------------------------------------------------------------------------------ No results found for this basename: TSH, T4TOTAL, FREET3, T3FREE, THYROIDAB,  in the last 72 hours ------------------------------------------------------------------------------------------------------------------ No results found for this basename: VITAMINB12, FOLATE, FERRITIN, TIBC, IRON, RETICCTPCT,  in the last 72 hours  Coagulation profile  Recent Labs Lab 06/20/13 1421  INR 1.10    No results found for this basename: DDIMER,  in the last 72 hours  Cardiac Enzymes No results found for this basename: CK, CKMB, TROPONINI, MYOGLOBIN,  in the last 168 hours ------------------------------------------------------------------------------------------------------------------ No components found with this basename: POCBNP,

## 2013-06-23 NOTE — Progress Notes (Signed)
1 Day Post-Op  Subjective: Complains of bloating. Hasn't gotten out of bed. Hasn't taken much po  Objective: Vital signs in last 24 hours: Temp:  [98.2 F (36.8 C)-101.5 F (38.6 C)] 99.5 F (37.5 C) (09/27 1610) Pulse Rate:  [70-90] 79 (09/27 0642) Resp:  [18-26] 18 (09/27 0642) BP: (140-171)/(52-65) 171/58 mmHg (09/27 0642) SpO2:  [90 %-95 %] 94 % (09/27 0642) Weight:  [188 lb 1.6 oz (85.322 kg)] 188 lb 1.6 oz (85.322 kg) (09/27 0642) Last BM Date: 06/18/13  Intake/Output from previous day: 09/26 0701 - 09/27 0700 In: 4366.3 [P.O.:300; I.V.:3966.3; IV Piggyback:100] Out: 150 [Urine:150] Intake/Output this shift:    GI: soft, mild tenderness RUQ. few bs. distended  Lab Results:   Recent Labs  06/22/13 0635 06/23/13 0615  WBC 8.9 11.6*  HGB 10.7* 11.5*  HCT 30.8* 34.3*  PLT 233 323   BMET  Recent Labs  06/22/13 0635 06/23/13 0615  NA 131* 132*  K 3.3* 3.7  CL 96 97  CO2 23 22  GLUCOSE 114* 123*  BUN 28* 22  CREATININE 1.48* 1.06  CALCIUM 8.2* 8.6   PT/INR  Recent Labs  06/20/13 1421  LABPROT 14.0  INR 1.10   ABG No results found for this basename: PHART, PCO2, PO2, HCO3,  in the last 72 hours  Studies/Results: Dg Chest Port 1 View  06/22/2013   CLINICAL DATA:  Chest congestion  EXAM: PORTABLE CHEST - 1 VIEW  COMPARISON:  04/09/2009  FINDINGS: Low lung volumes are present, causing crowding of the pulmonary vasculature. Linear opacities at both lung bases favor subsegmental atelectasis.  Old granulomatous disease noted. Prior CABG. Mild cardiomegaly.  Atherosclerotic aortic arch noted.  IMPRESSION: 1. Low lung volumes with bibasilar subsegmental atelectasis. 2. Atherosclerosis. 3. Mild cardiomegaly.   Electronically Signed   By: Herbie Baltimore   On: 06/22/2013 10:36    Anti-infectives: Anti-infectives   Start     Dose/Rate Route Frequency Ordered Stop   06/22/13 2200  piperacillin-tazobactam (ZOSYN) IVPB 3.375 g     3.375 g 12.5 mL/hr over 240  Minutes Intravenous 3 times per day 06/22/13 1434 06/25/13 2159   06/20/13 2015  [MAR Hold]  piperacillin-tazobactam (ZOSYN) IVPB 3.375 g  Status:  Discontinued     (On MAR Hold since 06/22/13 1058)   3.375 g 12.5 mL/hr over 240 Minutes Intravenous 3 times per day 06/20/13 1914 06/22/13 1434   06/20/13 0900  Ampicillin-Sulbactam (UNASYN) 3 g in sodium chloride 0.9 % 100 mL IVPB  Status:  Discontinued     3 g 100 mL/hr over 60 Minutes Intravenous Every 8 hours 06/20/13 0856 06/20/13 0856   06/20/13 0900  Ampicillin-Sulbactam (UNASYN) 3 g in sodium chloride 0.9 % 100 mL IVPB  Status:  Discontinued     3 g 100 mL/hr over 60 Minutes Intravenous Every 6 hours 06/20/13 0856 06/20/13 1844      Assessment/Plan: [name]/p Procedure([name]): LAPAROSCOPIC CHOLECYSTECTOMY (N/A) Advance diet Needs to ambulate with assistance Will give laxative Probably needs another day  LOS: 3 days    [name] [name],[name] [name] 06/23/2013

## 2013-06-23 NOTE — Progress Notes (Signed)
   SUBJECTIVE:  Not much abdominal pain.  No chest pain and no SOB.    PHYSICAL EXAM Filed Vitals:   06/22/13 2104 06/23/13 0004 06/23/13 0129 06/23/13 0642  BP: 156/65  150/56 171/58  Pulse: 90  77 79  Temp: 101.5 F (38.6 C) 98.9 F (37.2 C) 99.8 F (37.7 C) 99.5 F (37.5 C)  TempSrc: Oral  Oral Oral  Resp: 20  18 18   Height:      Weight:    188 lb 1.6 oz (85.322 kg)  SpO2: 92%  95% 94%   General:  No distress Lungs:  Clear Heart:  RRR Abdomen:  Distended Extremities:  No edema  LABS:  Results for orders placed during the hospital encounter of 06/20/13 (from the past 24 hour(s))  CBC     Status: Abnormal   Collection Time    06/23/13  6:15 AM      Result Value Range   WBC 11.6 (*) 4.0 - 10.5 K/uL   RBC 3.88 (*) 4.22 - 5.81 MIL/uL   Hemoglobin 11.5 (*) 13.0 - 17.0 g/dL   HCT 16.1 (*) 09.6 - 04.5 %   MCV 88.4  78.0 - 100.0 fL   MCH 29.6  26.0 - 34.0 pg   MCHC 33.5  30.0 - 36.0 g/dL   RDW 40.9  81.1 - 91.4 %   Platelets 323  150 - 400 K/uL  BASIC METABOLIC PANEL     Status: Abnormal   Collection Time    06/23/13  6:15 AM      Result Value Range   Sodium 132 (*) 135 - 145 mEq/L   Potassium 3.7  3.5 - 5.1 mEq/L   Chloride 97  96 - 112 mEq/L   CO2 22  19 - 32 mEq/L   Glucose, Bld 123 (*) 70 - 99 mg/dL   BUN 22  6 - 23 mg/dL   Creatinine, Ser 7.82  0.50 - 1.35 mg/dL   Calcium 8.6  8.4 - 95.6 mg/dL   GFR calc non Af Amer 65 (*) >90 mL/min   GFR calc Af Amer 76 (*) >90 mL/min    Intake/Output Summary (Last 24 hours) at 06/23/13 0905 Last data filed at 06/23/13 0700  Gross per 24 hour  Intake 4366.25 ml  Output    150 ml  Net 4216.25 ml      ASSESSMENT AND PLAN:  Status post cholecystectomy:  No apparent cardiac complications.  No change in therapy.   CAD:   No active ischemia.  No change in therapy.  ISCHEMIC CARDIOMYOPATHY:  Euvolemic.   Continue current therapy. EF 40% on echo.   HTN:  BP has been slightly elevated.  The patient reports that it  "bounces around".  No change in therapy.  Fayrene Fearing [facility] 06/23/2013 9:05 AM

## 2013-06-23 NOTE — Progress Notes (Addendum)
Ambulated in hallway, tolerated without incident.    1826  Medicated for pain, client is splinting.  Encouraged to take pain med.  Will inform oncoming nurse about reluctance to take pain med.  Ambulated  At 1800 in hallway.

## 2013-06-24 DIAGNOSIS — E7800 Pure hypercholesterolemia, unspecified: Secondary | ICD-10-CM

## 2013-06-24 DIAGNOSIS — K5900 Constipation, unspecified: Secondary | ICD-10-CM

## 2013-06-24 LAB — BASIC METABOLIC PANEL
BUN: 31 mg/dL — ABNORMAL HIGH (ref 6–23)
CO2: 25 mEq/L (ref 19–32)
Calcium: 8.2 mg/dL — ABNORMAL LOW (ref 8.4–10.5)
Creatinine, Ser: 1.27 mg/dL (ref 0.50–1.35)
GFR calc Af Amer: 61 mL/min — ABNORMAL LOW (ref >90)
Glucose, Bld: 106 mg/dL — ABNORMAL HIGH (ref 70–99)
Potassium: 3.8 mEq/L (ref 3.5–5.1)

## 2013-06-24 LAB — CBC
HCT: 28.7 % — ABNORMAL LOW (ref 39.0–52.0)
Hemoglobin: 9.6 g/dL — ABNORMAL LOW (ref 13.0–17.0)
MCH: 30 pg (ref 26.0–34.0)
MCHC: 33.4 g/dL (ref 30.0–36.0)
MCV: 89.7 fL (ref 78.0–100.0)
RBC: 3.2 MIL/uL — ABNORMAL LOW (ref 4.22–5.81)

## 2013-06-24 MED ORDER — BISACODYL 10 MG RE SUPP
10.0000 mg | Freq: Once | RECTAL | Status: AC
  Administered 2013-06-24: 10:00:00 10 mg via RECTAL
  Filled 2013-06-24: qty 1

## 2013-06-24 NOTE — Progress Notes (Signed)
2 Days Post-Op  Subjective: Minimal pain No BM for days  Objective: Vital signs in last 24 hours: Temp:  [97.6 F (36.4 C)-99.5 F (37.5 C)] 97.7 F (36.5 C) (09/28 0500) Pulse Rate:  [60-85] 75 (09/28 0500) Resp:  [18-20] 20 (09/28 0500) BP: (108-149)/(48-63) 115/52 mmHg (09/28 0500) SpO2:  [89 %-97 %] 97 % (09/28 0500) Weight:  [190 lb 9.6 oz (86.456 kg)] 190 lb 9.6 oz (86.456 kg) (09/28 0500) Last BM Date: 06/18/13  Intake/Output from previous day: 09/27 0701 - 09/28 0700 In: 3205.4 [P.O.:1000; I.V.:2055.4; IV Piggyback:150] Out: 1275 [Urine:1275] Intake/Output this shift:    Distended but non tender  Lab Results:   Recent Labs  06/23/13 0615 06/24/13 0555  WBC 11.6* 10.0  HGB 11.5* 9.6*  HCT 34.3* 28.7*  PLT 323 273   BMET  Recent Labs  06/23/13 0615 06/24/13 0555  NA 132* 136  K 3.7 3.8  CL 97 104  CO2 22 25  GLUCOSE 123* 106*  BUN 22 31*  CREATININE 1.06 1.27  CALCIUM 8.6 8.2*   PT/INR No results found for this basename: LABPROT, INR,  in the last 72 hours ABG No results found for this basename: PHART, PCO2, PO2, HCO3,  in the last 72 hours  Studies/Results: Dg Chest Port 1 View  06/22/2013   CLINICAL DATA:  Chest congestion  EXAM: PORTABLE CHEST - 1 VIEW  COMPARISON:  04/09/2009  FINDINGS: Low lung volumes are present, causing crowding of the pulmonary vasculature. Linear opacities at both lung bases favor subsegmental atelectasis.  Old granulomatous disease noted. Prior CABG. Mild cardiomegaly.  Atherosclerotic aortic arch noted.  IMPRESSION: 1. Low lung volumes with bibasilar subsegmental atelectasis. 2. Atherosclerosis. 3. Mild cardiomegaly.   Electronically Signed   By: Herbie Baltimore   On: 06/22/2013 10:36    Anti-infectives: Anti-infectives   Start     Dose/Rate Route Frequency Ordered Stop   06/22/13 2200  piperacillin-tazobactam (ZOSYN) IVPB 3.375 g     3.375 g 12.5 mL/hr over 240 Minutes Intravenous 3 times per day 06/22/13 1434  06/25/13 2159   06/20/13 2015  [MAR Hold]  piperacillin-tazobactam (ZOSYN) IVPB 3.375 g  Status:  Discontinued     (On MAR Hold since 06/22/13 1058)   3.375 g 12.5 mL/hr over 240 Minutes Intravenous 3 times per day 06/20/13 1914 06/22/13 1434   06/20/13 0900  Ampicillin-Sulbactam (UNASYN) 3 g in sodium chloride 0.9 % 100 mL IVPB  Status:  Discontinued     3 g 100 mL/hr over 60 Minutes Intravenous Every 8 hours 06/20/13 0856 06/20/13 0856   06/20/13 0900  Ampicillin-Sulbactam (UNASYN) 3 g in sodium chloride 0.9 % 100 mL IVPB  Status:  Discontinued     3 g 100 mL/hr over 60 Minutes Intravenous Every 6 hours 06/20/13 0856 06/20/13 1844      Assessment/Plan: s/p Procedure(s): LAPAROSCOPIC CHOLECYSTECTOMY (N/A)  Regular diet Dulcolax suppos  LOS: 4 days    [name] [name] A 06/24/2013

## 2013-06-24 NOTE — Progress Notes (Signed)
ANTIBIOTIC CONSULT NOTE - Follow-Up  Pharmacy Consult for Zosyn Indication: cholycystitis  No Known Allergies  Patient Measurements: Height: 6\' 1"  (185.4 cm) Weight: 190 lb 9.6 oz (86.456 kg) (scale b) IBW/kg (Calculated) : 79.9   Vital Signs: Temp: 97.7 F (36.5 C) (09/28 0500) Temp src: Oral (09/28 0500) BP: 136/59 mmHg (09/28 0936) Pulse Rate: 64 (09/28 0938) Intake/Output from previous day: 09/27 0701 - 09/28 0700 In: 3205.4 [P.O.:1000; I.V.:2055.4; IV Piggyback:150] Out: 1275 [Urine:1275] Intake/Output from this shift:    Labs:  Recent Labs  06/22/13 0635 06/23/13 0615 06/24/13 0555  WBC 8.9 11.6* 10.0  HGB 10.7* 11.5* 9.6*  PLT 233 323 273  CREATININE 1.48* 1.06 1.27   Estimated Creatinine Clearance: 54.2 ml/min (by C-G formula based on Cr of 1.27). No results found for this basename: VANCOTROUGH, VANCOPEAK, VANCORANDOM, GENTTROUGH, GENTPEAK, GENTRANDOM, TOBRATROUGH, TOBRAPEAK, TOBRARND, AMIKACINPEAK, AMIKACINTROU, AMIKACIN,  in the last 72 hours   Microbiology: Recent Results (from the past 720 hour(s))  CULTURE, BLOOD (ROUTINE X 2)     Status: None   Collection Time    06/20/13  7:41 PM      Result Value Range Status   Specimen Description BLOOD ARM LEFT   Final   Special Requests BOTTLES DRAWN AEROBIC AND ANAEROBIC 10CC   Final   Culture  Setup Time     Final   Value: 06/21/2013 00:34     Performed at [facility]   Culture     Final   Value:        BLOOD CULTURE RECEIVED NO GROWTH TO DATE CULTURE WILL BE HELD FOR 5 DAYS BEFORE ISSUING A FINAL NEGATIVE REPORT     Performed at [facility]   Report Status PENDING   Incomplete  CULTURE, BLOOD (ROUTINE X 2)     Status: None   Collection Time    06/20/13  7:49 PM      Result Value Range Status   Specimen Description BLOOD HAND LEFT   Final   Special Requests BOTTLES DRAWN AEROBIC AND ANAEROBIC 10CC   Final   Culture  Setup Time     Final   Value: 06/21/2013 00:35     Performed at  [facility]   Culture     Final   Value:        BLOOD CULTURE RECEIVED NO GROWTH TO DATE CULTURE WILL BE HELD FOR 5 DAYS BEFORE ISSUING A FINAL NEGATIVE REPORT     Performed at [facility]   Report Status PENDING   Incomplete    Medical History: Past Medical History  Diagnosis Date  . CAD (coronary artery disease)     a. s/p  MI in 1988 and CABG 1989;  b. s/p redo CABG 2007;  c. s/p PCI DES to VG->PDA and native Diag;  d. 2010 Cath: occluded VG's to RI and PDA, patent LIMA->LAD, patent stent in diag->med Rx.  . HTN (hypertension)   . Hypercholesteremia   . CKD (chronic kidney disease), stage III   . Ischemic cardiomyopathy     a. 03/2008 EF 30% noted on Myoview.  . Gout   . History of TIAs 2000  . Prostate cancer   . Carotid arterial disease     a. s/p bilat patch angioplasties;  b. 01/2013 RICA 0-39%, LICA 40-59%  . Chronic systolic CHF (congestive heart failure)     a. 03/2008 EF 30% noted on Myoview.    Medications:  Prescriptions prior to admission  Medication Sig Dispense Refill  . allopurinol (ZYLOPRIM) 300 MG tablet Take 300 mg by mouth daily.      Marland Kitchen amLODipine (NORVASC) 10 MG tablet Take 10 mg by mouth daily.        Marland Kitchen aspirin 81 MG tablet Take 81 mg by mouth daily.        Marland Kitchen atorvastatin (LIPITOR) 40 MG tablet Take 80 mg by mouth daily.       . benazepril (LOTENSIN) 40 MG tablet Take 40 mg by mouth daily.        . clopidogrel (PLAVIX) 75 MG tablet Take 75 mg by mouth daily.      . ergocalciferol (VITAMIN D2) 50000 UNITS capsule Take 50,000 Units by mouth every 30 (thirty) days. Twice monthly 1st and 15th      . folic acid (FOLVITE) 400 MCG tablet Take 400 mcg by mouth 2 (two) times daily.       . hydrALAZINE (APRESOLINE) 25 MG tablet Take 1 tablet (25 mg total) by mouth 3 (three) times daily.  270 tablet  3  . hydrochlorothiazide (HYDRODIURIL) 25 MG tablet Take 25 mg by mouth daily.      . isosorbide mononitrate (IMDUR) 30 MG 24 hr tablet Take 30 mg by  mouth 2 (two) times daily.      . methotrexate (RHEUMATREX) 2.5 MG tablet Take 2.5 mg by mouth once a week. Caution:Chemotherapy. Protect from light. 4 tablets once a week      . metoprolol tartrate (LOPRESSOR) 25 MG tablet Take 12.5 mg by mouth 2 (two) times daily.        Marland Kitchen OVER THE COUNTER MEDICATION Take 1 tablet by mouth 2 (two) times daily. lysene      . nitroGLYCERIN (NITROSTAT) 0.4 MG SL tablet Place 1 tablet (0.4 mg total) under the tongue every 5 (five) minutes as needed.  25 tablet  11   Scheduled:  . allopurinol  300 mg Oral Daily  . amLODipine  10 mg Oral Daily  . aspirin EC  81 mg Oral Daily  . atorvastatin  80 mg Oral q1800  . benazepril  40 mg Oral Daily  . docusate sodium  100 mg Oral BID  . folic acid  1 mg Oral Daily  . hydrALAZINE  25 mg Oral TID  . isosorbide mononitrate  30 mg Oral BID  . [START ON 06/25/2013] methotrexate  10 mg Oral Q Mon  . metoprolol tartrate  12.5 mg Oral BID  . piperacillin-tazobactam (ZOSYN)  IV  3.375 g Intravenous Q8H  . sodium chloride  3 mL Intravenous Q12H  . [START ON 06/27/2013] Vitamin D (Ergocalciferol)  50,000 Units Oral Q14 Days   Assessment: 79 y.o [name] with cholycystitis and Tc 102.9 at admission.  Now s/p lap chole, on IV Zosyn 3.375g q 8 hrs (extended-interval infusion).   Blood cultures x 2 still with no growth.  WBC 10K this AM.  Scr 1.27 today, estimated CrCl ~ 55 ml/min.    Goal of Therapy:  Appropriate dose for renal function to treat infection.  Plan:  Continue Zosyn 3.375 g IV q8hr, infuse each dose over 4 hours.  Follow up on culture results, clinical status and renal function.   Tad Moore, BCPS  Clinical Pharmacist Pager [phone]  06/24/2013 2:05 PM

## 2013-06-24 NOTE — Progress Notes (Signed)
Client's wife assisted with bath, not satisfied with bath he gave himself 9/27, she was in the room with him.  We offered to assist him, but he did not want our help.  Very self reliant.  Ambulated in hallway and tolerated well.  IV saline locked.

## 2013-06-24 NOTE — Progress Notes (Signed)
[facility]                                                                                Patient Demographics  [name] [name], is a 79 y.o. male, DOB - 1932-03-09, JYN:[id number]  Admit date - 06/20/2013   Admitting Physician Leroy Sea, MD  Outpatient Primary MD for the patient is Juline Patch, MD  LOS - 4   Chief Complaint  Patient presents with  . Abdominal Pain        Assessment & Plan  1. Acute cholecystitis  POD 2, laproscopic cholecystectomy.  Bandages are clean.  Bowel sounds noted.2  Surgery following.  Continue zosyn.  Will advance diet as tolerated.     2. Acute on chronic renal failure, improved.    Creatinine 1.27. Will discontinue IVF.    3. CAD (coronary artery disease)  Patient is chest free at this time.  Cardiology following.    Continue current meds.  4. HTN (hypertension)  Continue Norvasc Imdur and beta blocker hydralazine.  5. Hypercholesteremia  Continue statin.  6.  Chronic systolic CHF (congestive heart failure)/Left ventricular dysfunction  Currently euvolemic.  Will continue monitor closely.     7. Gout, stable   8.  History of TIAs, stable   9. Constipation  Continue colace. Suppository added PRN.    Code Status: Full  Family Communication: None  Disposition Plan: Admitted.     Procedures Echocardiogram (as listed above) Study Conclusions Left ventricle: The cavity size was mildly dilated. Systolic function was mildly to moderately reduced. The estimated ejection fraction was in the range of 40% to 45%. Akinesis of the midanteroseptal myocardium. Akinesis of the basalinferior myocardium. There was an increased relative contribution of atrial contraction to ventricular filling.  Consults  Cardiology, Surgery  DVT Prophylaxis  SCDs   Lab Results  Component Value Date   PLT 273 06/24/2013    Medications  Scheduled Meds: . allopurinol  300 mg Oral Daily  . amLODipine  10 mg Oral Daily  . aspirin EC   81 mg Oral Daily  . atorvastatin  80 mg Oral q1800  . benazepril  40 mg Oral Daily  . docusate sodium  100 mg Oral BID  . folic acid  1 mg Oral Daily  . hydrALAZINE  25 mg Oral TID  . isosorbide mononitrate  30 mg Oral BID  . [START ON 06/25/2013] methotrexate  10 mg Oral Q Mon  . metoprolol tartrate  12.5 mg Oral BID  . piperacillin-tazobactam (ZOSYN)  IV  3.375 g Intravenous Q8H  . sodium chloride  3 mL Intravenous Q12H  . [START ON 06/27/2013] Vitamin D (Ergocalciferol)  50,000 Units Oral Q14 Days   Continuous Infusions: . sodium chloride 75 mL/hr at 06/23/13 2222   PRN Meds:.acetaminophen, albuterol, Glycerin (Adult), guaiFENesin-dextromethorphan, HYDROcodone-acetaminophen, morphine injection, nitroGLYCERIN, ondansetron (ZOFRAN) IV, ondansetron  Antibiotics    Anti-infectives   Start     Dose/Rate Route Frequency Ordered Stop   06/22/13 2200  piperacillin-tazobactam (ZOSYN) IVPB 3.375 g     3.375 g 12.5 mL/hr over 240 Minutes Intravenous 3 times per day 06/22/13 1434 06/25/13 2159   06/20/13 2015  [MAR Hold]  piperacillin-tazobactam (  ZOSYN) IVPB 3.375 g  Status:  Discontinued     (On MAR Hold since 06/22/13 1058)   3.375 g 12.5 mL/hr over 240 Minutes Intravenous 3 times per day 06/20/13 1914 06/22/13 1434   06/20/13 0900  Ampicillin-Sulbactam (UNASYN) 3 g in sodium chloride 0.9 % 100 mL IVPB  Status:  Discontinued     3 g 100 mL/hr over 60 Minutes Intravenous Every 8 hours 06/20/13 0856 06/20/13 0856   06/20/13 0900  Ampicillin-Sulbactam (UNASYN) 3 g in sodium chloride 0.9 % 100 mL IVPB  Status:  Discontinued     3 g 100 mL/hr over 60 Minutes Intravenous Every 6 hours 06/20/13 0856 06/20/13 1844       Time Spent in minutes   20 minutes   [name] D.O. on 06/24/2013 at 8:16 AM  Between 7am to 7pm - Pager - [phone]  After 7pm go to [email or web address] - password [email or web address]  And look for the night coverage person covering for me after hours  [facility]  Group Office  [phone]    Subjective:   [name] [name] seen and evaluated today.  He has no complaints this morning.  He wishes to eat.  He denies dizziness, headache, chest pain, SOB, N/V/D.  Patient states he has not had a bowel movement since last Monday.    Objective:   Filed Vitals:   06/23/13 1720 06/23/13 2005 06/24/13 0227 06/24/13 0500  BP: 136/57 130/59 149/63 115/52  Pulse: 85 82 77 75  Temp:  99.5 F (37.5 C) 98.2 F (36.8 C) 97.7 F (36.5 C)  TempSrc:  Oral Oral Oral  Resp:  20  20  Height:      Weight:    86.456 kg (190 lb 9.6 oz)  SpO2:  93% 97% 97%    Wt Readings from Last 3 Encounters:  06/24/13 86.456 kg (190 lb 9.6 oz)  06/24/13 86.456 kg (190 lb 9.6 oz)  03/28/13 85.639 kg (188 lb 12.8 oz)     Intake/Output Summary (Last 24 hours) at 06/24/13 0816 Last data filed at 06/24/13 0600  Gross per 24 hour  Intake 1784.17 ml  Output   1275 ml  Net 509.17 ml    Exam General: Well developed, NAD HEENT: [place]/AT,PERRAL  Neck: Supple Neck,No JVD, No cervical lymphadenopathy appriciated.  Respiratory: Symmetrical Chest wall movement, Good air movement bilaterally, CTAB  Cardiovascular: RRR. No Gallops,Rubs or new Murmurs  Abdomen: Soft, nontender, distended, positive bowel sounds, no organomegaly palpated.  Clean bandages noted.  Extremities: No Cyanosis, Clubbing, edema Neuro: Awake Alert, Oriented X 3, Normal affect, Grossly intact, nonfocal.    Data Review   Micro Results Recent Results (from the past 240 hour(s))  CULTURE, BLOOD (ROUTINE X 2)     Status: None   Collection Time    06/20/13  7:41 PM      Result Value Range Status   Specimen Description BLOOD ARM LEFT   Final   Special Requests BOTTLES DRAWN AEROBIC AND ANAEROBIC 10CC   Final   Culture  Setup Time     Final   Value: 06/21/2013 00:34     Performed at [facility]   Culture     Final   Value:        BLOOD CULTURE RECEIVED NO GROWTH TO DATE CULTURE WILL BE HELD FOR 5  DAYS BEFORE ISSUING A FINAL NEGATIVE REPORT     Performed at [facility]   Report Status PENDING   Incomplete  CULTURE,  BLOOD (ROUTINE X 2)     Status: None   Collection Time    06/20/13  7:49 PM      Result Value Range Status   Specimen Description BLOOD HAND LEFT   Final   Special Requests BOTTLES DRAWN AEROBIC AND ANAEROBIC 10CC   Final   Culture  Setup Time     Final   Value: 06/21/2013 00:35     Performed at [facility]   Culture     Final   Value:        BLOOD CULTURE RECEIVED NO GROWTH TO DATE CULTURE WILL BE HELD FOR 5 DAYS BEFORE ISSUING A FINAL NEGATIVE REPORT     Performed at [facility]   Report Status PENDING   Incomplete    Radiology Reports Ct Abdomen Pelvis W Contrast  06/20/2013   CLINICAL DATA:  Abdominal pain.  EXAM: CT ABDOMEN AND PELVIS WITH CONTRAST  TECHNIQUE: Multidetector CT imaging of the abdomen and pelvis was performed using the standard protocol following bolus administration of intravenous contrast.  CONTRAST:  OMNIPAQUE IOHEXOL 300 MG/ML  SOLN  COMPARISON:  02/23/2009.  FINDINGS: BODY WALL: Unremarkable.  LOWER CHEST:  Mediastinum: Coronary artery atherosclerosis and aortic annular calcification. Mild cardiomegaly.  Lungs/pleura: Calcified right hilar lymph nodes.  ABDOMEN/PELVIS:  Liver: Coarse calcification noted. Circumscribed sub cm low-attenuation areas around the gallbladder fossa were likely present previously, compatible with benign entity such is cysts.  Biliary: Cholelithiasis. There is gallbladder distention and gallbladder wall thickening with probable mild pericholecystic fat haziness. No biliary ductal enlargement.  Pancreas: Unremarkable.  Spleen: Numerous coarse calcifications consistent with previous granulomatous infection.  Adrenals: Unchanged 18 mm nodule in the medial limb right adrenal gland, consistent with adenoma.  Kidneys and ureters: Numerous low dense renal masses, size similar to priors when accounting  for long interval. A 60 Hounsfield unit, 12 mm diameter nodule from the low interpolar left kidney is unchanged in size and density, most likely hemorrhagic cyst. There is a notably large cyst from the lower pole right kidney, 11 cm in diameter. No hydronephrosis.  Bladder: Unremarkable.  Bowel: No obstruction.   Extensive colonic diverticulosis.  Retroperitoneum: No mass or adenopathy.  Peritoneum: No free fluid or gas.  Reproductive: Prostate brachytherapy seeds.  Vascular: No acute abnormality.  OSSEOUS: No acute abnormalities. No suspicious lytic or blastic lesions.  IMPRESSION: 1. In the appropriate clinical setting, findings consistent with acute cholecystitis (gallbladder distention, cholelithiasis, and gallbladder wall thickening). 2. Bilateral renal cystic disease, appearance stable from 2010. 3. Colonic diverticulosis. 4. 18 mm right adrenal adenoma.   Electronically Signed   By: Tiburcio Pea   On: 06/20/2013 05:25    CBC  Recent Labs Lab 06/20/13 0250 06/21/13 0830 06/22/13 0635 06/23/13 0615 06/24/13 0555  WBC 8.8 9.2 8.9 11.6* 10.0  HGB 13.2 11.3* 10.7* 11.5* 9.6*  HCT 37.5* 33.8* 30.8* 34.3* 28.7*  PLT 243 234 233 323 273  MCV 87.6 88.9 87.7 88.4 89.7  MCH 30.8 29.7 30.5 29.6 30.0  MCHC 35.2 33.4 34.7 33.5 33.4  RDW 14.9 15.5 15.3 15.3 15.6*  LYMPHSABS 0.9  --   --   --   --   MONOABS 0.5  --   --   --   --   EOSABS 0.1  --   --   --   --   BASOSABS 0.0  --   --   --   --     Chemistries   Recent  Labs Lab 06/20/13 0250 06/21/13 0830 06/22/13 0635 06/23/13 0615 06/24/13 0555  NA 138 130* 131* 132* 136  K 3.7 3.4* 3.3* 3.7 3.8  CL 100 94* 96 97 104  CO2 24 25 23 22 25   GLUCOSE 145* 136* 114* 123* 106*  BUN 19 30* 28* 22 31*  CREATININE 1.06 1.82* 1.48* 1.06 1.27  CALCIUM 9.2 8.5 8.2* 8.6 8.2*  AST 19 19 22   --   --   ALT 15 13 14   --   --   ALKPHOS 108 95 93  --   --   BILITOT 2.2* 2.5* 2.3*  --   --     ------------------------------------------------------------------------------------------------------------------ estimated creatinine clearance is 54.2 ml/min (by C-G formula based on Cr of 1.27). ------------------------------------------------------------------------------------------------------------------ No results found for this basename: HGBA1C,  in the last 72 hours ------------------------------------------------------------------------------------------------------------------ No results found for this basename: CHOL, HDL, LDLCALC, TRIG, CHOLHDL, LDLDIRECT,  in the last 72 hours ------------------------------------------------------------------------------------------------------------------ No results found for this basename: TSH, T4TOTAL, FREET3, T3FREE, THYROIDAB,  in the last 72 hours ------------------------------------------------------------------------------------------------------------------ No results found for this basename: VITAMINB12, FOLATE, FERRITIN, TIBC, IRON, RETICCTPCT,  in the last 72 hours  Coagulation profile  Recent Labs Lab 06/20/13 1421  INR 1.10    No results found for this basename: DDIMER,  in the last 72 hours  Cardiac Enzymes No results found for this basename: CK, CKMB, TROPONINI, MYOGLOBIN,  in the last 168 hours ------------------------------------------------------------------------------------------------------------------ No components found with this basename: POCBNP,

## 2013-06-25 DIAGNOSIS — M109 Gout, unspecified: Secondary | ICD-10-CM

## 2013-06-25 LAB — BASIC METABOLIC PANEL
Calcium: 8.3 mg/dL — ABNORMAL LOW (ref 8.4–10.5)
GFR calc Af Amer: 71 mL/min — ABNORMAL LOW (ref >90)
GFR calc non Af Amer: 61 mL/min — ABNORMAL LOW (ref >90)
Glucose, Bld: 93 mg/dL (ref 70–99)
Sodium: 136 mEq/L (ref 135–145)

## 2013-06-25 LAB — CBC
Hemoglobin: 10 g/dL — ABNORMAL LOW (ref 13.0–17.0)
MCH: 29.9 pg (ref 26.0–34.0)
MCHC: 33.8 g/dL (ref 30.0–36.0)
MCV: 88.6 fL (ref 78.0–100.0)
Platelets: 317 10*3/uL (ref 150–400)
RBC: 3.34 MIL/uL — ABNORMAL LOW (ref 4.22–5.81)
RDW: 15.6 % — ABNORMAL HIGH (ref 11.5–15.5)

## 2013-06-25 MED ORDER — GUAIFENESIN-DM 100-10 MG/5ML PO SYRP: 5.0000 mL | ORAL_SOLUTION | Freq: Four times a day (QID) | ORAL | Status: DC | PRN

## 2013-06-25 MED ORDER — HYDROCODONE-ACETAMINOPHEN 5-325 MG PO TABS: 1.0000 | ORAL_TABLET | ORAL | Status: DC | PRN

## 2013-06-25 NOTE — Progress Notes (Signed)
3 Days Post-Op  Subjective: No complaints other than an occasional cough with sputum that was difficult to expectorate this morning; no abdominal pain, fever, or chest pain. Reports BM yesterday morning with decreased feeling of bloating.  Objective: Vital signs in last 24 hours: Temp:  [98.7 F (37.1 C)-99.6 F (37.6 C)] 99.6 F (37.6 C) (09/29 0534) Pulse Rate:  [64-76] 65 (09/29 0534) Resp:  [16-19] 17 (09/29 0534) BP: (134-148)/(54-62) 143/55 mmHg (09/29 0534) SpO2:  [88 %-93 %] 92 % (09/29 0536) Weight:  [190 lb 11.2 oz (86.501 kg)] 190 lb 11.2 oz (86.501 kg) (09/29 0534) Last BM Date: 06/24/13 ( large semi formed brown stool)  Intake/Output from previous day: 09/28 0701 - 09/29 0700 In: 890 [P.O.:740; IV Piggyback:150] Out: 775 [Urine:775] Intake/Output this shift: Total I/O In: 360 [P.O.:360] Out: -   PE: Abd: Soft, nontender, mildly distended, NABS, no organomegaly palpated, port sites clean, dry and in tact Pulm: Normal effort, occasional cough  Lab Results:   Recent Labs  06/24/13 0555 06/25/13 0440  WBC 10.0 8.2  HGB 9.6* 10.0*  HCT 28.7* 29.6*  PLT 273 317   BMET  Recent Labs  06/24/13 0555 06/25/13 0440  NA 136 136  K 3.8 3.7  CL 104 102  CO2 25 23  GLUCOSE 106* 93  BUN 31* 22  CREATININE 1.27 1.12  CALCIUM 8.2* 8.3*   PT/INR No results found for this basename: LABPROT, INR,  in the last 72 hours CMP     Component Value Date/Time   NA 136 06/25/2013 0440   K 3.7 06/25/2013 0440   CL 102 06/25/2013 0440   CO2 23 06/25/2013 0440   GLUCOSE 93 06/25/2013 0440   BUN 22 06/25/2013 0440   CREATININE 1.12 06/25/2013 0440   CALCIUM 8.3* 06/25/2013 0440   PROT 5.7* 06/22/2013 0635   ALBUMIN 2.4* 06/22/2013 0635   AST 22 06/22/2013 0635   ALT 14 06/22/2013 0635   ALKPHOS 93 06/22/2013 0635   BILITOT 2.3* 06/22/2013 0635   GFRNONAA 61* 06/25/2013 0440   GFRAA 71* 06/25/2013 0440   Lipase     Component Value Date/Time   LIPASE 59 06/20/2013 0250        Studies/Results: No results found.  Anti-infectives: Anti-infectives   Start     Dose/Rate Route Frequency Ordered Stop   06/22/13 2200  piperacillin-tazobactam (ZOSYN) IVPB 3.375 g     3.375 g 12.5 mL/hr over 240 Minutes Intravenous 3 times per day 06/22/13 1434 06/25/13 2159   06/20/13 2015  [MAR Hold]  piperacillin-tazobactam (ZOSYN) IVPB 3.375 g  Status:  Discontinued     (On MAR Hold since 06/22/13 1058)   3.375 g 12.5 mL/hr over 240 Minutes Intravenous 3 times per day 06/20/13 1914 06/22/13 1434   06/20/13 0900  Ampicillin-Sulbactam (UNASYN) 3 g in sodium chloride 0.9 % 100 mL IVPB  Status:  Discontinued     3 g 100 mL/hr over 60 Minutes Intravenous Every 8 hours 06/20/13 0856 06/20/13 0856   06/20/13 0900  Ampicillin-Sulbactam (UNASYN) 3 g in sodium chloride 0.9 % 100 mL IVPB  Status:  Discontinued     3 g 100 mL/hr over 60 Minutes Intravenous Every 6 hours 06/20/13 0856 06/20/13 1844       Assessment/Plan 1. Cholecystitis 2. Chronic renal insufficiency 3. Complex cardiovascular hx including CAD s/p CABG x 2, chronic systolic CHF, h/o TIAs  Plan:  - 3 days s/p laparoscopic cholecystectomy after cleared by cardiology, doing well - no  leukocytosis, fever, or pain; tolerating regular diet now - From surgical standpoint, may be discharged home with follow-up scheduled with [facility] - Will sign off; please re-consult with any new surgical issues    LOS: 5 days    Leona Singleton, MD 06/25/2013, 9:11 AM Pager: [phone]

## 2013-06-25 NOTE — Discharge Summary (Signed)
Physician Discharge Summary  [name] [name] ZOX:[id number] DOB: Jun 02, 1932 DOA: 06/20/2013  PCP: Juline Patch, MD  Admit date: 06/20/2013 Discharge date: 06/25/2013  Time spent: 45 minutes  Recommendations for Outpatient Follow-up:  Please follow up with PCP within one week of discharge.  Also follow up with surgery at specified time.  Follow cardiac, heart healthy diet with 1.5L/day fluid restriction.  Discharge Diagnoses:  Principal Problem:   Acute cholecystitis Active Problems:   CAD (coronary artery disease)   HTN (hypertension)   Hypercholesteremia   Chronic renal insufficiency   Left ventricular dysfunction   Gout   History of TIAs   Prostate cancer   Carotid arterial disease   Chronic systolic CHF (congestive heart failure)   Acute renal failure   Unspecified constipation   Discharge Condition: Stable  Diet recommendation: Heart healthy, 1.5L/day fluid restriction, low sodium  Filed Weights   06/23/13 0642 06/24/13 0500 06/25/13 0534  Weight: 85.322 kg (188 lb 1.6 oz) 86.456 kg (190 lb 9.6 oz) 86.501 kg (190 lb 11.2 oz)    History of present illness:  [name] [name] is a 79 y.o. male, history of CAD status post CABG x2 in the past, coronary artery stent placed more than 7 years ago, follows with Dr. Peter [place], history of prostate cancer status post radiation treatment, chronic systolic heart failure last EF around 40% currently compensated, chronic kidney disease stage III, hypertension, dyslipidemia, comes to the hospital with three-day history of sharp constant right upper quadrant abdominal pain which is nonradiating, worse with taking a deep breath and needs better with rest. No associated symptoms, no nausea vomiting or diarrhea, no fever chills. Denies any chest pain palpitations cough phlegm shortness of breath. No focal weakness. Came to the ER where his workup was consistent with acute cholecystitis, he was seen by general surgery and cardiology and  hospitalist was requested to admit the patient.   Hospital Course:  This is a 79 year old male with a history of coronary artery disease status post CABG, chronic systolic heart failure, but present to the emergency department for right upper quadrant abdominal pain.  CT of the abdomen and pelvis was conducted showing acute cholecystitis with gallbladder distention cholelithiasis and gallbladder wall thickening.  Patient was admitted and started on a broad-spectrum antibiotics. Cardiology was consulted for clearance, and echocardiogram was ordered.  Results of the echocardiogram are listed below. Surgery was consult is for cholecystectomy.  Patient underwent a laparoscopic cholecystectomy and was found to have an acute gangrenous cholecystitis with cholelithiasis.  Patient was continued on brought spectrum antibiotics with Zosyn.  Patient was started on a clear liquid diet and transitioned to regular diet heart healthy. He did tolerate the diet well. He was able to ambulate. Patient did have a distended abdomen and was given a suppository, which was successful. During the course patient was encouraged to continue incentive spirometry. He is to continue this as an outpatient as well. Patient to follow up with his primary care physician within one week of discharge. He should also follow up with surgery at the specified time.  Procedures: Echocardiogram (as listed above)  Study Conclusions Left ventricle: The cavity size was mildly dilated. Systolic function was mildly to moderately reduced. The estimated ejection fraction was in the range of 40% to 45%. Akinesis of the midanteroseptal myocardium. Akinesis of the basalinferior myocardium. There was an increased relative contribution of atrial contraction to ventricular filling.   Laproscopic Cholecystectomy   Consultations:  Cardiology  Surgery  Discharge Exam:  Filed Vitals:   06/25/13 0534  BP: 143/55  Pulse: 65  Temp: 99.6 F (37.6 C)   Resp: 17    General: Well developed, NAD  HEENT: [place]/AT,PERRAL  Neck: Supple Neck,No JVD, No cervical lymphadenopathy appriciated.  Respiratory: Symmetrical Chest wall movement, Good air movement bilaterally, occasional cough, scattered rhonchi noted, but clears with cough  Cardiovascular: RRR. No Gallops,Rubs or new Murmurs  Abdomen: Soft, nontender, mildly distended, positive bowel sounds, no organomegaly palpated. Bandages in place. Extremities: No Cyanosis, Clubbing, edema  Neuro: Awake Alert, Oriented X 3, Normal affect, Grossly intact, nonfocal.   Discharge Instructions  Discharge Orders   Future Appointments Provider Department Dept Phone   07/17/2013 2:15 PM [facility], [place] [phone]   Future Orders Complete By Expires   (HEART FAILURE PATIENTS) Call MD:  Anytime you have any of the following symptoms: 1) 3 pound weight gain in 24 hours or 5 pounds in 1 week 2) shortness of breath, with or without a dry hacking cough 3) swelling in the hands, feet or stomach 4) if you have to sleep on extra pillows at night in order to breathe.  As directed    Diet - low sodium heart healthy  As directed    Discharge instructions  As directed    Comments:     Please follow up with PCP within one week of discharge.  Also follow up with surgery at specified time.  Follow cardiac, heart healthy diet with 1.5L/day fluid restriction.   Increase activity slowly  As directed    Scheduling Instructions:     1.5L/day fluid restriction       Medication List         allopurinol 300 MG tablet  Commonly known as:  ZYLOPRIM  Take 300 mg by mouth daily.     amLODipine 10 MG tablet  Commonly known as:  NORVASC  Take 10 mg by mouth daily.     aspirin 81 MG tablet  Take 81 mg by mouth daily.     atorvastatin 40 MG tablet  Commonly known as:  LIPITOR  Take 80 mg by mouth daily.     benazepril 40 MG tablet  Commonly known as:  LOTENSIN  Take 40 mg by  mouth daily.     clopidogrel 75 MG tablet  Commonly known as:  PLAVIX  Take 75 mg by mouth daily.     ergocalciferol 50000 UNITS capsule  Commonly known as:  VITAMIN D2  Take 50,000 Units by mouth every 30 (thirty) days. Twice monthly 1st and 15th     folic acid 400 MCG tablet  Commonly known as:  FOLVITE  Take 400 mcg by mouth 2 (two) times daily.     guaiFENesin-dextromethorphan 100-10 MG/5ML syrup  Commonly known as:  ROBITUSSIN DM  Take 5 mLs by mouth 4 (four) times daily as needed for cough.     hydrALAZINE 25 MG tablet  Commonly known as:  APRESOLINE  Take 1 tablet (25 mg total) by mouth 3 (three) times daily.     hydrochlorothiazide 25 MG tablet  Commonly known as:  HYDRODIURIL  Take 25 mg by mouth daily.     HYDROcodone-acetaminophen 5-325 MG per tablet  Commonly known as:  NORCO/VICODIN  Take 1 tablet by mouth every 4 (four) hours as needed for pain.     isosorbide mononitrate 30 MG 24 hr tablet  Commonly known as:  IMDUR  Take 30 mg by mouth 2 (  two) times daily.     methotrexate 2.5 MG tablet  Commonly known as:  RHEUMATREX  Take 2.5 mg by mouth once a week. Caution:Chemotherapy. Protect from light. 4 tablets once a week     metoprolol tartrate 25 MG tablet  Commonly known as:  LOPRESSOR  Take 12.5 mg by mouth 2 (two) times daily.     nitroGLYCERIN 0.4 MG SL tablet  Commonly known as:  NITROSTAT  Place 1 tablet (0.4 mg total) under the tongue every 5 (five) minutes as needed.     OVER THE COUNTER MEDICATION  Take 1 tablet by mouth 2 (two) times daily. lysene       No Known Allergies     Follow-up Information   Follow up with [facility] On 07/17/2013. (2:15pm, arrive at 1:45pm)    Contact information:   [address]   [place] [address] [phone]       Follow up with Juline Patch, MD. Schedule an appointment as soon as possible for a visit in 1 week.   Specialty:  Internal Medicine   Contact information:   [address], [address] [place] [address] [phone]        The results of significant diagnostics from this hospitalization (including imaging, microbiology, ancillary and laboratory) are listed below for reference.    Significant Diagnostic Studies: Ct Abdomen Pelvis W Contrast  06/20/2013   CLINICAL DATA:  Abdominal pain.  EXAM: CT ABDOMEN AND PELVIS WITH CONTRAST  TECHNIQUE: Multidetector CT imaging of the abdomen and pelvis was performed using the standard protocol following bolus administration of intravenous contrast.  CONTRAST:  OMNIPAQUE IOHEXOL 300 MG/ML  SOLN  COMPARISON:  02/23/2009.  FINDINGS: BODY WALL: Unremarkable.  LOWER CHEST:  Mediastinum: Coronary artery atherosclerosis and aortic annular calcification. Mild cardiomegaly.  Lungs/pleura: Calcified right hilar lymph nodes.  ABDOMEN/PELVIS:  Liver: Coarse calcification noted. Circumscribed sub cm low-attenuation areas around the gallbladder fossa were likely present previously, compatible with benign entity such is cysts.  Biliary: Cholelithiasis. There is gallbladder distention and gallbladder wall thickening with probable mild pericholecystic fat haziness. No biliary ductal enlargement.  Pancreas: Unremarkable.  Spleen: Numerous coarse calcifications consistent with previous granulomatous infection.  Adrenals: Unchanged 18 mm nodule in the medial limb right adrenal gland, consistent with adenoma.  Kidneys and ureters: Numerous low dense renal masses, size similar to priors when accounting for long interval. A 60 Hounsfield unit, 12 mm diameter nodule from the low interpolar left kidney is unchanged in size and density, most likely hemorrhagic cyst. There is a notably large cyst from the lower pole right kidney, 11 cm in diameter. No hydronephrosis.  Bladder: Unremarkable.  Bowel: No obstruction.   Extensive colonic diverticulosis.  Retroperitoneum: No mass or adenopathy.  Peritoneum: No free fluid or gas.  Reproductive:  Prostate brachytherapy seeds.  Vascular: No acute abnormality.  OSSEOUS: No acute abnormalities. No suspicious lytic or blastic lesions.  IMPRESSION: 1. In the appropriate clinical setting, findings consistent with acute cholecystitis (gallbladder distention, cholelithiasis, and gallbladder wall thickening). 2. Bilateral renal cystic disease, appearance stable from 2010. 3. Colonic diverticulosis. 4. 18 mm right adrenal adenoma.   Electronically Signed   By: Tiburcio Pea   On: 06/20/2013 05:25   Dg Chest Port 1 View  06/22/2013   CLINICAL DATA:  Chest congestion  EXAM: PORTABLE CHEST - 1 VIEW  COMPARISON:  04/09/2009  FINDINGS: Low lung volumes are present, causing crowding of the pulmonary vasculature.  Linear opacities at both lung bases favor subsegmental atelectasis.  Old granulomatous disease noted. Prior CABG. Mild cardiomegaly.  Atherosclerotic aortic arch noted.  IMPRESSION: 1. Low lung volumes with bibasilar subsegmental atelectasis. 2. Atherosclerosis. 3. Mild cardiomegaly.   Electronically Signed   By: Herbie Baltimore   On: 06/22/2013 10:36    Microbiology: Recent Results (from the past 240 hour(s))  CULTURE, BLOOD (ROUTINE X 2)     Status: None   Collection Time    06/20/13  7:41 PM      Result Value Range Status   Specimen Description BLOOD ARM LEFT   Final   Special Requests BOTTLES DRAWN AEROBIC AND ANAEROBIC 10CC   Final   Culture  Setup Time     Final   Value: 06/21/2013 00:34     Performed at [facility]   Culture     Final   Value:        BLOOD CULTURE RECEIVED NO GROWTH TO DATE CULTURE WILL BE HELD FOR 5 DAYS BEFORE ISSUING A FINAL NEGATIVE REPORT     Performed at [facility]   Report Status PENDING   Incomplete  CULTURE, BLOOD (ROUTINE X 2)     Status: None   Collection Time    06/20/13  7:49 PM      Result Value Range Status   Specimen Description BLOOD HAND LEFT   Final   Special Requests BOTTLES DRAWN AEROBIC AND ANAEROBIC 10CC   Final   Culture   Setup Time     Final   Value: 06/21/2013 00:35     Performed at [facility]   Culture     Final   Value:        BLOOD CULTURE RECEIVED NO GROWTH TO DATE CULTURE WILL BE HELD FOR 5 DAYS BEFORE ISSUING A FINAL NEGATIVE REPORT     Performed at [facility]   Report Status PENDING   Incomplete     Labs: Basic Metabolic Panel:  Recent Labs Lab 06/21/13 0830 06/22/13 0635 06/23/13 0615 06/24/13 0555 06/25/13 0440  NA 130* 131* 132* 136 136  K 3.4* 3.3* 3.7 3.8 3.7  CL 94* 96 97 104 102  CO2 25 23 22 25 23   GLUCOSE 136* 114* 123* 106* 93  BUN 30* 28* 22 31* 22  CREATININE 1.82* 1.48* 1.06 1.27 1.12  CALCIUM 8.5 8.2* 8.6 8.2* 8.3*   Liver Function Tests:  Recent Labs Lab 06/20/13 0250 06/21/13 0830 06/22/13 0635  AST 19 19 22   ALT 15 13 14   ALKPHOS 108 95 93  BILITOT 2.2* 2.5* 2.3*  PROT 6.9 6.0 5.7*  ALBUMIN 3.7 2.7* 2.4*    Recent Labs Lab 06/20/13 0250  LIPASE 59   No results found for this basename: AMMONIA,  in the last 168 hours CBC:  Recent Labs Lab 06/20/13 0250 06/21/13 0830 06/22/13 0635 06/23/13 0615 06/24/13 0555 06/25/13 0440  WBC 8.8 9.2 8.9 11.6* 10.0 8.2  NEUTROABS 7.3  --   --   --   --   --   HGB 13.2 11.3* 10.7* 11.5* 9.6* 10.0*  HCT 37.5* 33.8* 30.8* 34.3* 28.7* 29.6*  MCV 87.6 88.9 87.7 88.4 89.7 88.6  PLT 243 234 233 323 273 317   Cardiac Enzymes: No results found for this basename: CKTOTAL, CKMB, CKMBINDEX, TROPONINI,  in the last 168 hours BNP: BNP (last 3 results) No results found for this basename: PROBNP,  in the last 8760 hours CBG: No results  found for this basename: GLUCAP,  in the last 168 hours     Signed:  Edsel Petrin  [facility] 06/25/2013, 9:22 AM

## 2013-06-25 NOTE — Progress Notes (Signed)
Ready for discharge. Incisions c/d/i  Wilmon Arms. Corliss Skains, MD, [facility]  General/ Trauma Surgery  06/25/2013 9:26 AM

## 2013-06-25 NOTE — Progress Notes (Signed)
DC IV, DC Tele, DC Home. Discharge instructions and home medications discussed with patient and patient's wife. Patient and wife denied any questions or concerns at this time. Patient leaving unit via wheelchair and appears in no acute distress. 

## 2013-06-25 NOTE — Progress Notes (Signed)
Co-signed for [employer] RN/BSN for medications administrations and vitals signs obtained. Ernesta Amble, RN/BSN

## 2013-06-26 ENCOUNTER — Encounter (HOSPITAL_COMMUNITY): Payer: Self-pay | Admitting: General Surgery

## 2013-06-27 LAB — CULTURE, BLOOD (ROUTINE X 2): Culture: NO GROWTH

## 2013-07-01 ENCOUNTER — Other Ambulatory Visit: Payer: Self-pay | Admitting: Cardiology

## 2013-07-16 NOTE — Anesthesia Postprocedure Evaluation (Signed)
Anesthesia Post Note  Patient: [name] [name]  Procedure(s) Performed: Procedure(s) (LRB): LAPAROSCOPIC CHOLECYSTECTOMY (N/A)  Anesthesia type: General  Patient location: PACU  Post pain: Pain level controlled and Adequate analgesia  Post assessment: Post-op Vital signs reviewed, Patient's Cardiovascular Status Stable, Respiratory Function Stable, Patent Airway and Pain level controlled  Last Vitals:  Filed Vitals:   06/25/13 1040  BP: 135/71  Pulse:   Temp:   Resp:     Post vital signs: Reviewed and stable  Level of consciousness: awake, alert  and oriented  Complications: No apparent anesthesia complications

## 2013-07-17 ENCOUNTER — Ambulatory Visit (INDEPENDENT_AMBULATORY_CARE_PROVIDER_SITE_OTHER): Payer: Medicare Other | Admitting: General Surgery

## 2013-07-17 ENCOUNTER — Encounter (INDEPENDENT_AMBULATORY_CARE_PROVIDER_SITE_OTHER): Payer: Self-pay | Admitting: General Surgery

## 2013-07-17 VITALS — BP 124/78 | HR 80 | Temp 97.5°F | Resp 16 | Ht 73.0 in | Wt 174.6 lb

## 2013-07-17 DIAGNOSIS — K8000 Calculus of gallbladder with acute cholecystitis without obstruction: Secondary | ICD-10-CM

## 2013-07-17 NOTE — Progress Notes (Signed)
[name] [name] 05-06-1932 [id number] 07/17/2013   [name] [name] is a 79 y.o. male who had a laparoscopic cholecystectomy with intraoperative cholangiogram by Dr. Gaynelle Adu.  The pathology report confirmed chronic active cholecystitis.  The patient reports that they are feeling well with normal bowel movements and improving appetite.  The pre-operative symptoms of abdominal pain, nausea, and vomiting have resolved.    Physical examination - Incisions appear well-healed with no sign of infection or bleeding.   Abdomen - soft, non-tender  Impression:  s/p laparoscopic cholecystectomy  Plan:  He may resume a regular diet and full activity.  He may follow-up on a PRN basis. Drink Ensure or some type of protein shake while appetite is not as good.

## 2013-07-17 NOTE — Patient Instructions (Signed)
Follow up as needed Ensure or Boost while appetite is minimal

## 2013-09-06 ENCOUNTER — Other Ambulatory Visit: Payer: Self-pay | Admitting: Cardiology

## 2013-09-09 ENCOUNTER — Other Ambulatory Visit: Payer: Self-pay | Admitting: Cardiology

## 2013-09-13 ENCOUNTER — Other Ambulatory Visit: Payer: Self-pay | Admitting: Cardiology

## 2013-11-08 ENCOUNTER — Other Ambulatory Visit: Payer: Self-pay | Admitting: Cardiology

## 2013-11-21 ENCOUNTER — Ambulatory Visit: Payer: Medicare Other | Admitting: Cardiology

## 2013-11-27 ENCOUNTER — Telehealth: Payer: Self-pay | Admitting: Cardiology

## 2013-11-27 NOTE — Telephone Encounter (Signed)
Patient went to hospital for 23 hours in January in [place]. Was told to call doctor in [place] to schedule follow up stress test but he wanted to have with Dr Martinique. Patient at that time thought he would be follow up in Feb.  Will forward to [facility] LPN

## 2013-11-27 NOTE — Telephone Encounter (Signed)
New problem        Pt would like an appt with dr Martinique. Pt was in the [place] in [place] this Jan. 2014 Sooner if possible he was bumped for weather reasons 2/25 appt please give pt a call to schedule.   mon or tue appt after 2 pm

## 2013-11-28 NOTE — Telephone Encounter (Signed)
Returned call to patient she stated husband had episode of chest pain while they were in [facility] in 09/2013.Stated she took him to a ER there and Dr.did not think pain was from his heart,but thought he needed a stress test.Stated husband did not want to have done down there.Stated he had appointment with Dr.Jordan last week but had to cancel due to snow.Appointment rescheduled with Dr.Jordan 12/14/13.Advised to call sooner if needed.

## 2013-12-12 ENCOUNTER — Other Ambulatory Visit: Payer: Self-pay | Admitting: Cardiology

## 2013-12-14 ENCOUNTER — Ambulatory Visit (INDEPENDENT_AMBULATORY_CARE_PROVIDER_SITE_OTHER): Payer: Medicare HMO | Admitting: Cardiology

## 2013-12-14 ENCOUNTER — Encounter: Payer: Self-pay | Admitting: Cardiology

## 2013-12-14 VITALS — BP 177/72 | HR 62 | Ht 73.0 in | Wt 182.0 lb

## 2013-12-14 DIAGNOSIS — I2510 Atherosclerotic heart disease of native coronary artery without angina pectoris: Secondary | ICD-10-CM

## 2013-12-14 DIAGNOSIS — I519 Heart disease, unspecified: Secondary | ICD-10-CM

## 2013-12-14 DIAGNOSIS — E7800 Pure hypercholesterolemia, unspecified: Secondary | ICD-10-CM

## 2013-12-14 DIAGNOSIS — I10 Essential (primary) hypertension: Secondary | ICD-10-CM

## 2013-12-14 NOTE — Patient Instructions (Signed)
Continue your current therapy  I will see you in 6 months.   

## 2013-12-15 NOTE — Progress Notes (Signed)
HPI: [name] [name] is seen today for followup. He has a history of coronary disease and is status post redo coronary bypass surgery in 2007. Cardiac catheterization in 2010 showed occlusion of the vein graft to the intermediate vessel and to the posterior descending artery. He has a patent LIMA graft to the LAD and moderate left ventricular dysfunction. He has prior stenting of the first diagonal branch.  He reports that while staying in [place]. In January he experienced acute epigastric pain radiating underneath his ribs. He was admitted overnight and ruled out for MI. He states his symptoms only lasted 5-10 minutes. The doctor there wanted him to come back for an outpatient stress but [name] [name] didn't think this was necessary since his symptoms were not at all like his prior angina. He feels fine now. He has been started on metformin for diabetes.   No Known Allergies  Current Outpatient Prescriptions on File Prior to Visit  Medication Sig Dispense Refill  . allopurinol (ZYLOPRIM) 300 MG tablet Take 300 mg by mouth daily.      Marland Kitchen allopurinol (ZYLOPRIM) 300 MG tablet TAKE 1 TABLET BY MOUTH DAILY  30 tablet  0  . allopurinol (ZYLOPRIM) 300 MG tablet TAKE 1 TABLET BY MOUTH DAILY  90 tablet  0  . amLODipine (NORVASC) 10 MG tablet Take 10 mg by mouth daily.        Marland Kitchen aspirin 81 MG tablet Take 81 mg by mouth daily.        Marland Kitchen atorvastatin (LIPITOR) 40 MG tablet Take 80 mg by mouth daily.       . benazepril (LOTENSIN) 40 MG tablet Take 40 mg by mouth daily.        . clopidogrel (PLAVIX) 75 MG tablet Take 75 mg by mouth daily.      . ergocalciferol (VITAMIN D2) 50000 UNITS capsule Take 50,000 Units by mouth every 30 (thirty) days. Twice monthly 1st and 36UY      . folic acid (FOLVITE) 403 MCG tablet Take 400 mcg by mouth 2 (two) times daily.       Marland Kitchen guaiFENesin-dextromethorphan (ROBITUSSIN DM) 100-10 MG/5ML syrup Take 5 mLs by mouth 4 (four) times daily as needed for cough.  118 mL  0  . hydrALAZINE (APRESOLINE) 25 MG tablet  TAKE 1 TABLET BY MOUTH 3 TIMES A DAY  270 tablet  0  . hydrochlorothiazide (HYDRODIURIL) 25 MG tablet Take 25 mg by mouth daily.      . isosorbide mononitrate (IMDUR) 30 MG 24 hr tablet Take 30 mg by mouth 2 (two) times daily.      . isosorbide mononitrate (IMDUR) 30 MG 24 hr tablet TAKE 1 TABLET BY MOUTH TWICE A DAY  180 tablet  0  . methotrexate (RHEUMATREX) 2.5 MG tablet Take 2.5 mg by mouth once a week. Caution:Chemotherapy. Protect from light. 4 tablets once a week      . metoprolol tartrate (LOPRESSOR) 25 MG tablet Take 12.5 mg by mouth 2 (two) times daily.        . nitroGLYCERIN (NITROSTAT) 0.4 MG SL tablet Place 1 tablet (0.4 mg total) under the tongue every 5 (five) minutes as needed.  25 tablet  11  . OVER THE COUNTER MEDICATION Take 1 tablet by mouth 2 (two) times daily. lysene       No current facility-administered medications on file prior to visit.    Past Medical History  Diagnosis Date  . CAD (coronary artery disease)     a. s/p  MI  in 1988 and CABG 1989;  b. s/p redo CABG 2007;  c. s/p PCI DES to VG->PDA and native Diag;  d. 2010 Cath: occluded VG's to RI and PDA, patent LIMA->LAD, patent stent in diag->med Rx.  . HTN (hypertension)   . Hypercholesteremia   . CKD (chronic kidney disease), stage III   . Ischemic cardiomyopathy     a. 03/2008 EF 30% noted on Myoview.  . Gout   . History of TIAs 2000  . Prostate cancer   . Carotid arterial disease     a. s/p bilat patch angioplasties;  b. 11/5571 RICA 2-20%, LICA 25-42%  . Chronic systolic CHF (congestive heart failure)     a. 03/2008 EF 30% noted on Myoview.    Past Surgical History  Procedure Laterality Date  . Coronary artery bypass graft  7062,3762  . Carotid endarterectomy  1992, 2007    LEFT, RIGHT  . Back surgery    . Radioactive seed implant  2005  . Knee arthroscopy      RIGHT KNEE  . Cardiovascular stress test  04-02-08    EF 30%  . Cholecystectomy N/A 06/22/2013    Procedure: LAPAROSCOPIC CHOLECYSTECTOMY;   Surgeon: Gayland Curry, MD;  Location: [facility] OR;  Service: General;  Laterality: N/A;    Family History  Problem Relation Age of Onset  . Other Father 73    CEREBRAL HEMORRHAGE  . Cancer Mother 2    History   Social History  . Marital Status: Married    Spouse Name: N/A    Number of Children: 3  . Years of Education: N/A   Occupational History  . [employer] safety     retired   Social History Main Topics  . Smoking status: Former Smoker    Types: Cigarettes    Quit date: 09/27/1986  . Smokeless tobacco: Not on file  . Alcohol Use: No  . Drug Use: No  . Sexual Activity:    Other Topics Concern  . Not on file   Social History Narrative   Lives in [place] with wife.  Works at a [employer] two days/wk and does water walking 3 days/wk x 30-45 mins per session.    ROS  as noted in history of present illness.  All other systems were reviewed and are negative.  PHYSICAL EXAM BP 177/72  Pulse 62  Ht 6\' 1"  (1.854 m)  Wt 182 lb (82.555 kg)  BMI 24.02 kg/m2 WDWM in NAD.   The HEENT exam is normal.  The carotids are 2+ with a right carotid bruit. He has bilateral endarterectomy scars.  There is no thyromegaly.  There is no JVD.  The lungs are clear.   There is an old median sternotomy scar.  The heart exam reveals a regular rate with a normal S1 and S2.  There are no murmurs, gallops, or rubs.  The PMI is not displaced.   Abdominal exam reveals good bowel sounds.  There is no guarding or rebound.  There is no hepatosplenomegaly or tenderness.  There are no masses.  Exam of the legs reveal no clubbing, cyanosis, or edema.  The pedal pulses are good.  Cranial nerves II - XII are intact.  Motor and sensory functions are intact.  The gait is normal.  Laboratory data:  Patient's BP readings from home are reviewed and normal ranging from 831-517 systolic.   ASSESSMENT AND PLAN  1. Coronary disease status post redo CABG in 2007. LIMA graft to  the LAD is patent. Other  grafts are occluded. Prior stent of the first diagonal. He has no significant anginal medical therapy. We'll continue with Plavix, isosorbide, amlodipine, or and metoprolol. He is also on a baby aspirin daily. I agree that his symptoms in January do not sound cardiac to me.   2. Hypertension, BP elevated today but well controlled at home on multiple medications including metoprolol, hydralazine, Lotensin, and amlodipine.  3. Hypercholesterolemia continue atorvastatin.  4. Chronic congestive heart failure with ischemic cardiomyopathy. Ejection fraction 35-40%. He is asymptomatic. Continue beta blocker and ACE inhibitor.  5. Carotid arterial disease status post carotid endarterectomy. Carotid Dopplers will be repeated in May.

## 2014-01-21 ENCOUNTER — Other Ambulatory Visit: Payer: Self-pay | Admitting: Cardiology

## 2014-01-25 ENCOUNTER — Ambulatory Visit: Payer: Medicare HMO | Admitting: Cardiology

## 2014-02-04 ENCOUNTER — Other Ambulatory Visit: Payer: Self-pay | Admitting: Cardiology

## 2014-02-20 ENCOUNTER — Ambulatory Visit (HOSPITAL_COMMUNITY): Payer: Medicare HMO | Attending: Cardiovascular Disease | Admitting: *Deleted

## 2014-02-20 DIAGNOSIS — I779 Disorder of arteries and arterioles, unspecified: Secondary | ICD-10-CM

## 2014-02-20 DIAGNOSIS — I6529 Occlusion and stenosis of unspecified carotid artery: Secondary | ICD-10-CM | POA: Insufficient documentation

## 2014-02-20 DIAGNOSIS — I739 Peripheral vascular disease, unspecified: Secondary | ICD-10-CM

## 2014-02-20 NOTE — Progress Notes (Signed)
Carotid duplex complete 

## 2014-02-21 ENCOUNTER — Other Ambulatory Visit: Payer: Self-pay

## 2014-02-21 ENCOUNTER — Encounter: Payer: Self-pay | Admitting: Cardiology

## 2014-02-21 DIAGNOSIS — I739 Peripheral vascular disease, unspecified: Principal | ICD-10-CM

## 2014-02-21 DIAGNOSIS — I779 Disorder of arteries and arterioles, unspecified: Secondary | ICD-10-CM

## 2014-03-13 ENCOUNTER — Other Ambulatory Visit: Payer: Self-pay | Admitting: Cardiology

## 2014-04-25 ENCOUNTER — Other Ambulatory Visit: Payer: Self-pay | Admitting: Internal Medicine

## 2014-04-25 DIAGNOSIS — R1084 Generalized abdominal pain: Secondary | ICD-10-CM

## 2014-04-26 ENCOUNTER — Other Ambulatory Visit: Payer: Self-pay | Admitting: Cardiology

## 2014-05-01 ENCOUNTER — Ambulatory Visit
Admission: RE | Admit: 2014-05-01 | Discharge: 2014-05-01 | Disposition: A | Discharge status: Home / Self Care | Payer: Medicare HMO | Source: Ambulatory Visit | Attending: Internal Medicine | Admitting: Internal Medicine

## 2014-05-01 DIAGNOSIS — R1084 Generalized abdominal pain: Secondary | ICD-10-CM

## 2014-05-01 MED ORDER — IOHEXOL 300 MG/ML  SOLN
125.0000 mL | Freq: Once | INTRAMUSCULAR | Status: AC | PRN
  Administered 2014-05-01: 125 mL via INTRAVENOUS

## 2014-06-14 ENCOUNTER — Other Ambulatory Visit: Payer: Self-pay | Admitting: *Deleted

## 2014-06-14 MED ORDER — ISOSORBIDE MONONITRATE ER 30 MG PO TB24: 30.0000 mg | ORAL_TABLET | Freq: Two times a day (BID) | ORAL | Status: DC

## 2014-06-21 ENCOUNTER — Encounter: Payer: Self-pay | Admitting: Cardiology

## 2014-06-21 ENCOUNTER — Ambulatory Visit (INDEPENDENT_AMBULATORY_CARE_PROVIDER_SITE_OTHER): Payer: Medicare HMO | Admitting: Cardiology

## 2014-06-21 VITALS — BP 166/60 | HR 58 | Ht 73.0 in | Wt 187.4 lb

## 2014-06-21 DIAGNOSIS — I10 Essential (primary) hypertension: Secondary | ICD-10-CM

## 2014-06-21 DIAGNOSIS — I25119 Atherosclerotic heart disease of native coronary artery with unspecified angina pectoris: Secondary | ICD-10-CM

## 2014-06-21 DIAGNOSIS — E7800 Pure hypercholesterolemia, unspecified: Secondary | ICD-10-CM

## 2014-06-21 DIAGNOSIS — I2510 Atherosclerotic heart disease of native coronary artery without angina pectoris: Secondary | ICD-10-CM

## 2014-06-21 DIAGNOSIS — I209 Angina pectoris, unspecified: Secondary | ICD-10-CM

## 2014-06-21 DIAGNOSIS — I519 Heart disease, unspecified: Secondary | ICD-10-CM

## 2014-06-21 NOTE — Progress Notes (Signed)
HPI: [name] [name] is seen today for followup. He has a history of coronary disease and is status post redo coronary bypass surgery in 2007. Cardiac catheterization in 2010 showed occlusion of the vein graft to the intermediate vessel and to the posterior descending artery. He has a patent LIMA graft to the LAD and moderate left ventricular dysfunction. He has prior stenting of the first diagonal branch.  He states he is doing well. Stays active playing golf. No significant angina or dyspnea. Occ. Gout flair. He is on prednisone and methotrexate for RA. Reports BP is doing well at home but is typically highest at this time of day. He had lab work with Dr. Minna Antis last week. Carotid dopplers in May showed stable carotid disease <39% on the right and 39-59% on the left.   No Known Allergies  Current Outpatient Prescriptions on File Prior to Visit  Medication Sig Dispense Refill  . allopurinol (ZYLOPRIM) 300 MG tablet Take 300 mg by mouth daily.      Marland Kitchen amLODipine (NORVASC) 10 MG tablet Take 10 mg by mouth daily.        Marland Kitchen aspirin 81 MG tablet Take 81 mg by mouth daily.        Marland Kitchen atorvastatin (LIPITOR) 40 MG tablet Take 80 mg by mouth daily.       . benazepril (LOTENSIN) 40 MG tablet Take 40 mg by mouth daily.        . clopidogrel (PLAVIX) 75 MG tablet Take 75 mg by mouth daily.      . ergocalciferol (VITAMIN D2) 50000 UNITS capsule Take 50,000 Units by mouth every 30 (thirty) days. Twice monthly 1st and 01UU      . folic acid (FOLVITE) 725 MCG tablet Take 400 mcg by mouth 2 (two) times daily.       Marland Kitchen guaiFENesin-dextromethorphan (ROBITUSSIN DM) 100-10 MG/5ML syrup Take 5 mLs by mouth 4 (four) times daily as needed for cough.  118 mL  0  . hydrALAZINE (APRESOLINE) 25 MG tablet TAKE 1 TABLET BY MOUTH 3 TIMES A DAY  270 tablet  1  . hydrochlorothiazide (HYDRODIURIL) 25 MG tablet Take 25 mg by mouth daily.      . isosorbide mononitrate (IMDUR) 30 MG 24 hr tablet Take 1 tablet (30 mg total) by mouth 2 (two) times  daily.  180 tablet  0  . methotrexate (RHEUMATREX) 2.5 MG tablet Take 2.5 mg by mouth once a week. Caution:Chemotherapy. Protect from light. 10 tablets once a week      . metoprolol tartrate (LOPRESSOR) 25 MG tablet Take 12.5 mg by mouth 2 (two) times daily.        . nitroGLYCERIN (NITROSTAT) 0.4 MG SL tablet Place 1 tablet (0.4 mg total) under the tongue every 5 (five) minutes as needed.  25 tablet  11  . OVER THE COUNTER MEDICATION Take 1 tablet by mouth 2 (two) times daily. lysene       No current facility-administered medications on file prior to visit.    Past Medical History  Diagnosis Date  . CAD (coronary artery disease)     a. s/p  MI in 1988 and CABG 1989;  b. s/p redo CABG 2007;  c. s/p PCI DES to VG->PDA and native Diag;  d. 2010 Cath: occluded VG's to RI and PDA, patent LIMA->LAD, patent stent in diag->med Rx.  . HTN (hypertension)   . Hypercholesteremia   . CKD (chronic kidney disease), stage III   . Ischemic cardiomyopathy  a. 03/2008 EF 30% noted on Myoview.  . Gout   . History of TIAs 2000  . Prostate cancer   . Carotid arterial disease     a. s/p bilat patch angioplasties;  b. 01/1760 RICA 6-07%, LICA 37-10%  . Chronic systolic CHF (congestive heart failure)     a. 03/2008 EF 30% noted on Myoview.  . Rheumatoid arthritis     Past Surgical History  Procedure Laterality Date  . Coronary artery bypass graft  6269,4854  . Carotid endarterectomy  1992, 2007    LEFT, RIGHT  . Back surgery    . Radioactive seed implant  2005  . Knee arthroscopy      RIGHT KNEE  . Cardiovascular stress test  04-02-08    EF 30%  . Cholecystectomy N/A 06/22/2013    Procedure: LAPAROSCOPIC CHOLECYSTECTOMY;  Surgeon: Gayland Curry, MD;  Location: [facility] OR;  Service: General;  Laterality: N/A;    Family History  Problem Relation Age of Onset  . Other Father 25    CEREBRAL HEMORRHAGE  . Cancer Mother 4    History   Social History  . Marital Status: Married    Spouse Name: N/A     Number of Children: 3  . Years of Education: N/A   Occupational History  . Insurance company safety     retired   Social History Main Topics  . Smoking status: Former Smoker    Types: Cigarettes    Quit date: 09/27/1986  . Smokeless tobacco: Not on file  . Alcohol Use: No  . Drug Use: No  . Sexual Activity:    Other Topics Concern  . Not on file   Social History Narrative   Lives in [place] with wife.  Works at a [employer] two days/wk and does water walking 3 days/wk x 30-45 mins per session.    ROS  as noted in history of present illness.  All other systems were reviewed and are negative.  PHYSICAL EXAM BP 166/60  Pulse 58  Ht 6\' 1"  (1.854 m)  Wt 187 lb 7 oz (85.021 kg)  BMI 24.73 kg/m2 WDWM in NAD.   The HEENT exam is normal.  The carotids are 2+ with a right carotid bruit. He has bilateral endarterectomy scars.  There is no thyromegaly.  There is no JVD.  The lungs are clear.   There is an old median sternotomy scar.  The heart exam reveals a regular rate with a normal S1 and S2.  There are no murmurs, gallops, or rubs.  The PMI is not displaced.   Abdominal exam reveals good bowel sounds.  There is no guarding or rebound.  There is no hepatosplenomegaly or tenderness.  There are no masses.  Exam of the legs reveal no clubbing, cyanosis, or edema.  The pedal pulses are good.  Cranial nerves II - XII are intact.  Motor and sensory functions are intact.  The gait is normal.  Laboratory data:  Ecg: NSR , LVH with repol. Changes. Nonspecific ST-T changes. Compared to Ecg in Sept 2014 the HR is slower. No PVCs. Less ST changes.  ASSESSMENT AND PLAN  1. Coronary disease status post redo CABG in 2007. LIMA graft to the LAD is patent. Other grafts are occluded. Prior stent of the first diagonal. He has no significant anginal medical therapy. We'll continue with Plavix, isosorbide, amlodipine, or and metoprolol. He is also on a baby aspirin daily.   2. Hypertension, BP  elevated today  but well controlled at home on multiple medications including metoprolol, hydralazine, Lotensin, and amlodipine.  3. Hypercholesterolemia continue atorvastatin. Will get a copy of recent labs.  4. Chronic congestive heart failure with ischemic cardiomyopathy. Ejection fraction 35-40%. He is asymptomatic. Continue beta blocker and ACE inhibitor.  5. Carotid arterial disease status post carotid endarterectomy. Carotid Dopplers will be repeated in May.

## 2014-06-21 NOTE — Patient Instructions (Signed)
Continue your current therapy  I will get your lab work from Dr. Minna Antis  I will see you in 6 months.

## 2014-06-21 NOTE — Addendum Note (Signed)
Addended by: Golden Hurter D on: 06/21/2014 04:45 PM   Modules accepted: Orders

## 2014-08-09 ENCOUNTER — Other Ambulatory Visit: Payer: Self-pay

## 2014-08-09 MED ORDER — ALLOPURINOL 300 MG PO TABS: 300.0000 mg | ORAL_TABLET | Freq: Every day | ORAL | Status: DC

## 2014-08-09 MED ORDER — ISOSORBIDE MONONITRATE ER 30 MG PO TB24: 30.0000 mg | ORAL_TABLET | Freq: Two times a day (BID) | ORAL | Status: DC

## 2014-10-03 ENCOUNTER — Encounter: Payer: Self-pay | Admitting: Cardiology

## 2014-10-16 ENCOUNTER — Other Ambulatory Visit: Payer: Self-pay | Admitting: *Deleted

## 2014-10-16 MED ORDER — CLOPIDOGREL BISULFATE 75 MG PO TABS: 75.0000 mg | ORAL_TABLET | Freq: Every day | ORAL | Status: DC

## 2014-11-06 ENCOUNTER — Other Ambulatory Visit: Payer: Self-pay | Admitting: Cardiology

## 2014-12-20 ENCOUNTER — Ambulatory Visit (INDEPENDENT_AMBULATORY_CARE_PROVIDER_SITE_OTHER): Payer: Medicare HMO | Admitting: Cardiology

## 2014-12-20 ENCOUNTER — Encounter: Payer: Self-pay | Admitting: Cardiology

## 2014-12-20 VITALS — BP 144/60 | HR 50 | Ht 73.0 in | Wt 184.1 lb

## 2014-12-20 DIAGNOSIS — E7800 Pure hypercholesterolemia, unspecified: Secondary | ICD-10-CM

## 2014-12-20 DIAGNOSIS — I5022 Chronic systolic (congestive) heart failure: Secondary | ICD-10-CM

## 2014-12-20 DIAGNOSIS — I10 Essential (primary) hypertension: Secondary | ICD-10-CM

## 2014-12-20 DIAGNOSIS — I25709 Atherosclerosis of coronary artery bypass graft(s), unspecified, with unspecified angina pectoris: Secondary | ICD-10-CM

## 2014-12-20 MED ORDER — NITROGLYCERIN 0.4 MG SL SUBL: 0.4000 mg | SUBLINGUAL_TABLET | SUBLINGUAL | Status: DC | PRN

## 2014-12-20 NOTE — Patient Instructions (Signed)
Continue your current therapy  We willl follow up your carotid dopplers in May  I will see you in 6 months.

## 2014-12-20 NOTE — Progress Notes (Signed)
HPI: [name] [name] is seen today for followup. He has a history of coronary disease and is status post redo coronary bypass surgery in 2007. Cardiac catheterization in 2010 showed occlusion of the vein graft to the intermediate vessel and to the posterior descending artery. He has a patent LIMA graft to the LAD and moderate left ventricular dysfunction. He has prior stenting of the first diagonal branch.  He states he is doing well. Stays active playing golf. No significant angina or dyspnea. He has some increase in arthritis symptoms.  He is on low dose prednisone for 2 weeks and methotrexate for RA with improvement. Reports BP at other doctor visits is 573-220 systolic.  No TIA or CVA symptoms.    No Known Allergies  Current Outpatient Prescriptions on File Prior to Visit  Medication Sig Dispense Refill  . allopurinol (ZYLOPRIM) 300 MG tablet Take 1 tablet (300 mg total) by mouth daily. 30 tablet 6  . amLODipine (NORVASC) 10 MG tablet Take 10 mg by mouth daily.      Marland Kitchen aspirin 81 MG tablet Take 81 mg by mouth daily.      Marland Kitchen atorvastatin (LIPITOR) 40 MG tablet Take 80 mg by mouth daily.     . benazepril (LOTENSIN) 40 MG tablet Take 40 mg by mouth daily.      . clopidogrel (PLAVIX) 75 MG tablet Take 1 tablet (75 mg total) by mouth daily. 90 tablet 2  . ergocalciferol (VITAMIN D2) 50000 UNITS capsule Take 50,000 Units by mouth every 30 (thirty) days. Twice monthly 1st and 15th    . hydrALAZINE (APRESOLINE) 25 MG tablet TAKE 1 TABLET BY MOUTH THREE TIMES DAILY 270 tablet 3  . hydrochlorothiazide (HYDRODIURIL) 25 MG tablet Take 25 mg by mouth daily.    . isosorbide mononitrate (IMDUR) 30 MG 24 hr tablet Take 1 tablet (30 mg total) by mouth 2 (two) times daily. 180 tablet 6  . methotrexate (RHEUMATREX) 2.5 MG tablet Take 2.5 mg by mouth once a week. Caution:Chemotherapy. Protect from light. 10 tablets once a week    . metoprolol tartrate (LOPRESSOR) 25 MG tablet Take 12.5 mg by mouth 2 (two) times daily.      Marland Kitchen  OVER THE COUNTER MEDICATION Take 1 tablet by mouth 2 (two) times daily. lysene    . predniSONE (DELTASONE) 5 MG tablet Take 5 mg by mouth daily with breakfast.     No current facility-administered medications on file prior to visit.    Past Medical History  Diagnosis Date  . CAD (coronary artery disease)     a. s/p  MI in 1988 and CABG 1989;  b. s/p redo CABG 2007;  c. s/p PCI DES to VG->PDA and native Diag;  d. 2010 Cath: occluded VG's to RI and PDA, patent LIMA->LAD, patent stent in diag->med Rx.  . HTN (hypertension)   . Hypercholesteremia   . CKD (chronic kidney disease), stage III   . Ischemic cardiomyopathy     a. 03/2008 EF 30% noted on Myoview.  . Gout   . History of TIAs 2000  . Prostate cancer   . Carotid arterial disease     a. s/p bilat patch angioplasties;  b. 10/5425 RICA 0-62%, LICA 37-62%  . Chronic systolic CHF (congestive heart failure)     a. 03/2008 EF 30% noted on Myoview.  . Rheumatoid arthritis     Past Surgical History  Procedure Laterality Date  . Coronary artery bypass graft  8315,1761  . Carotid endarterectomy  1992, 2007  LEFT, RIGHT  . Back surgery    . Radioactive seed implant  2005  . Knee arthroscopy      RIGHT KNEE  . Cardiovascular stress test  04-02-08    EF 30%  . Cholecystectomy N/A 06/22/2013    Procedure: LAPAROSCOPIC CHOLECYSTECTOMY;  Surgeon: Gayland Curry, MD;  Location: [facility] OR;  Service: General;  Laterality: N/A;    Family History  Problem Relation Age of Onset  . Other Father 55    CEREBRAL HEMORRHAGE  . Cancer Mother 82     History   Social History  . Marital Status: Married    Spouse Name: N/A  . Number of Children: 3  . Years of Education: N/A   Occupational History  . Insurance company safety     retired   Social History Main Topics  . Smoking status: Former Smoker    Types: Cigarettes    Quit date: 09/27/1986  . Smokeless tobacco: Not on file  . Alcohol Use: No  . Drug Use: No  . Sexual Activity: Not on  file   Other Topics Concern  . Not on file   Social History Narrative   Lives in [place] with wife.  Works at a [employer] two days/wk and does water walking 3 days/wk x 30-45 mins per session.    ROS  as noted in history of present illness.  All other systems were reviewed and are negative.  PHYSICAL EXAM BP 144/60 mmHg  Pulse 50  Ht 6\' 1"  (1.854 m)  Wt 184 lb 1.6 oz (83.507 kg)  BMI 24.29 kg/m2 WDWM in NAD.   The HEENT exam is normal.  The carotids are 2+ with a bilateral carotid bruits. He has bilateral endarterectomy scars.  There is no thyromegaly.  There is no JVD.  The lungs are clear.   There is an old median sternotomy scar.  The heart exam reveals a regular rate with a normal S1 and S2.  There are no murmurs, gallops, or rubs.  The PMI is not displaced.   Abdominal exam reveals good bowel sounds.  There is no guarding or rebound.  There is no hepatosplenomegaly or tenderness.  There are no masses.  Exam of the legs reveal no clubbing, cyanosis, or edema.  The pedal pulses are good.  Cranial nerves II - XII are intact.  Motor and sensory functions are intact.  The gait is normal.  Laboratory data:  Labs reviewed from Dr. Minna Antis in December. A1c 6.3%. Lipids are excellent.   ASSESSMENT AND PLAN  1. Coronary disease status post redo CABG in 2007. LIMA graft to the LAD is patent. Other grafts are occluded. Prior stent of the first diagonal. He has no significant anginal medical therapy. We'll continue with Plavix, isosorbide, amlodipine, or and metoprolol. He is also on a baby aspirin daily.   2. Hypertension, BP well controlled. Continue Rx.   3. Hypercholesterolemia continue atorvastatin. Excellent control.   4. Chronic congestive heart failure with ischemic cardiomyopathy. Ejection fraction 40-45%. He is asymptomatic. Continue beta blocker and ACE inhibitor.  5. Carotid arterial disease status post carotid endarterectomy. Carotid Dopplers will be repeated in  May.

## 2015-01-31 ENCOUNTER — Other Ambulatory Visit: Payer: Self-pay | Admitting: Cardiology

## 2015-02-18 ENCOUNTER — Encounter: Payer: Self-pay | Admitting: Cardiology

## 2015-03-18 ENCOUNTER — Other Ambulatory Visit (HOSPITAL_COMMUNITY): Payer: Self-pay | Admitting: *Deleted

## 2015-03-18 ENCOUNTER — Other Ambulatory Visit: Payer: Self-pay | Admitting: Cardiology

## 2015-03-18 DIAGNOSIS — I6523 Occlusion and stenosis of bilateral carotid arteries: Secondary | ICD-10-CM

## 2015-03-21 ENCOUNTER — Ambulatory Visit (HOSPITAL_COMMUNITY): Payer: Medicare HMO | Attending: Cardiovascular Disease

## 2015-03-21 DIAGNOSIS — I2510 Atherosclerotic heart disease of native coronary artery without angina pectoris: Secondary | ICD-10-CM | POA: Insufficient documentation

## 2015-03-21 DIAGNOSIS — Z8673 Personal history of transient ischemic attack (TIA), and cerebral infarction without residual deficits: Secondary | ICD-10-CM | POA: Insufficient documentation

## 2015-03-21 DIAGNOSIS — Z951 Presence of aortocoronary bypass graft: Secondary | ICD-10-CM | POA: Insufficient documentation

## 2015-03-21 DIAGNOSIS — I6523 Occlusion and stenosis of bilateral carotid arteries: Secondary | ICD-10-CM | POA: Diagnosis not present

## 2015-03-21 DIAGNOSIS — Z87891 Personal history of nicotine dependence: Secondary | ICD-10-CM | POA: Diagnosis not present

## 2015-03-21 DIAGNOSIS — I10 Essential (primary) hypertension: Secondary | ICD-10-CM | POA: Diagnosis not present

## 2015-03-26 ENCOUNTER — Telehealth: Payer: Self-pay | Admitting: Cardiology

## 2015-03-26 NOTE — Telephone Encounter (Signed)
Pt is returning Cheryl's. He thinks that it is in regards to his Carotid that was done on 6/24. Please call   Thanks

## 2015-03-27 NOTE — Telephone Encounter (Signed)
Returned call to patient spoke to wife carotid doppler results given.

## 2015-03-27 NOTE — Telephone Encounter (Signed)
Returning [place] phone call. Please call   Thanks

## 2015-05-22 ENCOUNTER — Encounter: Payer: Self-pay | Admitting: Cardiology

## 2015-06-27 ENCOUNTER — Encounter: Payer: Self-pay | Admitting: Cardiology

## 2015-06-27 ENCOUNTER — Ambulatory Visit (INDEPENDENT_AMBULATORY_CARE_PROVIDER_SITE_OTHER): Payer: Medicare HMO | Admitting: Cardiology

## 2015-06-27 VITALS — BP 144/62 | HR 48 | Ht 73.0 in | Wt 185.2 lb

## 2015-06-27 DIAGNOSIS — I779 Disorder of arteries and arterioles, unspecified: Secondary | ICD-10-CM

## 2015-06-27 DIAGNOSIS — I739 Peripheral vascular disease, unspecified: Secondary | ICD-10-CM

## 2015-06-27 DIAGNOSIS — I25709 Atherosclerosis of coronary artery bypass graft(s), unspecified, with unspecified angina pectoris: Secondary | ICD-10-CM | POA: Diagnosis not present

## 2015-06-27 DIAGNOSIS — I10 Essential (primary) hypertension: Secondary | ICD-10-CM | POA: Diagnosis not present

## 2015-06-27 DIAGNOSIS — E7800 Pure hypercholesterolemia, unspecified: Secondary | ICD-10-CM

## 2015-06-27 DIAGNOSIS — I519 Heart disease, unspecified: Secondary | ICD-10-CM | POA: Diagnosis not present

## 2015-06-27 NOTE — Patient Instructions (Signed)
Continue your current therapy  I will see you in 6-8 months. 

## 2015-06-27 NOTE — Progress Notes (Signed)
HPI: [name] [name] is seen today for followup. He has a history of coronary disease and is status post redo coronary bypass surgery in 2007. Cardiac catheterization in 2010 showed occlusion of the vein graft to the intermediate vessel and to the posterior descending artery. He has a patent LIMA graft to the LAD and moderate left ventricular dysfunction. He has prior stenting of the first diagonal branch.  On follow up today he states he is doing well. Stays active playing golf. [facility] [employer] [facility]. No significant angina or dyspnea. His arthritis symptoms are under control. Reports BP has been well controlled. No TIA or CVA symptoms.    No Known Allergies  Current Outpatient Prescriptions on File Prior to Visit  Medication Sig Dispense Refill  . allopurinol (ZYLOPRIM) 300 MG tablet TAKE 1 TABLET BY MOUTH EVERY DAY 90 tablet 1  . amLODipine (NORVASC) 10 MG tablet Take 10 mg by mouth daily.      Marland Kitchen aspirin 81 MG tablet Take 81 mg by mouth daily.      Marland Kitchen atorvastatin (LIPITOR) 40 MG tablet Take 80 mg by mouth daily.     . benazepril (LOTENSIN) 40 MG tablet Take 40 mg by mouth daily.      . clopidogrel (PLAVIX) 75 MG tablet Take 1 tablet (75 mg total) by mouth daily. 90 tablet 2  . ergocalciferol (VITAMIN D2) 50000 UNITS capsule Take 50,000 Units by mouth every 30 (thirty) days. Twice monthly 1st and 15th    . hydrALAZINE (APRESOLINE) 25 MG tablet TAKE 1 TABLET BY MOUTH THREE TIMES DAILY 270 tablet 3  . hydrochlorothiazide (HYDRODIURIL) 25 MG tablet Take 25 mg by mouth daily.    . isosorbide mononitrate (IMDUR) 30 MG 24 hr tablet Take 1 tablet (30 mg total) by mouth 2 (two) times daily. 180 tablet 6  . metFORMIN (GLUCOPHAGE) 500 MG tablet Take 1 tablet by mouth every evening.  4  . methotrexate (RHEUMATREX) 2.5 MG tablet Take 2.5 mg by mouth once a week. Caution:Chemotherapy. Protect from light. 10 tablets once a week    . metoprolol tartrate (LOPRESSOR) 25 MG tablet Take 12.5 mg by mouth 2 (two)  times daily.      . nitroGLYCERIN (NITROSTAT) 0.4 MG SL tablet Place 1 tablet (0.4 mg total) under the tongue every 5 (five) minutes as needed. 25 tablet 3  . OVER THE COUNTER MEDICATION Take 1 tablet by mouth 2 (two) times daily. lysene    . predniSONE (DELTASONE) 5 MG tablet Take 5 mg by mouth daily with breakfast.     No current facility-administered medications on file prior to visit.    Past Medical History  Diagnosis Date  . CAD (coronary artery disease)     a. s/p  MI in 1988 and CABG 1989;  b. s/p redo CABG 2007;  c. s/p PCI DES to VG->PDA and native Diag;  d. 2010 Cath: occluded VG's to RI and PDA, patent LIMA->LAD, patent stent in diag->med Rx.  . HTN (hypertension)   . Hypercholesteremia   . CKD (chronic kidney disease), stage III   . Ischemic cardiomyopathy     a. 03/2008 EF 30% noted on Myoview.  . Gout   . History of TIAs 2000  . Prostate cancer   . Carotid arterial disease     a. s/p bilat patch angioplasties;  b. 0/1093 RICA 2-35%, LICA 57-32%  . Chronic systolic CHF (congestive heart failure)     a. 03/2008 EF 30% noted on  Myoview.  . Rheumatoid arthritis     Past Surgical History  Procedure Laterality Date  . Coronary artery bypass graft  4098,1191  . Carotid endarterectomy  1992, 2007    LEFT, RIGHT  . Back surgery    . Radioactive seed implant  2005  . Knee arthroscopy      RIGHT KNEE  . Cardiovascular stress test  04-02-08    EF 30%  . Cholecystectomy N/A 06/22/2013    Procedure: LAPAROSCOPIC CHOLECYSTECTOMY;  Surgeon: Gayland Curry, MD;  Location: [facility] OR;  Service: General;  Laterality: N/A;    Family History  Problem Relation Age of Onset  . Other Father 68    CEREBRAL HEMORRHAGE  . Cancer Mother 41     Social History   Social History  . Marital Status: Married    Spouse Name: N/A  . Number of Children: 3  . Years of Education: N/A   Occupational History  . Insurance company safety     retired   Social History Main Topics  . Smoking  status: Former Smoker    Types: Cigarettes    Quit date: 09/27/1986  . Smokeless tobacco: Not on file  . Alcohol Use: No  . Drug Use: No  . Sexual Activity: Not on file   Other Topics Concern  . Not on file   Social History Narrative   Lives in [place] with wife.  Works at a [employer] two days/wk and does water walking 3 days/wk x 30-45 mins per session.    ROS As noted in history of present illness.  All other systems were reviewed and are negative.  PHYSICAL EXAM BP 144/62 mmHg  Pulse 48  Ht 6\' 1"  (1.854 m)  Wt 84.006 kg (185 lb 3.2 oz)  BMI 24.44 kg/m2 WDWM in NAD.   The HEENT exam is normal.  The carotids are 2+ with a bilateral carotid bruits. He has bilateral endarterectomy scars.  There is no thyromegaly.  There is no JVD.  The lungs are clear.   There is an old median sternotomy scar.  The heart exam reveals a regular rate with a normal S1 and S2.  There are no murmurs, gallops, or rubs.  The PMI is not displaced.   Abdominal exam reveals good bowel sounds.  There is no guarding or rebound.  There is no hepatosplenomegaly or tenderness.  There are no masses.  Exam of the legs reveal no clubbing, cyanosis, or edema.  The pedal pulses are good.  Cranial nerves II - XII are intact.  Motor and sensory functions are intact.  The gait is normal.  Laboratory data:  Labs reviewed from Dr. Maudie Mercury in August. A1c 6.4%. Lipids are excellent. LDL 60. Total cholesterol 136. Creatinine 1.2.  Ecg today shows NSR with PVCs. LVH with QRS widening and repolarization abnormality. Old septal infarct. I have personally reviewed and interpreted this study.  Carotid dopplers 03/21/15 shows patent RCEA. 40-59% on left.   ASSESSMENT AND PLAN  1. Coronary disease status post redo CABG in 2007. LIMA graft to the LAD is patent. Other grafts are occluded. Prior stent of the first diagonal. He has no significant anginal medical therapy. We'll continue with Plavix, isosorbide, amlodipine, or and  metoprolol. He is also on a baby aspirin daily.   2. Hypertension, BP well controlled. Continue Rx.   3. Hypercholesterolemia continue atorvastatin. Excellent control.   4. Chronic congestive heart failure with ischemic cardiomyopathy. Ejection fraction 40-45%. He is asymptomatic. Continue beta  blocker and ACE inhibitor.  5. Carotid arterial disease status post carotid endarterectomy. Carotid Dopplers are stable. Follow yearly.

## 2015-07-25 ENCOUNTER — Other Ambulatory Visit: Payer: Self-pay | Admitting: Cardiology

## 2015-07-25 NOTE — Telephone Encounter (Signed)
Ok to refill this continuously? Please advise. Thanks, MI

## 2015-08-02 ENCOUNTER — Other Ambulatory Visit: Payer: Self-pay | Admitting: Cardiology

## 2015-08-29 ENCOUNTER — Other Ambulatory Visit: Payer: Self-pay | Admitting: *Deleted

## 2015-08-29 MED ORDER — ISOSORBIDE MONONITRATE ER 30 MG PO TB24: 30.0000 mg | ORAL_TABLET | Freq: Two times a day (BID) | ORAL | Status: DC

## 2015-10-01 DIAGNOSIS — I209 Angina pectoris, unspecified: Secondary | ICD-10-CM | POA: Diagnosis not present

## 2015-10-01 DIAGNOSIS — E1122 Type 2 diabetes mellitus with diabetic chronic kidney disease: Secondary | ICD-10-CM | POA: Diagnosis not present

## 2015-10-01 DIAGNOSIS — F17211 Nicotine dependence, cigarettes, in remission: Secondary | ICD-10-CM | POA: Diagnosis not present

## 2015-10-01 DIAGNOSIS — I129 Hypertensive chronic kidney disease with stage 1 through stage 4 chronic kidney disease, or unspecified chronic kidney disease: Secondary | ICD-10-CM | POA: Diagnosis not present

## 2015-10-01 DIAGNOSIS — N182 Chronic kidney disease, stage 2 (mild): Secondary | ICD-10-CM | POA: Diagnosis not present

## 2015-10-31 ENCOUNTER — Other Ambulatory Visit: Payer: Self-pay | Admitting: Cardiology

## 2015-10-31 NOTE — Telephone Encounter (Signed)
Rx request sent to pharmacy.  

## 2015-11-05 DIAGNOSIS — Z79899 Other long term (current) drug therapy: Secondary | ICD-10-CM | POA: Diagnosis not present

## 2015-11-05 DIAGNOSIS — M1A09X0 Idiopathic chronic gout, multiple sites, without tophus (tophi): Secondary | ICD-10-CM | POA: Diagnosis not present

## 2015-11-05 DIAGNOSIS — M0589 Other rheumatoid arthritis with rheumatoid factor of multiple sites: Secondary | ICD-10-CM | POA: Diagnosis not present

## 2015-12-16 ENCOUNTER — Other Ambulatory Visit: Payer: Self-pay | Admitting: *Deleted

## 2015-12-16 MED ORDER — CLOPIDOGREL BISULFATE 75 MG PO TABS: 75.0000 mg | ORAL_TABLET | Freq: Every day | ORAL | Status: DC

## 2015-12-24 DIAGNOSIS — Z85828 Personal history of other malignant neoplasm of skin: Secondary | ICD-10-CM | POA: Diagnosis not present

## 2015-12-24 DIAGNOSIS — L218 Other seborrheic dermatitis: Secondary | ICD-10-CM | POA: Diagnosis not present

## 2015-12-24 DIAGNOSIS — C44722 Squamous cell carcinoma of skin of right lower limb, including hip: Secondary | ICD-10-CM | POA: Diagnosis not present

## 2015-12-24 DIAGNOSIS — D0471 Carcinoma in situ of skin of right lower limb, including hip: Secondary | ICD-10-CM | POA: Diagnosis not present

## 2015-12-24 DIAGNOSIS — D225 Melanocytic nevi of trunk: Secondary | ICD-10-CM | POA: Diagnosis not present

## 2015-12-24 DIAGNOSIS — C44719 Basal cell carcinoma of skin of left lower limb, including hip: Secondary | ICD-10-CM | POA: Diagnosis not present

## 2015-12-24 DIAGNOSIS — D485 Neoplasm of uncertain behavior of skin: Secondary | ICD-10-CM | POA: Diagnosis not present

## 2015-12-24 DIAGNOSIS — L821 Other seborrheic keratosis: Secondary | ICD-10-CM | POA: Diagnosis not present

## 2015-12-24 DIAGNOSIS — D1801 Hemangioma of skin and subcutaneous tissue: Secondary | ICD-10-CM | POA: Diagnosis not present

## 2015-12-24 DIAGNOSIS — L570 Actinic keratosis: Secondary | ICD-10-CM | POA: Diagnosis not present

## 2015-12-31 DIAGNOSIS — H35372 Puckering of macula, left eye: Secondary | ICD-10-CM | POA: Diagnosis not present

## 2015-12-31 DIAGNOSIS — E119 Type 2 diabetes mellitus without complications: Secondary | ICD-10-CM | POA: Diagnosis not present

## 2015-12-31 DIAGNOSIS — H2511 Age-related nuclear cataract, right eye: Secondary | ICD-10-CM | POA: Diagnosis not present

## 2015-12-31 DIAGNOSIS — H35352 Cystoid macular degeneration, left eye: Secondary | ICD-10-CM | POA: Diagnosis not present

## 2016-01-21 DIAGNOSIS — Z8546 Personal history of malignant neoplasm of prostate: Secondary | ICD-10-CM | POA: Diagnosis not present

## 2016-01-26 DIAGNOSIS — R05 Cough: Secondary | ICD-10-CM | POA: Diagnosis not present

## 2016-01-26 DIAGNOSIS — J069 Acute upper respiratory infection, unspecified: Secondary | ICD-10-CM | POA: Diagnosis not present

## 2016-01-28 DIAGNOSIS — R3121 Asymptomatic microscopic hematuria: Secondary | ICD-10-CM | POA: Diagnosis not present

## 2016-01-28 DIAGNOSIS — N393 Stress incontinence (female) (male): Secondary | ICD-10-CM | POA: Diagnosis not present

## 2016-01-28 DIAGNOSIS — Z8546 Personal history of malignant neoplasm of prostate: Secondary | ICD-10-CM | POA: Diagnosis not present

## 2016-01-28 DIAGNOSIS — N5201 Erectile dysfunction due to arterial insufficiency: Secondary | ICD-10-CM | POA: Diagnosis not present

## 2016-01-28 DIAGNOSIS — Z0000 Encounter for general adult medical examination without abnormal findings: Secondary | ICD-10-CM | POA: Diagnosis not present

## 2016-01-30 DIAGNOSIS — R5383 Other fatigue: Secondary | ICD-10-CM | POA: Diagnosis not present

## 2016-01-30 DIAGNOSIS — I2510 Atherosclerotic heart disease of native coronary artery without angina pectoris: Secondary | ICD-10-CM | POA: Diagnosis not present

## 2016-01-30 DIAGNOSIS — I129 Hypertensive chronic kidney disease with stage 1 through stage 4 chronic kidney disease, or unspecified chronic kidney disease: Secondary | ICD-10-CM | POA: Diagnosis not present

## 2016-01-30 DIAGNOSIS — M109 Gout, unspecified: Secondary | ICD-10-CM | POA: Diagnosis not present

## 2016-01-30 DIAGNOSIS — Z125 Encounter for screening for malignant neoplasm of prostate: Secondary | ICD-10-CM | POA: Diagnosis not present

## 2016-01-30 DIAGNOSIS — E559 Vitamin D deficiency, unspecified: Secondary | ICD-10-CM | POA: Diagnosis not present

## 2016-01-30 DIAGNOSIS — E1122 Type 2 diabetes mellitus with diabetic chronic kidney disease: Secondary | ICD-10-CM | POA: Diagnosis not present

## 2016-02-04 ENCOUNTER — Other Ambulatory Visit: Payer: Self-pay | Admitting: *Deleted

## 2016-02-04 ENCOUNTER — Telehealth (HOSPITAL_COMMUNITY): Payer: Self-pay

## 2016-02-04 DIAGNOSIS — R5383 Other fatigue: Secondary | ICD-10-CM

## 2016-02-04 NOTE — Telephone Encounter (Signed)
Encounter complete. 

## 2016-02-05 ENCOUNTER — Telehealth (HOSPITAL_COMMUNITY): Payer: Self-pay

## 2016-02-05 ENCOUNTER — Inpatient Hospital Stay (HOSPITAL_COMMUNITY): Admission: RE | Admit: 2016-02-05 | Payer: Medicare HMO | Source: Ambulatory Visit

## 2016-02-05 NOTE — Telephone Encounter (Signed)
Encounter complete. 

## 2016-02-06 ENCOUNTER — Encounter (HOSPITAL_COMMUNITY): Payer: Self-pay | Admitting: *Deleted

## 2016-02-06 ENCOUNTER — Ambulatory Visit (HOSPITAL_COMMUNITY)
Admission: RE | Admit: 2016-02-06 | Discharge: 2016-02-06 | Disposition: A | Discharge status: Home / Self Care | Payer: PPO | Source: Ambulatory Visit | Attending: Internal Medicine | Admitting: Internal Medicine

## 2016-02-06 DIAGNOSIS — R5383 Other fatigue: Secondary | ICD-10-CM | POA: Diagnosis not present

## 2016-02-06 DIAGNOSIS — R9439 Abnormal result of other cardiovascular function study: Secondary | ICD-10-CM | POA: Insufficient documentation

## 2016-02-06 LAB — EXERCISE TOLERANCE TEST
CHL CUP MPHR: 139 {beats}/min
CHL CUP RESTING HR STRESS: 62 {beats}/min
CHL CUP STRESS STAGE 1 DBP: 65 mmHg
CHL CUP STRESS STAGE 1 SBP: 128 mmHg
CHL CUP STRESS STAGE 2 HR: 68 {beats}/min
CHL CUP STRESS STAGE 3 GRADE: 0.1 %
CHL CUP STRESS STAGE 3 HR: 68 {beats}/min
CHL CUP STRESS STAGE 3 SPEED: 1 mph
CHL CUP STRESS STAGE 4 DBP: 58 mmHg
CHL CUP STRESS STAGE 4 HR: 100 {beats}/min
CHL CUP STRESS STAGE 4 SBP: 148 mmHg
CHL CUP STRESS STAGE 5 DBP: 60 mmHg
CHL CUP STRESS STAGE 5 GRADE: 12 %
CHL CUP STRESS STAGE 5 SBP: 128 mmHg
CHL CUP STRESS STAGE 5 SPEED: 2.5 mph
CHL CUP STRESS STAGE 6 HR: 108 {beats}/min
CHL CUP STRESS STAGE 7 DBP: 56 mmHg
CHL RATE OF PERCEIVED EXERTION: 16
CSEPHR: 87 %
CSEPPBP: 128 mmHg
CSEPPMHR: 87 %
Estimated workload: 7 METS
Exercise duration (min): 5 min
Exercise duration (sec): 0 s
Peak HR: 122 {beats}/min
Stage 1 Grade: 0 %
Stage 1 HR: 67 {beats}/min
Stage 1 Speed: 0 mph
Stage 2 Grade: 0 %
Stage 2 Speed: 0.8 mph
Stage 4 Grade: 10 %
Stage 4 Speed: 1.7 mph
Stage 5 HR: 122 {beats}/min
Stage 6 DBP: 56 mmHg
Stage 6 Grade: 0 %
Stage 6 SBP: 141 mmHg
Stage 6 Speed: 0 mph
Stage 7 Grade: 0 %
Stage 7 HR: 66 {beats}/min
Stage 7 SBP: 119 mmHg
Stage 7 Speed: 0 mph

## 2016-02-06 NOTE — Progress Notes (Unsigned)
Patient had abnormal ETT. Test was reviewed by Dr. Debara Pickett and he gave the ok to discharge patient to go home. Patient will follow-up with Dr. [place].

## 2016-02-12 ENCOUNTER — Encounter: Payer: Self-pay | Admitting: Cardiology

## 2016-02-12 ENCOUNTER — Ambulatory Visit (INDEPENDENT_AMBULATORY_CARE_PROVIDER_SITE_OTHER): Payer: PPO | Admitting: Cardiology

## 2016-02-12 VITALS — BP 156/54 | HR 57 | Ht 73.0 in | Wt 183.0 lb

## 2016-02-12 DIAGNOSIS — I10 Essential (primary) hypertension: Secondary | ICD-10-CM | POA: Diagnosis not present

## 2016-02-12 DIAGNOSIS — I5022 Chronic systolic (congestive) heart failure: Secondary | ICD-10-CM

## 2016-02-12 DIAGNOSIS — E7800 Pure hypercholesterolemia, unspecified: Secondary | ICD-10-CM

## 2016-02-12 DIAGNOSIS — I25118 Atherosclerotic heart disease of native coronary artery with other forms of angina pectoris: Secondary | ICD-10-CM

## 2016-02-12 DIAGNOSIS — I519 Heart disease, unspecified: Secondary | ICD-10-CM

## 2016-02-12 NOTE — Patient Instructions (Signed)
We will schedule you for dopplers to check your leg circulation  Continue your current therapy  I will see you in 3 months.

## 2016-02-12 NOTE — Progress Notes (Signed)
HPI: [name] [name] is seen today for followup. He has a history of coronary disease and is status post redo coronary bypass surgery in 2007. In 2008 he was found to have occlusion of SVG to the intermediate branch and severe degeneration in SVG to RCA and distal LCx. The SVG was stented as was the native diagonal.  Cardiac catheterization in 2010 showed occlusion of the vein graft to the intermediate vessel and to the posterior descending artery. The stent in the diagonal was patent. The native LCx, intermediate, and RCA were occluded. He had a patent LIMA graft to the LAD and moderate left ventricular dysfunction. He has been treated medically. Recently he was seen by Dr. Noah Delaine and complained of decreased energy. He particularly notes his legs tire out easily. He has to stop after doing 20 minutes of yard work. After resting 5 minutes he is able to resume. He does complain of hip pain worse with exertion. He denies any chest pin or SOB and still enjoys playing golf. His wife notes he sleeps more during the day. He was scheduled for an ETT which was done on 02/06/16. He walked 5 minutes with adequate HR and BP response. He had no chest pain. Ecg showed 3 mm ST depression in the inferolateral leads.    No Known Allergies  Current Outpatient Prescriptions on File Prior to Visit  Medication Sig Dispense Refill  . allopurinol (ZYLOPRIM) 300 MG tablet TAKE 1 TABLET BY MOUTH EVERY DAY 90 tablet 1  . amLODipine (NORVASC) 10 MG tablet Take 10 mg by mouth daily.      Marland Kitchen aspirin 81 MG tablet Take 81 mg by mouth daily.      Marland Kitchen atorvastatin (LIPITOR) 40 MG tablet Take 80 mg by mouth daily.     . benazepril (LOTENSIN) 40 MG tablet Take 40 mg by mouth daily.      . clopidogrel (PLAVIX) 75 MG tablet Take 1 tablet (75 mg total) by mouth daily. NEED OV. 90 tablet 0  . ergocalciferol (VITAMIN D2) 50000 UNITS capsule Take 50,000 Units by mouth every 30 (thirty) days. Twice monthly 1st and 15th    . hydrALAZINE (APRESOLINE) 25  MG tablet TAKE 1 TABLET BY MOUTH THREE TIMES DAILY 270 tablet 2  . hydrochlorothiazide (HYDRODIURIL) 25 MG tablet Take 25 mg by mouth daily.    . isosorbide mononitrate (IMDUR) 30 MG 24 hr tablet Take 1 tablet (30 mg total) by mouth 2 (two) times daily. 180 tablet 3  . metFORMIN (GLUCOPHAGE) 500 MG tablet Take 1 tablet by mouth every evening.  4  . methotrexate (RHEUMATREX) 2.5 MG tablet Take 2.5 mg by mouth once a week. Caution:Chemotherapy. Protect from light. 10 tablets once a week    . metoprolol tartrate (LOPRESSOR) 25 MG tablet Take 12.5 mg by mouth 2 (two) times daily.      . nitroGLYCERIN (NITROSTAT) 0.4 MG SL tablet Place 1 tablet (0.4 mg total) under the tongue every 5 (five) minutes as needed. 25 tablet 3  . OVER THE COUNTER MEDICATION Take 1 tablet by mouth 2 (two) times daily. lysene    . predniSONE (DELTASONE) 5 MG tablet Take 5 mg by mouth daily with breakfast.     No current facility-administered medications on file prior to visit.    Past Medical History  Diagnosis Date  . CAD (coronary artery disease)     a. s/p  MI in 1988 and CABG 1989;  b. s/p redo CABG 2007;  c. s/p PCI DES to  VG->PDA and native Diag;  d. 2010 Cath: occluded VG's to RI and PDA, patent LIMA->LAD, patent stent in diag->med Rx.  . HTN (hypertension)   . Hypercholesteremia   . CKD (chronic kidney disease), stage III   . Ischemic cardiomyopathy     a. 03/2008 EF 30% noted on Myoview.  . Gout   . History of TIAs 2000  . Prostate cancer ([place])   . Carotid arterial disease ([place])     a. s/p bilat patch angioplasties;  b. 0000000 RICA [id number], LICA 123456  . Chronic systolic CHF (congestive heart failure) ([place])     a. 03/2008 EF 30% noted on Myoview.  . Rheumatoid arthritis [facility])     Past Surgical History  Procedure Laterality Date  . Coronary artery bypass graft  [id number]  . Carotid endarterectomy  1992, 2007    LEFT, RIGHT  . Back surgery    . Radioactive seed implant  2005  . Knee arthroscopy       RIGHT KNEE  . Cardiovascular stress test  04-02-08    EF 30%  . Cholecystectomy N/A 06/22/2013    Procedure: LAPAROSCOPIC CHOLECYSTECTOMY;  Surgeon: Gayland Curry, MD;  Location: [facility] OR;  Service: General;  Laterality: N/A;    Family History  Problem Relation Age of Onset  . Other Father 60    CEREBRAL HEMORRHAGE  . Cancer Mother 64     Social History   Social History  . Marital Status: Married    Spouse Name: N/A  . Number of Children: 3  . Years of Education: N/A   Occupational History  . Insurance company safety     retired   Social History Main Topics  . Smoking status: Former Smoker    Types: Cigarettes    Quit date: 09/27/1986  . Smokeless tobacco: Not on file  . Alcohol Use: No  . Drug Use: No  . Sexual Activity: Not on file   Other Topics Concern  . Not on file   Social History Narrative   Lives in [place] with wife.  Works at a [employer] two days/wk and does water walking 3 days/wk x 30-45 mins per session.    ROS As noted in history of present illness.  All other systems were reviewed and are negative.  PHYSICAL EXAM BP 156/54 mmHg  Pulse 57  Ht 6\' 1"  (1.854 m)  Wt 83.008 kg (183 lb)  BMI 24.15 kg/m2 WDWM in NAD.   The HEENT exam is normal.  The carotids are 2+ with a bilateral carotid bruits. He has bilateral endarterectomy scars.  There is no thyromegaly.  There is no JVD.  The lungs are clear.   There is an old median sternotomy scar.  The heart exam reveals a regular rate with a normal S1 and S2.  There are no murmurs, gallops, or rubs.  The PMI is not displaced.   Abdominal exam reveals good bowel sounds.  There is no guarding or rebound.  There is no hepatosplenomegaly or tenderness.  There are no masses.  Exam of the legs reveal no clubbing, cyanosis, or edema.  The pedal pulses are palpable. He does have a right femoral bruit.  Cranial nerves II - XII are intact.  Motor and sensory functions are intact.  The gait is normal.  Laboratory data:    Ecg today shows NSR with PVCs. Rate 57. First degree AV block. LAE.  Old anterior infarct. No change from prior.  I have personally reviewed and  interpreted this study.  Carotid dopplers 03/21/15 shows patent RCEA. 40-59% on left.   ASSESSMENT AND PLAN  1. Coronary disease status post redo CABG in 2007. LIMA graft to the LAD is patent. Other grafts are occluded. Prior stent of the first diagonal. He denies any significant chest pain. His primary complaint is of decreased energy and leg fatigue I reviewed all prior cardiac studies. His recent ETT was predictably abnormal based on his known anatomy. I don't think stress testing is particularly helpful in his situation. I am not inclined to pursue further work up unless he is having more angina. He is on good medical therapy.  We'll continue with Plavix, isosorbide, amlodipine, or and metoprolol. He is also on a baby aspirin daily. I will obtain LE arterial dopplers to rule out significant PAD since his symptoms are more worrisome for claudication. If this looks OK I will just follow closely in 3 months.   2. Hypertension, BP well controlled. Continue Rx.   3. Hypercholesterolemia continue atorvastatin. Excellent control.   4. Chronic congestive heart failure with ischemic cardiomyopathy. Ejection fraction 40-45%. He is asymptomatic. No evidence of volume overload. Continue beta blocker and ACE inhibitor.  5. Carotid arterial disease status post carotid endarterectomy. Carotid Dopplers are stable. Follow yearly.

## 2016-02-18 ENCOUNTER — Other Ambulatory Visit: Payer: Self-pay | Admitting: Cardiology

## 2016-02-18 DIAGNOSIS — I739 Peripheral vascular disease, unspecified: Secondary | ICD-10-CM

## 2016-02-18 DIAGNOSIS — H6590 Unspecified nonsuppurative otitis media, unspecified ear: Secondary | ICD-10-CM | POA: Diagnosis not present

## 2016-02-18 DIAGNOSIS — J302 Other seasonal allergic rhinitis: Secondary | ICD-10-CM | POA: Diagnosis not present

## 2016-02-19 DIAGNOSIS — D225 Melanocytic nevi of trunk: Secondary | ICD-10-CM | POA: Diagnosis not present

## 2016-02-19 DIAGNOSIS — D485 Neoplasm of uncertain behavior of skin: Secondary | ICD-10-CM | POA: Diagnosis not present

## 2016-02-25 DIAGNOSIS — E785 Hyperlipidemia, unspecified: Secondary | ICD-10-CM | POA: Diagnosis not present

## 2016-02-25 DIAGNOSIS — I129 Hypertensive chronic kidney disease with stage 1 through stage 4 chronic kidney disease, or unspecified chronic kidney disease: Secondary | ICD-10-CM | POA: Diagnosis not present

## 2016-02-25 DIAGNOSIS — E1122 Type 2 diabetes mellitus with diabetic chronic kidney disease: Secondary | ICD-10-CM | POA: Diagnosis not present

## 2016-02-25 DIAGNOSIS — Z0000 Encounter for general adult medical examination without abnormal findings: Secondary | ICD-10-CM | POA: Diagnosis not present

## 2016-03-03 ENCOUNTER — Ambulatory Visit (HOSPITAL_COMMUNITY)
Admission: RE | Admit: 2016-03-03 | Discharge: 2016-03-03 | Disposition: A | Discharge status: Home / Self Care | Payer: PPO | Source: Ambulatory Visit | Attending: Cardiovascular Disease | Admitting: Cardiovascular Disease

## 2016-03-03 DIAGNOSIS — I255 Ischemic cardiomyopathy: Secondary | ICD-10-CM | POA: Diagnosis not present

## 2016-03-03 DIAGNOSIS — I129 Hypertensive chronic kidney disease with stage 1 through stage 4 chronic kidney disease, or unspecified chronic kidney disease: Secondary | ICD-10-CM | POA: Diagnosis not present

## 2016-03-03 DIAGNOSIS — N183 Chronic kidney disease, stage 3 (moderate): Secondary | ICD-10-CM | POA: Diagnosis not present

## 2016-03-03 DIAGNOSIS — I130 Hypertensive heart and chronic kidney disease with heart failure and stage 1 through stage 4 chronic kidney disease, or unspecified chronic kidney disease: Secondary | ICD-10-CM | POA: Insufficient documentation

## 2016-03-03 DIAGNOSIS — R938 Abnormal findings on diagnostic imaging of other specified body structures: Secondary | ICD-10-CM | POA: Insufficient documentation

## 2016-03-03 DIAGNOSIS — E7800 Pure hypercholesterolemia, unspecified: Secondary | ICD-10-CM | POA: Diagnosis not present

## 2016-03-03 DIAGNOSIS — E1122 Type 2 diabetes mellitus with diabetic chronic kidney disease: Secondary | ICD-10-CM | POA: Diagnosis not present

## 2016-03-03 DIAGNOSIS — I25118 Atherosclerotic heart disease of native coronary artery with other forms of angina pectoris: Secondary | ICD-10-CM | POA: Diagnosis not present

## 2016-03-03 DIAGNOSIS — I739 Peripheral vascular disease, unspecified: Secondary | ICD-10-CM | POA: Insufficient documentation

## 2016-03-03 DIAGNOSIS — I5022 Chronic systolic (congestive) heart failure: Secondary | ICD-10-CM | POA: Diagnosis not present

## 2016-03-03 DIAGNOSIS — F17211 Nicotine dependence, cigarettes, in remission: Secondary | ICD-10-CM | POA: Diagnosis not present

## 2016-03-03 DIAGNOSIS — I2510 Atherosclerotic heart disease of native coronary artery without angina pectoris: Secondary | ICD-10-CM | POA: Insufficient documentation

## 2016-03-03 DIAGNOSIS — I209 Angina pectoris, unspecified: Secondary | ICD-10-CM | POA: Diagnosis not present

## 2016-03-05 ENCOUNTER — Telehealth: Payer: Self-pay | Admitting: Cardiology

## 2016-03-05 NOTE — Telephone Encounter (Signed)
Returned call to patient lower ext doppler results given.

## 2016-03-05 NOTE — Telephone Encounter (Signed)
New  Message ° °Pt returned call  °

## 2016-03-10 ENCOUNTER — Encounter: Payer: Self-pay | Admitting: Cardiology

## 2016-03-10 DIAGNOSIS — M0589 Other rheumatoid arthritis with rheumatoid factor of multiple sites: Secondary | ICD-10-CM | POA: Diagnosis not present

## 2016-03-10 DIAGNOSIS — M1A09X0 Idiopathic chronic gout, multiple sites, without tophus (tophi): Secondary | ICD-10-CM | POA: Diagnosis not present

## 2016-03-10 DIAGNOSIS — Z79899 Other long term (current) drug therapy: Secondary | ICD-10-CM | POA: Diagnosis not present

## 2016-03-16 ENCOUNTER — Other Ambulatory Visit: Payer: Self-pay

## 2016-03-16 MED ORDER — CLOPIDOGREL BISULFATE 75 MG PO TABS: 75.0000 mg | ORAL_TABLET | Freq: Every day | ORAL | Status: DC

## 2016-03-17 DIAGNOSIS — D0471 Carcinoma in situ of skin of right lower limb, including hip: Secondary | ICD-10-CM | POA: Diagnosis not present

## 2016-03-17 DIAGNOSIS — C44719 Basal cell carcinoma of skin of left lower limb, including hip: Secondary | ICD-10-CM | POA: Diagnosis not present

## 2016-03-17 DIAGNOSIS — C44722 Squamous cell carcinoma of skin of right lower limb, including hip: Secondary | ICD-10-CM | POA: Diagnosis not present

## 2016-04-20 ENCOUNTER — Other Ambulatory Visit: Payer: Self-pay | Admitting: Cardiology

## 2016-05-11 ENCOUNTER — Other Ambulatory Visit: Payer: Self-pay | Admitting: Cardiology

## 2016-05-24 ENCOUNTER — Other Ambulatory Visit: Payer: Self-pay | Admitting: Cardiology

## 2016-05-25 NOTE — Telephone Encounter (Signed)
Rx request sent to pharmacy.  

## 2016-06-04 DIAGNOSIS — E1122 Type 2 diabetes mellitus with diabetic chronic kidney disease: Secondary | ICD-10-CM | POA: Diagnosis not present

## 2016-06-04 DIAGNOSIS — I129 Hypertensive chronic kidney disease with stage 1 through stage 4 chronic kidney disease, or unspecified chronic kidney disease: Secondary | ICD-10-CM | POA: Diagnosis not present

## 2016-06-09 DIAGNOSIS — F17211 Nicotine dependence, cigarettes, in remission: Secondary | ICD-10-CM | POA: Diagnosis not present

## 2016-06-09 DIAGNOSIS — Z23 Encounter for immunization: Secondary | ICD-10-CM | POA: Diagnosis not present

## 2016-06-09 DIAGNOSIS — E1122 Type 2 diabetes mellitus with diabetic chronic kidney disease: Secondary | ICD-10-CM | POA: Diagnosis not present

## 2016-06-09 DIAGNOSIS — I209 Angina pectoris, unspecified: Secondary | ICD-10-CM | POA: Diagnosis not present

## 2016-06-09 DIAGNOSIS — I5022 Chronic systolic (congestive) heart failure: Secondary | ICD-10-CM | POA: Diagnosis not present

## 2016-06-09 DIAGNOSIS — I129 Hypertensive chronic kidney disease with stage 1 through stage 4 chronic kidney disease, or unspecified chronic kidney disease: Secondary | ICD-10-CM | POA: Diagnosis not present

## 2016-06-11 DIAGNOSIS — Z79899 Other long term (current) drug therapy: Secondary | ICD-10-CM | POA: Diagnosis not present

## 2016-06-11 DIAGNOSIS — M1A09X0 Idiopathic chronic gout, multiple sites, without tophus (tophi): Secondary | ICD-10-CM | POA: Diagnosis not present

## 2016-06-11 DIAGNOSIS — M0589 Other rheumatoid arthritis with rheumatoid factor of multiple sites: Secondary | ICD-10-CM | POA: Diagnosis not present

## 2016-06-11 DIAGNOSIS — M779 Enthesopathy, unspecified: Secondary | ICD-10-CM | POA: Diagnosis not present

## 2016-06-14 ENCOUNTER — Encounter: Payer: Self-pay | Admitting: Cardiology

## 2016-06-23 DIAGNOSIS — C44519 Basal cell carcinoma of skin of other part of trunk: Secondary | ICD-10-CM | POA: Diagnosis not present

## 2016-06-23 DIAGNOSIS — D1801 Hemangioma of skin and subcutaneous tissue: Secondary | ICD-10-CM | POA: Diagnosis not present

## 2016-06-23 DIAGNOSIS — Z85828 Personal history of other malignant neoplasm of skin: Secondary | ICD-10-CM | POA: Diagnosis not present

## 2016-06-23 DIAGNOSIS — C44219 Basal cell carcinoma of skin of left ear and external auricular canal: Secondary | ICD-10-CM | POA: Diagnosis not present

## 2016-06-23 DIAGNOSIS — L814 Other melanin hyperpigmentation: Secondary | ICD-10-CM | POA: Diagnosis not present

## 2016-06-23 DIAGNOSIS — L821 Other seborrheic keratosis: Secondary | ICD-10-CM | POA: Diagnosis not present

## 2016-06-23 DIAGNOSIS — D485 Neoplasm of uncertain behavior of skin: Secondary | ICD-10-CM | POA: Diagnosis not present

## 2016-06-23 DIAGNOSIS — D0462 Carcinoma in situ of skin of left upper limb, including shoulder: Secondary | ICD-10-CM | POA: Diagnosis not present

## 2016-06-23 DIAGNOSIS — D0439 Carcinoma in situ of skin of other parts of face: Secondary | ICD-10-CM | POA: Diagnosis not present

## 2016-06-23 DIAGNOSIS — L570 Actinic keratosis: Secondary | ICD-10-CM | POA: Diagnosis not present

## 2016-06-24 NOTE — Progress Notes (Signed)
Cardiology Office Note    Date:  06/25/2016   ID:  Shadrack, Carabello [birth year], MRN [id number]  PCP:  Thressa Sheller, MD  Cardiologist:  Peter Martinique, MD    History of Present Illness:  [name] [name] is a 82 y.o. male with a history of coronary disease and is status post redo coronary bypass surgery in 2007. In 2008 he was found to have occlusion of SVG to the intermediate branch and severe degeneration in SVG to RCA and distal LCx. The SVG was stented as was the native diagonal.  Cardiac catheterization in 2010 showed occlusion of the vein graft to the intermediate vessel and to the posterior descending artery. The stent in the diagonal was patent. The native LCx, intermediate, and RCA were occluded. He had a patent LIMA graft to the LAD and moderate left ventricular dysfunction. He has been treated medically. On follow up today he denies any chest pin or SOB and still enjoys playing golf. He is doing water exercise once a week. LE dopplers showed mild PAD with normal ABIs.  He was scheduled for an ETT which was done on 02/06/16. He walked 5 minutes with adequate HR and BP response. He had no chest pain. Ecg showed 3 mm ST depression in the inferolateral leads. This was felt to be consistent with his known disease and given lack of symptoms was not pursued further.    Past Medical History:  Diagnosis Date  . CAD (coronary artery disease)    a. s/p  MI in 1988 and CABG 1989;  b. s/p redo CABG 2007;  c. s/p PCI DES to VG->PDA and native Diag;  d. 2010 Cath: occluded VG's to RI and PDA, patent LIMA->LAD, patent stent in diag->med Rx.  . Carotid arterial disease ([place])    a. s/p bilat patch angioplasties;  b. 0000000 RICA [id number], LICA [id number]  . Chronic systolic CHF (congestive heart failure) ([place])    a. 03/2008 EF 30% noted on Myoview.  . CKD (chronic kidney disease), stage III   . Gout   . History of TIAs 2000  . HTN (hypertension)   . Hypercholesteremia   . Ischemic  cardiomyopathy    a. 03/2008 EF 30% noted on Myoview.  . Prostate cancer ([place])   . Rheumatoid arthritis [facility])     Past Surgical History:  Procedure Laterality Date  . BACK SURGERY    . CARDIOVASCULAR STRESS TEST  04-02-08   EF 30%  . CAROTID ENDARTERECTOMY  1992, 2007   LEFT, RIGHT  . CHOLECYSTECTOMY N/A 06/22/2013   Procedure: LAPAROSCOPIC CHOLECYSTECTOMY;  Surgeon: Gayland Curry, MD;  Location: [facility];  Service: General;  Laterality: N/A;  . CORONARY ARTERY BYPASS GRAFT  [id number]  . KNEE ARTHROSCOPY     RIGHT KNEE  . RADIOACTIVE SEED IMPLANT  2005    Current Medications: Outpatient Medications Prior to Visit  Medication Sig Dispense Refill  . allopurinol (ZYLOPRIM) 300 MG tablet TAKE 1 TABLET BY MOUTH EVERY DAY 90 tablet 1  . allopurinol (ZYLOPRIM) 300 MG tablet TAKE 1 TABLET BY MOUTH EVERY DAY 90 tablet 0  . amLODipine (NORVASC) 10 MG tablet Take 10 mg by mouth daily.      Marland Kitchen aspirin 81 MG tablet Take 81 mg by mouth daily.      Marland Kitchen atorvastatin (LIPITOR) 40 MG tablet Take 80 mg by mouth daily.     . benazepril (LOTENSIN) 40 MG tablet Take 40 mg by mouth daily.      Marland Kitchen  clopidogrel (PLAVIX) 75 MG tablet Take 1 tablet (75 mg total) by mouth daily. NEED OV. 90 tablet 2  . ergocalciferol (VITAMIN D2) 50000 UNITS capsule Take 50,000 Units by mouth every 30 (thirty) days. Twice monthly 1st and 15th    . hydrALAZINE (APRESOLINE) 25 MG tablet TAKE 1 TABLET BY MOUTH THREE TIMES DAILY 270 tablet 3  . hydrochlorothiazide (HYDRODIURIL) 25 MG tablet Take 25 mg by mouth daily.    . isosorbide mononitrate (IMDUR) 30 MG 24 hr tablet Take 1 tablet (30 mg total) by mouth 2 (two) times daily. 180 tablet 3  . metFORMIN (GLUCOPHAGE) 500 MG tablet Take 1 tablet by mouth every evening.  4  . methotrexate (RHEUMATREX) 2.5 MG tablet Take 2.5 mg by mouth once a week. Caution:Chemotherapy. Protect from light. 10 tablets once a week    . metoprolol tartrate (LOPRESSOR) 25 MG tablet Take 12.5 mg by mouth 2 (two)  times daily.      Marland Kitchen OVER THE COUNTER MEDICATION Take 1 tablet by mouth 2 (two) times daily. lysene    . nitroGLYCERIN (NITROSTAT) 0.4 MG SL tablet Place 1 tablet (0.4 mg total) under the tongue every 5 (five) minutes as needed. 25 tablet 3  . predniSONE (DELTASONE) 5 MG tablet Take 5 mg by mouth daily with breakfast.     No facility-administered medications prior to visit.      Allergies:   Review of patient's allergies indicates no known allergies.   Social History   Social History  . Marital status: Married    Spouse name: N/A  . Number of children: 3  . Years of education: N/A   Occupational History  . Insurance company safety     retired   Social History Main Topics  . Smoking status: Former Smoker    Types: Cigarettes    Quit date: 09/27/1986  . Smokeless tobacco: Never Used  . Alcohol use No  . Drug use: No  . Sexual activity: Not Asked   Other Topics Concern  . None   Social History Narrative   Lives in [place] with wife.  Works at a [employer] two days/wk and does water walking 3 days/wk x 30-45 mins per session.     Family History:  The patient's family history includes Cancer (age of onset: 40) in his mother; Other (age of onset: 74) in his father.   ROS:   Please see the history of present illness.    ROS All other systems reviewed and are negative.   PHYSICAL EXAM:   VS:  BP (!) 152/54   Pulse (!) 45   Ht 6\' 1"  (1.854 m)   Wt 179 lb (81.2 kg)   BMI 23.62 kg/m    GEN: Well nourished, well developed, in no acute distress  HEENT: normal  Neck: no JVD, carotid bruits, or masses Cardiac: RRR; no murmurs, rubs, or gallops,no edema  Respiratory:  clear to auscultation bilaterally, normal work of breathing GI: soft, nontender, nondistended, + BS MS: no deformity or atrophy  Skin: warm and dry, no rash Neuro:  Alert and Oriented x 3, Strength and sensation are intact Psych: euthymic mood, full affect  Wt Readings from Last 3 Encounters:    06/25/16 179 lb (81.2 kg)  02/12/16 183 lb (83 kg)  06/27/15 185 lb 3.2 oz (84 kg)      Studies/Labs Reviewed:   EKG:  EKG is not ordered today.  The ekg ordered today demonstrates N/A  Recent Labs: Labs dated  06/04/16: cholesterol 118, triglycerides 155, HDL 32, LDL 55. A1c 5.9%. BUN 26, creatinine 1.2. Other chemistries normal.   Lipid Panel    Component Value Date/Time   CHOL 137 11/12/2010   TRIG 151 11/12/2010   HDL 33 (A) 11/12/2010   LDLCALC 74 11/12/2010    Additional studies/ records that were reviewed today include:  none   ASSESSMENT:   1. Coronary disease status post redo CABG in 2007. LIMA graft to the LAD is patent. Other grafts are occluded. Prior stent of the first diagonal. He denies any significant chest pain. His primary complaint is of decreased energy and leg fatigue I reviewed all prior cardiac studies. His recent ETT was predictably abnormal based on his known anatomy. I don't think stress testing is particularly helpful in his situation. I am not inclined to pursue further work up unless he is having more angina. He is on good medical therapy.  We'll continue with Plavix, isosorbide, amlodipine, or and metoprolol. He is also on a baby aspirin daily.  2. Hypertension, BP fairly well controlled. Continue Rx.   3. Hypercholesterolemia continue atorvastatin. Excellent control.   4. Chronic congestive heart failure with ischemic cardiomyopathy. Ejection fraction 40-45%. He is asymptomatic. No evidence of volume overload. Continue beta blocker and ACE inhibitor.  5. Carotid arterial disease status post carotid endarterectomy. Carotid Dopplers are stable. Follow yearly.   PLAN:     Medication Adjustments/Labs and Tests Ordered: Current medicines are reviewed at length with the patient today.  Concerns regarding medicines are outlined above.  Medication changes, Labs and Tests ordered today are listed in the Patient Instructions below. Patient  Instructions  Continue your current therapy  I will see you in 6 months     Signed, Peter Martinique, MD  06/25/2016 3:20 PM    [facility] [address], [place], [place], [address] [phone]

## 2016-06-25 ENCOUNTER — Encounter: Payer: Self-pay | Admitting: Cardiology

## 2016-06-25 ENCOUNTER — Ambulatory Visit (INDEPENDENT_AMBULATORY_CARE_PROVIDER_SITE_OTHER): Payer: PPO | Admitting: Cardiology

## 2016-06-25 VITALS — BP 152/54 | HR 45 | Ht 73.0 in | Wt 179.0 lb

## 2016-06-25 DIAGNOSIS — I779 Disorder of arteries and arterioles, unspecified: Secondary | ICD-10-CM

## 2016-06-25 DIAGNOSIS — I25708 Atherosclerosis of coronary artery bypass graft(s), unspecified, with other forms of angina pectoris: Secondary | ICD-10-CM

## 2016-06-25 DIAGNOSIS — I10 Essential (primary) hypertension: Secondary | ICD-10-CM

## 2016-06-25 DIAGNOSIS — E7800 Pure hypercholesterolemia, unspecified: Secondary | ICD-10-CM

## 2016-06-25 DIAGNOSIS — I739 Peripheral vascular disease, unspecified: Secondary | ICD-10-CM

## 2016-06-25 MED ORDER — NITROGLYCERIN 0.4 MG SL SUBL: 0.4000 mg | SUBLINGUAL_TABLET | SUBLINGUAL | 6 refills | Status: AC | PRN

## 2016-06-25 NOTE — Patient Instructions (Signed)
Continue your current therapy  I will see you in 6 months.   

## 2016-07-14 DIAGNOSIS — H35352 Cystoid macular degeneration, left eye: Secondary | ICD-10-CM | POA: Diagnosis not present

## 2016-07-14 DIAGNOSIS — E119 Type 2 diabetes mellitus without complications: Secondary | ICD-10-CM | POA: Diagnosis not present

## 2016-07-14 DIAGNOSIS — Z961 Presence of intraocular lens: Secondary | ICD-10-CM | POA: Diagnosis not present

## 2016-07-14 DIAGNOSIS — H35372 Puckering of macula, left eye: Secondary | ICD-10-CM | POA: Diagnosis not present

## 2016-07-14 DIAGNOSIS — H2511 Age-related nuclear cataract, right eye: Secondary | ICD-10-CM | POA: Diagnosis not present

## 2016-07-23 ENCOUNTER — Other Ambulatory Visit: Payer: Self-pay | Admitting: Cardiology

## 2016-08-11 DIAGNOSIS — D0462 Carcinoma in situ of skin of left upper limb, including shoulder: Secondary | ICD-10-CM | POA: Diagnosis not present

## 2016-08-11 DIAGNOSIS — C44519 Basal cell carcinoma of skin of other part of trunk: Secondary | ICD-10-CM | POA: Diagnosis not present

## 2016-08-24 ENCOUNTER — Other Ambulatory Visit: Payer: Self-pay

## 2016-08-24 MED ORDER — ISOSORBIDE MONONITRATE ER 30 MG PO TB24: 30.0000 mg | ORAL_TABLET | Freq: Two times a day (BID) | ORAL | 3 refills | Status: DC

## 2016-08-27 ENCOUNTER — Emergency Department (HOSPITAL_COMMUNITY)
Admission: EM | Admit: 2016-08-27 | Discharge: 2016-08-27 | Disposition: A | Discharge status: Home / Self Care | Payer: PPO | Attending: Emergency Medicine | Admitting: Emergency Medicine

## 2016-08-27 ENCOUNTER — Encounter (HOSPITAL_COMMUNITY): Payer: Self-pay | Admitting: Emergency Medicine

## 2016-08-27 DIAGNOSIS — Z951 Presence of aortocoronary bypass graft: Secondary | ICD-10-CM | POA: Insufficient documentation

## 2016-08-27 DIAGNOSIS — R1031 Right lower quadrant pain: Secondary | ICD-10-CM | POA: Insufficient documentation

## 2016-08-27 DIAGNOSIS — Z7982 Long term (current) use of aspirin: Secondary | ICD-10-CM | POA: Diagnosis not present

## 2016-08-27 DIAGNOSIS — Z7984 Long term (current) use of oral hypoglycemic drugs: Secondary | ICD-10-CM | POA: Diagnosis not present

## 2016-08-27 DIAGNOSIS — Z87891 Personal history of nicotine dependence: Secondary | ICD-10-CM | POA: Diagnosis not present

## 2016-08-27 DIAGNOSIS — Z955 Presence of coronary angioplasty implant and graft: Secondary | ICD-10-CM | POA: Insufficient documentation

## 2016-08-27 DIAGNOSIS — I130 Hypertensive heart and chronic kidney disease with heart failure and stage 1 through stage 4 chronic kidney disease, or unspecified chronic kidney disease: Secondary | ICD-10-CM | POA: Insufficient documentation

## 2016-08-27 DIAGNOSIS — I252 Old myocardial infarction: Secondary | ICD-10-CM | POA: Insufficient documentation

## 2016-08-27 DIAGNOSIS — I2510 Atherosclerotic heart disease of native coronary artery without angina pectoris: Secondary | ICD-10-CM | POA: Diagnosis not present

## 2016-08-27 DIAGNOSIS — I5022 Chronic systolic (congestive) heart failure: Secondary | ICD-10-CM | POA: Insufficient documentation

## 2016-08-27 DIAGNOSIS — Z8546 Personal history of malignant neoplasm of prostate: Secondary | ICD-10-CM | POA: Diagnosis not present

## 2016-08-27 DIAGNOSIS — N183 Chronic kidney disease, stage 3 (moderate): Secondary | ICD-10-CM | POA: Insufficient documentation

## 2016-08-27 LAB — URINE MICROSCOPIC-ADD ON

## 2016-08-27 LAB — CBC
HCT: 38.4 % — ABNORMAL LOW (ref 39.0–52.0)
HEMOGLOBIN: 12.7 g/dL — AB (ref 13.0–17.0)
MCH: 28.9 pg (ref 26.0–34.0)
MCHC: 33.1 g/dL (ref 30.0–36.0)
MCV: 87.3 fL (ref 78.0–100.0)
PLATELETS: 226 10*3/uL (ref 150–400)
RBC: 4.4 MIL/uL (ref 4.22–5.81)
RDW: 15.3 % (ref 11.5–15.5)
WBC: 9.4 10*3/uL (ref 4.0–10.5)

## 2016-08-27 LAB — COMPREHENSIVE METABOLIC PANEL
ALBUMIN: 3.2 g/dL — AB (ref 3.5–5.0)
ALK PHOS: 100 U/L (ref 38–126)
ALT: 17 U/L (ref 17–63)
AST: 24 U/L (ref 15–41)
Anion gap: 8 (ref 5–15)
BUN: 30 mg/dL — AB (ref 6–20)
CALCIUM: 9.7 mg/dL (ref 8.9–10.3)
CO2: 25 mmol/L (ref 22–32)
CREATININE: 1.57 mg/dL — AB (ref 0.61–1.24)
Chloride: 104 mmol/L (ref 101–111)
GFR calc Af Amer: 46 mL/min — ABNORMAL LOW (ref >60)
GFR calc non Af Amer: 39 mL/min — ABNORMAL LOW (ref >60)
GLUCOSE: 88 mg/dL (ref 65–99)
Potassium: 4.2 mmol/L (ref 3.5–5.1)
SODIUM: 137 mmol/L (ref 135–145)
Total Bilirubin: 1.3 mg/dL — ABNORMAL HIGH (ref 0.3–1.2)
Total Protein: 6.3 g/dL — ABNORMAL LOW (ref 6.5–8.1)

## 2016-08-27 LAB — URINALYSIS, ROUTINE W REFLEX MICROSCOPIC
Glucose, UA: NEGATIVE mg/dL
HGB URINE DIPSTICK: NEGATIVE
Ketones, ur: NEGATIVE mg/dL
Leukocytes, UA: NEGATIVE
Nitrite: NEGATIVE
Protein, ur: 100 mg/dL — AB
SPECIFIC GRAVITY, URINE: 1.026 (ref 1.005–1.030)
pH: 5 (ref 5.0–8.0)

## 2016-08-27 LAB — LIPASE, BLOOD: Lipase: 36 U/L (ref 11–51)

## 2016-08-27 NOTE — Discharge Instructions (Signed)
Watch for fevers or increased pain. Watch for swelling.

## 2016-08-27 NOTE — ED Notes (Signed)
Pt verbalized understanding discharge instructions and denies any further needs or questions at this time. VS stable, ambulatory and steady gait.   

## 2016-08-27 NOTE — ED Triage Notes (Signed)
Pf tstates "ive got pain my lower right side" Pt point to R groin. Tylenol helps with the pain. Pt also c/o constipation. Pt denies problems urination. Pt states he seems like he needs to go more often. Denies flank pain or back pain. Hx of prostate cancer.

## 2016-08-27 NOTE — ED Provider Notes (Signed)
[facility] DEPT Provider Note   CSN: [id number] Arrival date & time: 08/27/16  1133     History   Chief Complaint Chief Complaint  Patient presents with  . Groin Pain    HPI [name] [name] is a 82 y.o. male.  HPI Patient presents with right groin pain. Began a couple days ago. It is dull. Worse with palpation. No dysuria. No fevers or chills. No nausea vomiting. He has been constipated, which is unusual for him. He has not had pains like this before. No swelling. No rash. Started on the right side and one across abdomen. Has improved somewhat since being in the ER.   Past Medical History:  Diagnosis Date  . CAD (coronary artery disease)    a. s/p  MI in 1988 and CABG 1989;  b. s/p redo CABG 2007;  c. s/p PCI DES to VG->PDA and native Diag;  d. 2010 Cath: occluded VG's to RI and PDA, patent LIMA->LAD, patent stent in diag->med Rx.  . Carotid arterial disease ([place])    a. s/p bilat patch angioplasties;  b. 0000000 RICA [id number], LICA 123456  . Chronic systolic CHF (congestive heart failure) ([place])    a. 03/2008 EF 30% noted on Myoview.  . CKD (chronic kidney disease), stage III   . Gout   . History of TIAs 2000  . HTN (hypertension)   . Hypercholesteremia   . Ischemic cardiomyopathy    a. 03/2008 EF 30% noted on Myoview.  . Prostate cancer ([place])   . Rheumatoid arthritis [facility])     Patient Active Problem List   Diagnosis Date Noted  . Unspecified constipation 06/24/2013  . Acute renal failure ([place]) 06/21/2013  . Acute cholecystitis 06/20/2013  . Chronic systolic CHF (congestive heart failure) ([place]) 08/25/2012  . Carotid arterial disease ([place])   . CAD (coronary artery disease)   . HTN (hypertension)   . Hypercholesteremia   . Chronic renal insufficiency   . Left ventricular dysfunction   . Gout   . History of TIAs   . Prostate cancer [facility])     Past Surgical History:  Procedure Laterality Date  . BACK SURGERY    . CARDIOVASCULAR STRESS TEST  04-02-08   EF 30%  .  CAROTID ENDARTERECTOMY  1992, 2007   LEFT, RIGHT  . CHOLECYSTECTOMY N/A 06/22/2013   Procedure: LAPAROSCOPIC CHOLECYSTECTOMY;  Surgeon: Gayland Curry, MD;  Location: [facility];  Service: General;  Laterality: N/A;  . CORONARY ARTERY BYPASS GRAFT  [id number]  . KNEE ARTHROSCOPY     RIGHT KNEE  . RADIOACTIVE SEED IMPLANT  2005       Home Medications    Prior to Admission medications   Medication Sig Start Date End Date Taking? Authorizing Provider  allopurinol (ZYLOPRIM) 300 MG tablet TAKE 1 TABLET BY MOUTH EVERY DAY 10/31/15   Peter M Martinique, MD  amLODipine (NORVASC) 10 MG tablet Take 10 mg by mouth daily.      Historical Provider, MD  aspirin 81 MG tablet Take 81 mg by mouth daily.      Historical Provider, MD  atorvastatin (LIPITOR) 40 MG tablet Take 80 mg by mouth daily.     Historical Provider, MD  benazepril (LOTENSIN) 40 MG tablet Take 40 mg by mouth daily.      Historical Provider, MD  clopidogrel (PLAVIX) 75 MG tablet Take 1 tablet (75 mg total) by mouth daily. NEED OV. 03/16/16   Peter M Martinique, MD  ergocalciferol (VITAMIN D2) 50000 UNITS  capsule Take 50,000 Units by mouth every 30 (thirty) days. Twice monthly 1st and 15th    Historical Provider, MD  hydrALAZINE (APRESOLINE) 25 MG tablet TAKE 1 TABLET BY MOUTH THREE TIMES DAILY 05/25/16   Peter M Martinique, MD  hydrochlorothiazide (HYDRODIURIL) 25 MG tablet Take 25 mg by mouth daily.    Historical Provider, MD  isosorbide mononitrate (IMDUR) 30 MG 24 hr tablet Take 1 tablet (30 mg total) by mouth 2 (two) times daily. 08/24/16   Peter M Martinique, MD  metFORMIN (GLUCOPHAGE) 500 MG tablet Take 1 tablet by mouth every evening. 10/22/14   Historical Provider, MD  methotrexate (RHEUMATREX) 2.5 MG tablet Take 2.5 mg by mouth once a week. Caution:Chemotherapy. Protect from light. 10 tablets once a week    Historical Provider, MD  metoprolol tartrate (LOPRESSOR) 25 MG tablet Take 12.5 mg by mouth 2 (two) times daily.      Historical Provider, MD    nitroGLYCERIN (NITROSTAT) 0.4 MG SL tablet Place 1 tablet (0.4 mg total) under the tongue every 5 (five) minutes as needed. 06/25/16   Peter M Martinique, MD  OVER THE COUNTER MEDICATION Take 1 tablet by mouth 2 (two) times daily. lysene    Historical Provider, MD    Family History Family History  Problem Relation Age of Onset  . Other Father 9    CEREBRAL HEMORRHAGE  . Cancer Mother 71    Social History Social History  Substance Use Topics  . Smoking status: Former Smoker    Types: Cigarettes    Quit date: 09/27/1986  . Smokeless tobacco: Never Used  . Alcohol use No     Allergies   Patient has no known allergies.   Review of Systems Review of Systems  Constitutional: Negative for appetite change.  HENT: Negative for congestion.   Respiratory: Negative for shortness of breath.   Cardiovascular: Negative for chest pain.  Gastrointestinal: Positive for abdominal pain and constipation. Negative for nausea and vomiting.  Genitourinary: Negative for dysuria and frequency.  Musculoskeletal: Negative for back pain.  Neurological: Negative for headaches.     Physical Exam Updated Vital Signs BP (!) 130/53   Pulse 67   Temp 98 F (36.7 C) (Oral)   Resp 16   SpO2 99%   Physical Exam  Constitutional: He appears well-developed.  HENT:  Head: Atraumatic.  Eyes: Pupils are equal, round, and reactive to light.  Neck: Neck supple.  Cardiovascular: Normal rate.   Pulmonary/Chest: Effort normal.  Abdominal: There is tenderness.  Mild tenderness on his abdomen just superior to his inguinal area on the right side.. No rash. No hernia palpated. No rebound. No guarding.  Musculoskeletal: He exhibits no edema.  Neurological: He is alert.  Skin: Skin is warm.  Psychiatric: He has a normal mood and affect.     ED Treatments / Results  Labs (all labs ordered are listed, but only abnormal results are displayed) Labs Reviewed  COMPREHENSIVE METABOLIC PANEL - Abnormal; Notable  for the following:       Result Value   BUN 30 (*)    Creatinine, Ser 1.57 (*)    Total Protein 6.3 (*)    Albumin 3.2 (*)    Total Bilirubin 1.3 (*)    GFR calc non Af Amer 39 (*)    GFR calc Af Amer 46 (*)    All other components within normal limits  CBC - Abnormal; Notable for the following:    Hemoglobin 12.7 (*)    HCT  38.4 (*)    All other components within normal limits  URINALYSIS, ROUTINE W REFLEX MICROSCOPIC (NOT AT [facility]) - Abnormal; Notable for the following:    Bilirubin Urine SMALL (*)    Protein, ur 100 (*)    All other components within normal limits  URINE MICROSCOPIC-ADD ON - Abnormal; Notable for the following:    Squamous Epithelial / LPF 0-5 (*)    Bacteria, UA FEW (*)    Casts GRANULAR CAST (*)    All other components within normal limits  LIPASE, BLOOD    EKG  EKG Interpretation None       Radiology No results found.  Procedures Procedures (including critical care time)  Medications Ordered in ED Medications - No data to display   Initial Impression / Assessment and Plan / ED Course  I have reviewed the triage vital signs and the nursing notes.  Pertinent labs & imaging results that were available during my care of the patient were reviewed by me and considered in my medical decision making (see chart for details).  Clinical Course     Patient abdominal pain. Right groin. Overall benign exam. Doubt obstruction at this time. His somewhat constipated. Appendicitis considered but felt less likely. Has normal labs. Discussed precautions for the patient. Will discharge home.  Final Clinical Impressions(s) / ED Diagnoses   Final diagnoses:  Right inguinal pain    New Prescriptions New Prescriptions   No medications on file     Davonna Belling, MD 08/27/16 1615

## 2016-08-27 NOTE — ED Notes (Signed)
Reassessed pt. In the waiting room, He reports that his groin pain has decreased.  Pt. Remains NPO.  Updated pt. On plan of care and delay.   He verbalized understanding.  Informed him  To let the Rn know if any thing changes.

## 2016-09-02 DIAGNOSIS — S01302A Unspecified open wound of left ear, initial encounter: Secondary | ICD-10-CM | POA: Diagnosis not present

## 2016-09-02 DIAGNOSIS — D0422 Carcinoma in situ of skin of left ear and external auricular canal: Secondary | ICD-10-CM | POA: Diagnosis not present

## 2016-09-02 DIAGNOSIS — C44219 Basal cell carcinoma of skin of left ear and external auricular canal: Secondary | ICD-10-CM | POA: Diagnosis not present

## 2016-09-04 DIAGNOSIS — S01302A Unspecified open wound of left ear, initial encounter: Secondary | ICD-10-CM | POA: Diagnosis not present

## 2016-09-09 ENCOUNTER — Encounter (HOSPITAL_COMMUNITY): Payer: Self-pay | Admitting: Emergency Medicine

## 2016-09-09 ENCOUNTER — Emergency Department (HOSPITAL_COMMUNITY)
Admission: EM | Admit: 2016-09-09 | Discharge: 2016-09-09 | Disposition: A | Discharge status: Home / Self Care | Payer: PPO | Attending: Emergency Medicine | Admitting: Emergency Medicine

## 2016-09-09 DIAGNOSIS — Z7982 Long term (current) use of aspirin: Secondary | ICD-10-CM | POA: Diagnosis not present

## 2016-09-09 DIAGNOSIS — I2510 Atherosclerotic heart disease of native coronary artery without angina pectoris: Secondary | ICD-10-CM | POA: Insufficient documentation

## 2016-09-09 DIAGNOSIS — Z8673 Personal history of transient ischemic attack (TIA), and cerebral infarction without residual deficits: Secondary | ICD-10-CM | POA: Diagnosis not present

## 2016-09-09 DIAGNOSIS — R197 Diarrhea, unspecified: Secondary | ICD-10-CM | POA: Insufficient documentation

## 2016-09-09 DIAGNOSIS — Z7984 Long term (current) use of oral hypoglycemic drugs: Secondary | ICD-10-CM | POA: Diagnosis not present

## 2016-09-09 DIAGNOSIS — Z87891 Personal history of nicotine dependence: Secondary | ICD-10-CM | POA: Insufficient documentation

## 2016-09-09 DIAGNOSIS — I130 Hypertensive heart and chronic kidney disease with heart failure and stage 1 through stage 4 chronic kidney disease, or unspecified chronic kidney disease: Secondary | ICD-10-CM | POA: Insufficient documentation

## 2016-09-09 DIAGNOSIS — I5022 Chronic systolic (congestive) heart failure: Secondary | ICD-10-CM | POA: Insufficient documentation

## 2016-09-09 DIAGNOSIS — Z951 Presence of aortocoronary bypass graft: Secondary | ICD-10-CM | POA: Insufficient documentation

## 2016-09-09 DIAGNOSIS — R112 Nausea with vomiting, unspecified: Secondary | ICD-10-CM

## 2016-09-09 DIAGNOSIS — Z8546 Personal history of malignant neoplasm of prostate: Secondary | ICD-10-CM | POA: Diagnosis not present

## 2016-09-09 DIAGNOSIS — N183 Chronic kidney disease, stage 3 (moderate): Secondary | ICD-10-CM | POA: Diagnosis not present

## 2016-09-09 LAB — URINALYSIS, ROUTINE W REFLEX MICROSCOPIC
BILIRUBIN URINE: NEGATIVE
GLUCOSE, UA: NEGATIVE mg/dL
HGB URINE DIPSTICK: NEGATIVE
KETONES UR: NEGATIVE mg/dL
LEUKOCYTES UA: NEGATIVE
NITRITE: NEGATIVE
PH: 5 (ref 5.0–8.0)
Protein, ur: 100 mg/dL — AB
SPECIFIC GRAVITY, URINE: 1.023 (ref 1.005–1.030)

## 2016-09-09 LAB — CBC
HCT: 42.2 % (ref 39.0–52.0)
Hemoglobin: 13.9 g/dL (ref 13.0–17.0)
MCH: 29.2 pg (ref 26.0–34.0)
MCHC: 32.9 g/dL (ref 30.0–36.0)
MCV: 88.7 fL (ref 78.0–100.0)
PLATELETS: 282 10*3/uL (ref 150–400)
RBC: 4.76 MIL/uL (ref 4.22–5.81)
RDW: 15.5 % (ref 11.5–15.5)
WBC: 9.3 10*3/uL (ref 4.0–10.5)

## 2016-09-09 LAB — I-STAT TROPONIN, ED: Troponin i, poc: 0.01 ng/mL (ref 0.00–0.08)

## 2016-09-09 LAB — COMPREHENSIVE METABOLIC PANEL
ALT: 44 U/L (ref 17–63)
AST: 51 U/L — ABNORMAL HIGH (ref 15–41)
Albumin: 3.5 g/dL (ref 3.5–5.0)
Alkaline Phosphatase: 112 U/L (ref 38–126)
Anion gap: 9 (ref 5–15)
BILIRUBIN TOTAL: 2 mg/dL — AB (ref 0.3–1.2)
BUN: 36 mg/dL — ABNORMAL HIGH (ref 6–20)
CHLORIDE: 106 mmol/L (ref 101–111)
CO2: 25 mmol/L (ref 22–32)
CREATININE: 1.69 mg/dL — AB (ref 0.61–1.24)
Calcium: 9.5 mg/dL (ref 8.9–10.3)
GFR, EST AFRICAN AMERICAN: 42 mL/min — AB (ref >60)
GFR, EST NON AFRICAN AMERICAN: 36 mL/min — AB (ref >60)
Glucose, Bld: 140 mg/dL — ABNORMAL HIGH (ref 65–99)
POTASSIUM: 4.2 mmol/L (ref 3.5–5.1)
Sodium: 140 mmol/L (ref 135–145)
TOTAL PROTEIN: 6.4 g/dL — AB (ref 6.5–8.1)

## 2016-09-09 LAB — LIPASE, BLOOD: LIPASE: 26 U/L (ref 11–51)

## 2016-09-09 MED ORDER — ONDANSETRON 4 MG PO TBDP: 4.0000 mg | ORAL_TABLET | Freq: Three times a day (TID) | ORAL | 0 refills | Status: DC | PRN

## 2016-09-09 MED ORDER — ONDANSETRON 4 MG PO TBDP
4.0000 mg | ORAL_TABLET | Freq: Once | ORAL | Status: AC
  Administered 2016-09-09: 4 mg via ORAL
  Filled 2016-09-09: qty 1

## 2016-09-09 MED ORDER — ACETAMINOPHEN 325 MG PO TABS
650.0000 mg | ORAL_TABLET | Freq: Once | ORAL | Status: AC
  Administered 2016-09-09: 650 mg via ORAL
  Filled 2016-09-09: qty 2

## 2016-09-09 NOTE — ED Provider Notes (Signed)
[facility] DEPT Provider Note   CSN: [id number] Arrival date & time: 09/09/16  1611     History   Chief Complaint Chief Complaint  Patient presents with  . Abdominal Pain  . Emesis    HPI [name] [name] is a 82 y.o. male.  Patient is an 82 year old male with a history of coronary artery disease status post CABG and stenting last intervention was in 2007 who presents today with intermittent abdominal pain and vomiting. Patient states when he woke up this morning while brushing his teeth he had one episode of emesis. Then around noon before eating lunch she developed a terrible cramp in his mid abdomen that caused him to break out in a cold sweat and vomit. He took a baby aspirin and symptoms took approximately 10 minutes to resolve. Patient then had another episode about 1:30 that was similar with one episode of emesis. He denies any chest pain or shortness of breath during these episodes. The pain did not radiate anywhere. He states since waking up this morning he states his stomach has just felt uneasy but is only hurts sporadically. He denies any diarrhea. He has felt chilled today. No new medication changes. Patient was seen in the ER several weeks ago and at that time he had right inguinal pain which has since resolved.   The history is provided by the patient.  Abdominal Pain   This is a new problem. The current episode started 6 to 12 hours ago. Episode frequency: Started today. The problem has been rapidly improving. Associated with: Does not seem to be associated with anything. The pain is located in the periumbilical region. The pain is at a severity of 3/10. The pain is mild. Associated symptoms include anorexia, fever, nausea and vomiting. Pertinent negatives include dysuria and headaches. Associated symptoms comments: No chest pain or shortness of breath. Nothing aggravates the symptoms. Nothing relieves the symptoms.  Emesis   Associated symptoms include abdominal pain  and a fever. Pertinent negatives include no headaches.    Past Medical History:  Diagnosis Date  . CAD (coronary artery disease)    a. s/p  MI in 1988 and CABG 1989;  b. s/p redo CABG 2007;  c. s/p PCI DES to VG->PDA and native Diag;  d. 2010 Cath: occluded VG's to RI and PDA, patent LIMA->LAD, patent stent in diag->med Rx.  . Carotid arterial disease ([place])    a. s/p bilat patch angioplasties;  b. 0000000 RICA [id number], LICA 123456  . Chronic systolic CHF (congestive heart failure) ([place])    a. 03/2008 EF 30% noted on Myoview.  . CKD (chronic kidney disease), stage III   . Gout   . History of TIAs 2000  . HTN (hypertension)   . Hypercholesteremia   . Ischemic cardiomyopathy    a. 03/2008 EF 30% noted on Myoview.  . Prostate cancer ([place])   . Rheumatoid arthritis [facility])     Patient Active Problem List   Diagnosis Date Noted  . Unspecified constipation 06/24/2013  . Acute renal failure ([place]) 06/21/2013  . Acute cholecystitis 06/20/2013  . Chronic systolic CHF (congestive heart failure) ([place]) 08/25/2012  . Carotid arterial disease ([place])   . CAD (coronary artery disease)   . HTN (hypertension)   . Hypercholesteremia   . Chronic renal insufficiency   . Left ventricular dysfunction   . Gout   . History of TIAs   . Prostate cancer [facility])     Past Surgical History:  Procedure Laterality Date  .  BACK SURGERY    . CARDIOVASCULAR STRESS TEST  04-02-08   EF 30%  . CAROTID ENDARTERECTOMY  1992, 2007   LEFT, RIGHT  . CHOLECYSTECTOMY N/A 06/22/2013   Procedure: LAPAROSCOPIC CHOLECYSTECTOMY;  Surgeon: Gayland Curry, MD;  Location: [facility];  Service: General;  Laterality: N/A;  . CORONARY ARTERY BYPASS GRAFT  [id number]  . KNEE ARTHROSCOPY     RIGHT KNEE  . RADIOACTIVE SEED IMPLANT  2005       Home Medications    Prior to Admission medications   Medication Sig Start Date End Date Taking? Authorizing Provider  allopurinol (ZYLOPRIM) 300 MG tablet TAKE 1 TABLET BY MOUTH EVERY DAY 10/31/15    Peter M Martinique, MD  amLODipine (NORVASC) 10 MG tablet Take 10 mg by mouth daily.      Historical Provider, MD  aspirin 81 MG tablet Take 81 mg by mouth daily.      Historical Provider, MD  atorvastatin (LIPITOR) 40 MG tablet Take 80 mg by mouth daily.     Historical Provider, MD  benazepril (LOTENSIN) 40 MG tablet Take 40 mg by mouth daily.      Historical Provider, MD  clopidogrel (PLAVIX) 75 MG tablet Take 1 tablet (75 mg total) by mouth daily. NEED OV. 03/16/16   Peter M Martinique, MD  ergocalciferol (VITAMIN D2) 50000 UNITS capsule Take 50,000 Units by mouth every 30 (thirty) days. Twice monthly 1st and 15th    Historical Provider, MD  hydrALAZINE (APRESOLINE) 25 MG tablet TAKE 1 TABLET BY MOUTH THREE TIMES DAILY 05/25/16   Peter M Martinique, MD  hydrochlorothiazide (HYDRODIURIL) 25 MG tablet Take 25 mg by mouth daily.    Historical Provider, MD  isosorbide mononitrate (IMDUR) 30 MG 24 hr tablet Take 1 tablet (30 mg total) by mouth 2 (two) times daily. 08/24/16   Peter M Martinique, MD  metFORMIN (GLUCOPHAGE) 500 MG tablet Take 1 tablet by mouth every evening. 10/22/14   Historical Provider, MD  methotrexate (RHEUMATREX) 2.5 MG tablet Take 2.5 mg by mouth once a week. Caution:Chemotherapy. Protect from light. 10 tablets once a week    Historical Provider, MD  metoprolol tartrate (LOPRESSOR) 25 MG tablet Take 12.5 mg by mouth 2 (two) times daily.      Historical Provider, MD  nitroGLYCERIN (NITROSTAT) 0.4 MG SL tablet Place 1 tablet (0.4 mg total) under the tongue every 5 (five) minutes as needed. 06/25/16   Peter M Martinique, MD  OVER THE COUNTER MEDICATION Take 1 tablet by mouth 2 (two) times daily. lysene    Historical Provider, MD    Family History Family History  Problem Relation Age of Onset  . Other Father 40    CEREBRAL HEMORRHAGE  . Cancer Mother 97    Social History Social History  Substance Use Topics  . Smoking status: Former Smoker    Types: Cigarettes    Quit date: 09/27/1986  .  Smokeless tobacco: Never Used  . Alcohol use No     Allergies   Patient has no known allergies.   Review of Systems Review of Systems  Constitutional: Positive for fever.  Gastrointestinal: Positive for abdominal pain, anorexia, nausea and vomiting.  Genitourinary: Negative for dysuria.  Neurological: Negative for headaches.  All other systems reviewed and are negative.    Physical Exam Updated Vital Signs BP 161/73 (BP Location: Left Arm)   Pulse 75   Temp 102.5 F (39.2 C) (Oral)   Resp 26   SpO2 99%   Physical  Exam  Constitutional: He is oriented to person, place, and time. He appears well-developed and well-nourished. No distress.  HENT:  Head: Normocephalic and atraumatic.  Mouth/Throat: Oropharynx is clear and moist.  Eyes: Conjunctivae and EOM are normal. Pupils are equal, round, and reactive to light.  Neck: Normal range of motion. Neck supple.  Cardiovascular: Normal rate, regular rhythm and intact distal pulses.   No murmur heard. Pulmonary/Chest: Effort normal and breath sounds normal. No respiratory distress. He has no wheezes. He has no rales.  Abdominal: Soft. He exhibits no distension. There is no tenderness. There is no rebound and no guarding.  No reproducible abdominal pain  Musculoskeletal: Normal range of motion. He exhibits no edema or tenderness.  Neurological: He is alert and oriented to person, place, and time.  Skin: Skin is warm and dry. No rash noted. No erythema.  Psychiatric: He has a normal mood and affect. His behavior is normal.  Nursing note and vitals reviewed.    ED Treatments / Results  Labs (all labs ordered are listed, but only abnormal results are displayed) Labs Reviewed  COMPREHENSIVE METABOLIC PANEL - Abnormal; Notable for the following:       Result Value   Glucose, Bld 140 (*)    BUN 36 (*)    Creatinine, Ser 1.69 (*)    Total Protein 6.4 (*)    AST 51 (*)    Total Bilirubin 2.0 (*)    GFR calc non Af Amer 36 (*)     GFR calc Af Amer 42 (*)    All other components within normal limits  URINALYSIS, ROUTINE W REFLEX MICROSCOPIC - Abnormal; Notable for the following:    Protein, ur 100 (*)    Bacteria, UA RARE (*)    Squamous Epithelial / LPF 0-5 (*)    All other components within normal limits  LIPASE, BLOOD  CBC  I-STAT TROPOININ, ED    EKG  EKG Interpretation  Date/Time:  Thursday September 09 2016 16:15:09 EST Ventricular Rate:  75 PR Interval:  196 QRS Duration: 116 QT Interval:  406 QTC Calculation: 453 R Axis:   61 Text Interpretation:  Sinus rhythm with occasional Premature ventricular complexes Left ventricular hypertrophy with QRS widening ST & T wave abnormality, consider inferior ischemia No significant change since last tracing Confirmed by Maryan Rued  MD, Loree Fee ([id number]) on 09/09/2016 4:49:38 PM       Radiology No results found.  Procedures Procedures (including critical care time)  Medications Ordered in ED Medications  acetaminophen (TYLENOL) tablet 650 mg (not administered)     Initial Impression / Assessment and Plan / ED Course  I have reviewed the triage vital signs and the nursing notes.  Pertinent labs & imaging results that were available during my care of the patient were reviewed by me and considered in my medical decision making (see chart for details).  Clinical Course    Patient is an 82 year old male presenting today with intermittent abdominal pain, fever and vomiting. Patient has no reproducible abdominal pain on exam currently. He is not spitting any signs of discomfort or peritoneal signs. Status post cholecystectomy with low suspicion for pancreatitis or appendicitis. Symptoms are not suggestive of mesenteric ischemia. Symptoms are not suggestive of cardiac as patient has no chest pain or shortness of breath and states with all of his prior cardiac issues he's always had chest pain and shortness of breath. He denies any urinary symptoms or URI  symptoms. Concern the patient may have a viral  illness. Will give Tylenol and Zofran. Labs pending. EKG pending.  8:19 PM Labs showning mild dehydration but o/w wnl.  Patient continues to have no reproducible abdominal pain. No evidence of UTI. EKG is unchanged from prior. Patient is not currently displaying signs of an acute abdomen. Discussed strict return precautions and patient was discharged home with his wife.  Final Clinical Impressions(s) / ED Diagnoses   Final diagnoses:  Nausea vomiting and diarrhea    New Prescriptions New Prescriptions   ONDANSETRON (ZOFRAN ODT) 4 MG DISINTEGRATING TABLET    Take 1 tablet (4 mg total) by mouth every 8 (eight) hours as needed for nausea or vomiting.     Blanchie Dessert, MD 09/09/16 2021

## 2016-09-09 NOTE — ED Triage Notes (Signed)
Pt here for N/V and epigastric pain with diaphoresis; pt sts pain is intermittent; pt sts resolved after taking ASA

## 2016-09-13 DIAGNOSIS — R05 Cough: Secondary | ICD-10-CM | POA: Diagnosis not present

## 2016-09-13 DIAGNOSIS — H6012 Cellulitis of left external ear: Secondary | ICD-10-CM | POA: Diagnosis not present

## 2016-09-14 DIAGNOSIS — J189 Pneumonia, unspecified organism: Secondary | ICD-10-CM | POA: Diagnosis not present

## 2016-09-30 DIAGNOSIS — L929 Granulomatous disorder of the skin and subcutaneous tissue, unspecified: Secondary | ICD-10-CM | POA: Diagnosis not present

## 2016-10-15 DIAGNOSIS — R918 Other nonspecific abnormal finding of lung field: Secondary | ICD-10-CM | POA: Diagnosis not present

## 2016-10-15 DIAGNOSIS — J189 Pneumonia, unspecified organism: Secondary | ICD-10-CM | POA: Diagnosis not present

## 2016-10-25 ENCOUNTER — Other Ambulatory Visit: Payer: Self-pay | Admitting: Internal Medicine

## 2016-10-25 DIAGNOSIS — R918 Other nonspecific abnormal finding of lung field: Secondary | ICD-10-CM

## 2016-10-26 ENCOUNTER — Ambulatory Visit
Admission: RE | Admit: 2016-10-26 | Discharge: 2016-10-26 | Disposition: A | Discharge status: Home / Self Care | Payer: PPO | Source: Ambulatory Visit | Attending: Internal Medicine | Admitting: Internal Medicine

## 2016-10-26 DIAGNOSIS — J439 Emphysema, unspecified: Secondary | ICD-10-CM | POA: Diagnosis not present

## 2016-10-26 DIAGNOSIS — R918 Other nonspecific abnormal finding of lung field: Secondary | ICD-10-CM

## 2016-10-26 MED ORDER — IOPAMIDOL (ISOVUE-300) INJECTION 61%
50.0000 mL | Freq: Once | INTRAVENOUS | Status: AC | PRN
  Administered 2016-10-26: 50 mL via INTRAVENOUS

## 2016-11-28 NOTE — Progress Notes (Signed)
Cardiology Office Note    Date:  12/01/2016   ID:  [name], [name] 12-14-1931, MRN [id number]  PCP:  Thressa Sheller, MD  Cardiologist:  Pacey Willadsen Martinique, MD    History of Present Illness:  [name] [name] is a 83 y.o. male with a history of coronary disease and is status post redo coronary bypass surgery in 2007. In 2008 he was found to have occlusion of SVG to the intermediate branch and severe degeneration in SVG to RCA and distal LCx. The SVG was stented as was the native diagonal.  Cardiac catheterization in 2010 showed occlusion of the vein graft to the intermediate vessel and to the posterior descending artery. The stent in the diagonal was patent. The native LCx, intermediate, and RCA were occluded. He had a patent LIMA graft to the LAD and moderate left ventricular dysfunction. He has been treated medically.  He had an ETT which was done on 02/06/16. He walked 5 minutes with adequate HR and BP response. He had no chest pain. Ecg showed 3 mm ST depression in the inferolateral leads. This was felt to be consistent with his known disease and given lack of symptoms was not pursued further.  On follow up today he reports having a bad virus in December. High fever and N/V. Later developed PNA. Is recovering from this but still feels weak. Lost a lot of weight but is gaining it back. Sugars are ok. Denies chest pain or SOB. No edema. No TIA or CVA sx.    Past Medical History:  Diagnosis Date  . CAD (coronary artery disease)    a. s/p  MI in 1988 and CABG 1989;  b. s/p redo CABG 2007;  c. s/p PCI DES to VG->PDA and native Diag;  d. 2010 Cath: occluded VG's to RI and PDA, patent LIMA->LAD, patent stent in diag->med Rx.  . Carotid arterial disease ([place])    a. s/p bilat patch angioplasties;  b. 0000000 RICA [id number], LICA 123456  . Chronic systolic CHF (congestive heart failure) ([place])    a. 03/2008 EF 30% noted on Myoview.  . CKD (chronic kidney disease), stage III   . Gout   . History  of TIAs 2000  . HTN (hypertension)   . Hypercholesteremia   . Ischemic cardiomyopathy    a. 03/2008 EF 30% noted on Myoview.  . Prostate cancer ([place])   . Rheumatoid arthritis [facility])     Past Surgical History:  Procedure Laterality Date  . BACK SURGERY    . CARDIOVASCULAR STRESS TEST  04-02-08   EF 30%  . CAROTID ENDARTERECTOMY  1992, 2007   LEFT, RIGHT  . CHOLECYSTECTOMY N/A 06/22/2013   Procedure: LAPAROSCOPIC CHOLECYSTECTOMY;  Surgeon: Gayland Curry, MD;  Location: [facility] [facility];  Service: General;  Laterality: N/A;  . CORONARY ARTERY BYPASS GRAFT  [id number]  . KNEE ARTHROSCOPY     RIGHT KNEE  . RADIOACTIVE SEED IMPLANT  2005    Current Medications: Outpatient Medications Prior to Visit  Medication Sig Dispense Refill  . allopurinol (ZYLOPRIM) 300 MG tablet TAKE 1 TABLET BY MOUTH EVERY DAY 90 tablet 1  . amLODipine (NORVASC) 10 MG tablet Take 10 mg by mouth daily.      Marland Kitchen aspirin 81 MG tablet Take 81 mg by mouth daily.      Marland Kitchen atorvastatin (LIPITOR) 40 MG tablet Take 80 mg by mouth daily.     . benazepril (LOTENSIN) 40 MG tablet Take 40 mg by mouth daily.      Marland Kitchen  clopidogrel (PLAVIX) 75 MG tablet Take 1 tablet (75 mg total) by mouth daily. NEED OV. 90 tablet 2  . ergocalciferol (VITAMIN D2) 50000 UNITS capsule Take 50,000 Units by mouth every 30 (thirty) days. Twice monthly 1st and 15th    . hydrALAZINE (APRESOLINE) 25 MG tablet TAKE 1 TABLET BY MOUTH THREE TIMES DAILY 270 tablet 3  . hydrochlorothiazide (MICROZIDE) 12.5 MG capsule Take 12.5 mg by mouth daily.    . isosorbide mononitrate (IMDUR) 30 MG 24 hr tablet Take 1 tablet (30 mg total) by mouth 2 (two) times daily. 180 tablet 3  . metFORMIN (GLUCOPHAGE) 500 MG tablet Take 1 tablet by mouth every evening.  4  . methotrexate (RHEUMATREX) 2.5 MG tablet Take 2.5 mg by mouth once a week. Caution:Chemotherapy. Protect from light. 10 tablets once a week. Take on Mondays    . metoprolol tartrate (LOPRESSOR) 25 MG tablet Take 12.5 mg by mouth 2  (two) times daily.      . nitroGLYCERIN (NITROSTAT) 0.4 MG SL tablet Place 1 tablet (0.4 mg total) under the tongue every 5 (five) minutes as needed. 25 tablet 6  . ondansetron (ZOFRAN ODT) 4 MG disintegrating tablet Take 1 tablet (4 mg total) by mouth every 8 (eight) hours as needed for nausea or vomiting. (Patient not taking: Reported on 12/01/2016) 20 tablet 0  . OVER THE COUNTER MEDICATION Take 1 tablet by mouth 2 (two) times daily. lysene     No facility-administered medications prior to visit.      Allergies:   Patient has no known allergies.   Social History   Social History  . Marital status: Married    Spouse name: N/A  . Number of children: 3  . Years of education: N/A   Occupational History  . [employer] safety     retired   Social History Main Topics  . Smoking status: Former Smoker    Types: Cigarettes    Quit date: 09/27/1986  . Smokeless tobacco: Never Used  . Alcohol use No  . Drug use: No  . Sexual activity: Not Asked   Other Topics Concern  . None   Social History Narrative   Lives in [place] with wife.  Works at a [employer] two days/wk and does water walking 3 days/wk x 30-45 mins per session.     Family History:  The patient's family history includes Cancer (age of onset: 42) in his mother; Other (age of onset: 14) in his father.   ROS:   Please see the history of present illness.    ROS All other systems reviewed and are negative.   PHYSICAL EXAM:   VS:  BP (!) 120/50   Pulse 62   Ht 6\' 1"  (1.854 m)   Wt 177 lb 9.6 oz (80.6 kg)   BMI 23.43 kg/m    GEN: Well nourished, well developed, in no acute distress  HEENT: normal  Neck: no JVD, bilateral carotid bruits, no masses Cardiac: RRR; no murmurs, rubs, or gallops,no edema  Respiratory:  clear to auscultation bilaterally, normal work of breathing GI: soft, nontender, nondistended, + BS MS: no deformity or atrophy  Skin: warm and dry, no rash Neuro:  Alert and Oriented x 3,  Strength and sensation are intact Psych: euthymic mood, full affect  Wt Readings from Last 3 Encounters:  12/01/16 177 lb 9.6 oz (80.6 kg)  06/25/16 179 lb (81.2 kg)  02/12/16 183 lb (83 kg)      Studies/Labs Reviewed:  EKG:  EKG is not ordered today.  The ekg ordered today demonstrates N/A  Recent Labs: Labs dated 06/04/16: cholesterol 118, triglycerides 155, HDL 32, LDL 55. A1c 5.9%. BUN 26, creatinine 1.2. Other chemistries normal.  Dated 09/09/16: BUN 36, creatinine 1.69. Glucose 140, ALT 44, AST 51. T. Bili 2.0.  Lipid Panel    Component Value Date/Time   CHOL 137 11/12/2010   TRIG 151 11/12/2010   HDL 33 (A) 11/12/2010   LDLCALC 74 11/12/2010    Additional studies/ records that were reviewed today include:  none   ASSESSMENT:   1. Coronary disease status post redo CABG in 2007. LIMA graft to the LAD is patent. Other grafts are occluded. Prior stent of the first diagonal. He denies any significant chest pain. His primary complaint is of decreased energy. His last ETT was predictably abnormal based on his known anatomy. I don't think stress testing is particularly helpful in his situation. I am not inclined to pursue further work up unless he is having more angina. He is on good medical therapy.  We'll continue with Plavix, isosorbide, amlodipine, or and metoprolol. He is also on a baby aspirin daily.  2. Hypertension, BP well controlled. Continue Rx.   3. Hypercholesterolemia continue atorvastatin. Excellent control.   4. Chronic congestive heart failure with ischemic cardiomyopathy. Ejection fraction 40-45%. He is asymptomatic. No evidence of volume overload. Continue beta blocker and ACE inhibitor.  5. Carotid arterial disease status post carotid endarterectomy. Will schedule Carotid Dopplers    PLAN:     Medication Adjustments/Labs and Tests Ordered: Current medicines are reviewed at length with the patient today.  Concerns regarding medicines are outlined  above.  Medication changes, Labs and Tests ordered today are listed in the Patient Instructions below. Patient Instructions  We will schedule you for carotid dopplers.  Continue your current therapy      Signed, Darrian Goodwill Martinique, MD  12/01/2016 10:13 AM    [facility] [address], [place], [place], [address] [phone]

## 2016-12-01 ENCOUNTER — Ambulatory Visit (INDEPENDENT_AMBULATORY_CARE_PROVIDER_SITE_OTHER): Payer: PPO | Admitting: Cardiology

## 2016-12-01 ENCOUNTER — Encounter: Payer: Self-pay | Admitting: Cardiology

## 2016-12-01 VITALS — BP 120/50 | HR 62 | Ht 73.0 in | Wt 177.6 lb

## 2016-12-01 DIAGNOSIS — I5022 Chronic systolic (congestive) heart failure: Secondary | ICD-10-CM

## 2016-12-01 DIAGNOSIS — I739 Peripheral vascular disease, unspecified: Principal | ICD-10-CM

## 2016-12-01 DIAGNOSIS — I10 Essential (primary) hypertension: Secondary | ICD-10-CM

## 2016-12-01 DIAGNOSIS — I779 Disorder of arteries and arterioles, unspecified: Secondary | ICD-10-CM | POA: Diagnosis not present

## 2016-12-01 DIAGNOSIS — I25708 Atherosclerosis of coronary artery bypass graft(s), unspecified, with other forms of angina pectoris: Secondary | ICD-10-CM | POA: Diagnosis not present

## 2016-12-01 DIAGNOSIS — E7800 Pure hypercholesterolemia, unspecified: Secondary | ICD-10-CM | POA: Diagnosis not present

## 2016-12-01 NOTE — Patient Instructions (Signed)
We will schedule you for carotid dopplers.  Continue your current therapy  

## 2016-12-03 ENCOUNTER — Ambulatory Visit (HOSPITAL_COMMUNITY)
Admission: RE | Admit: 2016-12-03 | Discharge: 2016-12-03 | Disposition: A | Discharge status: Home / Self Care | Payer: PPO | Source: Ambulatory Visit | Attending: Cardiology | Admitting: Cardiology

## 2016-12-03 DIAGNOSIS — Z87891 Personal history of nicotine dependence: Secondary | ICD-10-CM | POA: Insufficient documentation

## 2016-12-03 DIAGNOSIS — Z951 Presence of aortocoronary bypass graft: Secondary | ICD-10-CM | POA: Insufficient documentation

## 2016-12-03 DIAGNOSIS — I10 Essential (primary) hypertension: Secondary | ICD-10-CM

## 2016-12-03 DIAGNOSIS — E7800 Pure hypercholesterolemia, unspecified: Secondary | ICD-10-CM

## 2016-12-03 DIAGNOSIS — I499 Cardiac arrhythmia, unspecified: Secondary | ICD-10-CM | POA: Insufficient documentation

## 2016-12-03 DIAGNOSIS — I5022 Chronic systolic (congestive) heart failure: Secondary | ICD-10-CM | POA: Diagnosis not present

## 2016-12-03 DIAGNOSIS — Z8673 Personal history of transient ischemic attack (TIA), and cerebral infarction without residual deficits: Secondary | ICD-10-CM | POA: Insufficient documentation

## 2016-12-03 DIAGNOSIS — E1122 Type 2 diabetes mellitus with diabetic chronic kidney disease: Secondary | ICD-10-CM | POA: Diagnosis not present

## 2016-12-03 DIAGNOSIS — I110 Hypertensive heart disease with heart failure: Secondary | ICD-10-CM | POA: Insufficient documentation

## 2016-12-03 DIAGNOSIS — E559 Vitamin D deficiency, unspecified: Secondary | ICD-10-CM | POA: Diagnosis not present

## 2016-12-03 DIAGNOSIS — M109 Gout, unspecified: Secondary | ICD-10-CM | POA: Diagnosis not present

## 2016-12-03 DIAGNOSIS — I779 Disorder of arteries and arterioles, unspecified: Secondary | ICD-10-CM | POA: Diagnosis not present

## 2016-12-03 DIAGNOSIS — I25708 Atherosclerosis of coronary artery bypass graft(s), unspecified, with other forms of angina pectoris: Secondary | ICD-10-CM

## 2016-12-03 DIAGNOSIS — I6523 Occlusion and stenosis of bilateral carotid arteries: Secondary | ICD-10-CM | POA: Diagnosis not present

## 2016-12-03 DIAGNOSIS — I129 Hypertensive chronic kidney disease with stage 1 through stage 4 chronic kidney disease, or unspecified chronic kidney disease: Secondary | ICD-10-CM | POA: Diagnosis not present

## 2016-12-03 DIAGNOSIS — I739 Peripheral vascular disease, unspecified: Secondary | ICD-10-CM

## 2016-12-07 ENCOUNTER — Other Ambulatory Visit: Payer: Self-pay

## 2016-12-07 MED ORDER — CLOPIDOGREL BISULFATE 75 MG PO TABS: 75.0000 mg | ORAL_TABLET | Freq: Every day | ORAL | 2 refills | Status: DC

## 2016-12-08 DIAGNOSIS — C44619 Basal cell carcinoma of skin of left upper limb, including shoulder: Secondary | ICD-10-CM | POA: Diagnosis not present

## 2016-12-08 DIAGNOSIS — Z85828 Personal history of other malignant neoplasm of skin: Secondary | ICD-10-CM | POA: Diagnosis not present

## 2016-12-08 DIAGNOSIS — L814 Other melanin hyperpigmentation: Secondary | ICD-10-CM | POA: Diagnosis not present

## 2016-12-08 DIAGNOSIS — L570 Actinic keratosis: Secondary | ICD-10-CM | POA: Diagnosis not present

## 2016-12-08 DIAGNOSIS — D485 Neoplasm of uncertain behavior of skin: Secondary | ICD-10-CM | POA: Diagnosis not present

## 2016-12-08 DIAGNOSIS — D1801 Hemangioma of skin and subcutaneous tissue: Secondary | ICD-10-CM | POA: Diagnosis not present

## 2016-12-08 DIAGNOSIS — L821 Other seborrheic keratosis: Secondary | ICD-10-CM | POA: Diagnosis not present

## 2016-12-10 DIAGNOSIS — N182 Chronic kidney disease, stage 2 (mild): Secondary | ICD-10-CM | POA: Diagnosis not present

## 2016-12-10 DIAGNOSIS — E1122 Type 2 diabetes mellitus with diabetic chronic kidney disease: Secondary | ICD-10-CM | POA: Diagnosis not present

## 2016-12-10 DIAGNOSIS — I2510 Atherosclerotic heart disease of native coronary artery without angina pectoris: Secondary | ICD-10-CM | POA: Diagnosis not present

## 2016-12-15 DIAGNOSIS — C44619 Basal cell carcinoma of skin of left upper limb, including shoulder: Secondary | ICD-10-CM | POA: Diagnosis not present

## 2016-12-15 DIAGNOSIS — L570 Actinic keratosis: Secondary | ICD-10-CM | POA: Diagnosis not present

## 2017-01-12 DIAGNOSIS — E119 Type 2 diabetes mellitus without complications: Secondary | ICD-10-CM | POA: Diagnosis not present

## 2017-01-12 DIAGNOSIS — H35352 Cystoid macular degeneration, left eye: Secondary | ICD-10-CM | POA: Diagnosis not present

## 2017-01-12 DIAGNOSIS — Z961 Presence of intraocular lens: Secondary | ICD-10-CM | POA: Diagnosis not present

## 2017-01-12 DIAGNOSIS — H2511 Age-related nuclear cataract, right eye: Secondary | ICD-10-CM | POA: Diagnosis not present

## 2017-01-12 DIAGNOSIS — H35372 Puckering of macula, left eye: Secondary | ICD-10-CM | POA: Diagnosis not present

## 2017-01-26 DIAGNOSIS — H2511 Age-related nuclear cataract, right eye: Secondary | ICD-10-CM | POA: Diagnosis not present

## 2017-01-26 DIAGNOSIS — H35372 Puckering of macula, left eye: Secondary | ICD-10-CM | POA: Diagnosis not present

## 2017-01-26 DIAGNOSIS — Z961 Presence of intraocular lens: Secondary | ICD-10-CM | POA: Diagnosis not present

## 2017-01-26 DIAGNOSIS — H353132 Nonexudative age-related macular degeneration, bilateral, intermediate dry stage: Secondary | ICD-10-CM | POA: Diagnosis not present

## 2017-01-28 DIAGNOSIS — Z8546 Personal history of malignant neoplasm of prostate: Secondary | ICD-10-CM | POA: Diagnosis not present

## 2017-02-04 DIAGNOSIS — Z8546 Personal history of malignant neoplasm of prostate: Secondary | ICD-10-CM | POA: Diagnosis not present

## 2017-02-16 ENCOUNTER — Ambulatory Visit (INDEPENDENT_AMBULATORY_CARE_PROVIDER_SITE_OTHER): Payer: PPO

## 2017-02-16 ENCOUNTER — Ambulatory Visit (INDEPENDENT_AMBULATORY_CARE_PROVIDER_SITE_OTHER): Payer: PPO | Admitting: Orthopaedic Surgery

## 2017-02-16 DIAGNOSIS — M25561 Pain in right knee: Secondary | ICD-10-CM

## 2017-02-16 DIAGNOSIS — G8929 Other chronic pain: Secondary | ICD-10-CM

## 2017-02-16 DIAGNOSIS — M1711 Unilateral primary osteoarthritis, right knee: Secondary | ICD-10-CM

## 2017-02-16 MED ORDER — METHYLPREDNISOLONE ACETATE 40 MG/ML IJ SUSP
40.0000 mg | INTRAMUSCULAR | Status: AC | PRN
  Administered 2017-02-16: 40 mg via INTRA_ARTICULAR

## 2017-02-16 MED ORDER — LIDOCAINE HCL 1 % IJ SOLN
3.0000 mL | INTRAMUSCULAR | Status: AC | PRN
Start: 2017-02-16 — End: 2017-02-16
  Administered 2017-02-16: 3 mL

## 2017-02-16 NOTE — Progress Notes (Signed)
Office Visit Note   Patient: [name] [name]           Date of Birth: 1932-04-06           MRN: [id number] Visit Date: 02/16/2017              Requested by: Thressa Sheller, [place], [place], [place] [address] PCP: System, Pcp Not In   Assessment & Plan: Visit Diagnoses:  1. Chronic pain of right knee   2. Unilateral primary osteoarthritis, right knee     Plan: We're to try to stay as conservative as possible. The in gain would be a medial compartment partial knee replacement. Today I will aspirate fluid from his knee and place a steroid injection and see him back in 4 weeks for a hyaluronic acid injection. The risks and benefits of this in detail he does wish to proceed with this treatment plan.  Follow-Up Instructions: Return in about 4 weeks (around 03/16/2017).   Orders:  Orders Placed This Encounter  Procedures  . Large Joint Injection/Arthrocentesis  . XR Knee 1-2 Views Right   No orders of the defined types were placed in this encounter.     Procedures: Large Joint Inj Date/Time: 02/16/2017 9:20 AM Performed by: Mcarthur Rossetti Authorized by: Mcarthur Rossetti   Location:  Knee Site:  R knee Ultrasound Guidance: No   Fluoroscopic Guidance: No   Arthrogram: No   Medications:  3 mL lidocaine 1 %; 40 mg methylPREDNISolone acetate 40 MG/ML     Clinical Data: No additional findings.   Subjective: No chief complaint on file. Patient is an 83 year old gentleman that in 2007 performed arthroscopic knee surgery on his right knee. This is due to new compartment arthritic changes and large medial meniscal tear. He is actually done well for over 10 years now is been having knee pain over the last several months specialty going up and stairs. Is been right knee pain with swelling. It hurts going up and down hills as well. On flat surfaces he doesn't have an issue. He does not have issue playing golf either. He still very active  and young appearing individual. His pain is daily though in the knee has been swelling. There is been no locking catching. We actually performed chondroplasty and microfracture on the medial femoral condyle back in 2007.  HPI  Review of Systems He denies any headache, chest pain, nausea, vomiting, fever, chills.  Objective: Vital Signs: There were no vitals taken for this visit.  Physical Exam He is alert and oriented 3 and in no acute distress Ortho Exam Examination of his right knee shows a moderate effusion. There is medial joint line tenderness. His ligamentous exam is stable. He has varus malalignment is easily correctable. There is no lateral pain and no significant patellofemoral crepitation. Specialty Comments:  No specialty comments available.  Imaging: Xr Knee 1-2 Views Right  Result Date: 02/16/2017 2 views of the right knee show varus malalignment involving mainly the medial compartment with moderate to severe arthritic changes of the medial compartment only. There is minimal patellofemoral joint disease.    PMFS History: Patient Active Problem List   Diagnosis Date Noted  . Chronic pain of right knee 02/16/2017  . Unilateral primary osteoarthritis, right knee 02/16/2017  . Unspecified constipation 06/24/2013  . Acute renal failure ([place]) 06/21/2013  . Acute cholecystitis 06/20/2013  . Chronic systolic CHF (congestive heart failure) ([place]) 08/25/2012  . Carotid arterial disease ([place])   .  CAD (coronary artery disease)   . HTN (hypertension)   . Hypercholesteremia   . Chronic renal insufficiency   . Left ventricular dysfunction   . Gout   . History of TIAs   . Prostate cancer [facility])    Past Medical History:  Diagnosis Date  . CAD (coronary artery disease)    a. s/p  MI in 1988 and CABG 1989;  b. s/p redo CABG 2007;  c. s/p PCI DES to VG->PDA and native Diag;  d. 2010 Cath: occluded VG's to RI and PDA, patent LIMA->LAD, patent stent in diag->med Rx.  . Carotid  arterial disease ([place])    a. s/p bilat patch angioplasties;  b. 09/5398 RICA 8-67%, LICA 61-95%  . Chronic systolic CHF (congestive heart failure) ([place])    a. 03/2008 EF 30% noted on Myoview.  . CKD (chronic kidney disease), stage III   . Gout   . History of TIAs 2000  . HTN (hypertension)   . Hypercholesteremia   . Ischemic cardiomyopathy    a. 03/2008 EF 30% noted on Myoview.  . Prostate cancer ([place])   . Rheumatoid arthritis ([place])     Family History  Problem Relation Age of Onset  . Other Father 90       CEREBRAL HEMORRHAGE  . Cancer Mother 36    Past Surgical History:  Procedure Laterality Date  . BACK SURGERY    . CARDIOVASCULAR STRESS TEST  04-02-08   EF 30%  . CAROTID ENDARTERECTOMY  1992, 2007   LEFT, RIGHT  . CHOLECYSTECTOMY N/A 06/22/2013   Procedure: LAPAROSCOPIC CHOLECYSTECTOMY;  Surgeon: Gayland Curry, MD;  Location: [facility];  Service: General;  Laterality: N/A;  . CORONARY ARTERY BYPASS GRAFT  [phone]  . KNEE ARTHROSCOPY     RIGHT KNEE  . RADIOACTIVE SEED IMPLANT  2005   Social History   Occupational History  . Insurance company safety     retired   Social History Main Topics  . Smoking status: Former Smoker    Types: Cigarettes    Quit date: 09/27/1986  . Smokeless tobacco: Never Used  . Alcohol use No  . Drug use: No  . Sexual activity: Not on file

## 2017-03-10 ENCOUNTER — Telehealth: Payer: Self-pay | Admitting: Physician Assistant

## 2017-03-10 DIAGNOSIS — R1010 Upper abdominal pain, unspecified: Secondary | ICD-10-CM | POA: Diagnosis not present

## 2017-03-10 DIAGNOSIS — N390 Urinary tract infection, site not specified: Secondary | ICD-10-CM | POA: Diagnosis not present

## 2017-03-10 DIAGNOSIS — I2510 Atherosclerotic heart disease of native coronary artery without angina pectoris: Secondary | ICD-10-CM | POA: Diagnosis not present

## 2017-03-10 NOTE — Telephone Encounter (Signed)
Received records from [facility] for appointment on 03/11/17 with Almyra Deforest, PA.  Records put with Hao's schedule for 03/11/17. lp

## 2017-03-11 ENCOUNTER — Encounter: Payer: Self-pay | Admitting: Physician Assistant

## 2017-03-11 ENCOUNTER — Ambulatory Visit (INDEPENDENT_AMBULATORY_CARE_PROVIDER_SITE_OTHER): Payer: PPO | Admitting: Physician Assistant

## 2017-03-11 ENCOUNTER — Other Ambulatory Visit: Payer: Self-pay | Admitting: Internal Medicine

## 2017-03-11 VITALS — BP 116/54 | HR 48 | Ht 73.0 in | Wt 174.4 lb

## 2017-03-11 DIAGNOSIS — I10 Essential (primary) hypertension: Secondary | ICD-10-CM | POA: Diagnosis not present

## 2017-03-11 DIAGNOSIS — R0781 Pleurodynia: Secondary | ICD-10-CM | POA: Diagnosis not present

## 2017-03-11 DIAGNOSIS — R011 Cardiac murmur, unspecified: Secondary | ICD-10-CM | POA: Diagnosis not present

## 2017-03-11 DIAGNOSIS — I25810 Atherosclerosis of coronary artery bypass graft(s) without angina pectoris: Secondary | ICD-10-CM | POA: Diagnosis not present

## 2017-03-11 DIAGNOSIS — E785 Hyperlipidemia, unspecified: Secondary | ICD-10-CM | POA: Diagnosis not present

## 2017-03-11 DIAGNOSIS — R001 Bradycardia, unspecified: Secondary | ICD-10-CM | POA: Diagnosis not present

## 2017-03-11 DIAGNOSIS — R1010 Upper abdominal pain, unspecified: Secondary | ICD-10-CM

## 2017-03-11 MED ORDER — PANTOPRAZOLE SODIUM 40 MG PO TBEC: 40.0000 mg | DELAYED_RELEASE_TABLET | Freq: Every day | ORAL | 6 refills | Status: DC

## 2017-03-11 NOTE — Patient Instructions (Signed)
SCHEDULE AT [facility] has requested that you have an echocardiogram. Echocardiography is a painless test that uses sound waves to create images of your heart. It provides your doctor with information about the size and shape of your heart and how well your heart's chambers and valves are working. This procedure takes approximately one hour. There are no restrictions for this procedure.     MEDICATION START PANTOPRAZOLE 40 MG ONE TABLET IN THE MORNINGS    Your physician recommends that you schedule a follow-up appointment in 2-3 [place]

## 2017-03-11 NOTE — Progress Notes (Signed)
Cardiology Office Note    Date:  03/12/2017   ID:  [name] [name], DOB 17-Apr-1932, MRN [id number]  PCP:  System, Pcp Not In  Cardiologist:  Dr. Martinique  Chief Complaint  Patient presents with  . Follow-up    seen for Dr. Martinique.    History of Present Illness:  [name] [name] is a 83 y.o. male with PMH of CAD s/p redo CABG in 2007, CKD stage III, h/o TIA, HTN, HLD, h/o ICM with baseline EF 40-45%. He had a cardiac catheterization in 2008 which found to have occlusion of SVG to intermediate branch and the severe degeneration of SVG to RCA and the distal left circumflex. The SVG was stented as well as the native diagonal. Cardiac catheterization in 2010 showed occlusion of the vein graft to intermediate vessel and the true posterior descending artery. Stent in the diagonal artery was patent. Native left circumflex, intermediate and RCA were occluded. He had patent LIMA to LAD. Last echocardiogram obtained on 06/21/2013 showed EF 40-45%, akinesis of the mid anteroseptal myocardium, akinesis of the basal inferior myocardium. He had a ETT done on 02/06/2016, he walked 5 minutes with adequate heart rate and BP response, EKG showed 3 mm ST depression in the inferolateral leads, this was felt to be consistent with his known disease and given lack of symptom, this was not pursued any further. He was last seen by Dr. Martinique on 12/01/2016. Carotid ultrasound was repeated, this showed no significant blockage.  He was recently seen by his primary care physician was concerned of possible angina. He says in the past week, he had 3 episode of epigastric pain radiating under his lower ribs. He says the symptom mainly occurred after meals. This is also different from his previous angina. There is no obvious correlation with exertion recently. He has not played golf for the past 2 weeks either. The symptom sounds atypical, we decided to start with a trial of Protonix for the next 2-3 weeks. If his symptom is  still not controlled on follow-up, then I would recommend a stress test. On physical exam, he also have a murmur as well, I do not see any significant elevation on the previous echocardiogram. I recommended repeating the echocardiogram, but I do not think that her murmur is responsible for the recent symptom.   Past Medical History:  Diagnosis Date  . CAD (coronary artery disease)    a. s/p  MI in 1988 and CABG 1989;  b. s/p redo CABG 2007;  c. s/p PCI DES to VG->PDA and native Diag;  d. 2010 Cath: occluded VG's to RI and PDA, patent LIMA->LAD, patent stent in diag->med Rx.  . Carotid arterial disease ([place])    a. s/p bilat patch angioplasties;  b. 01/1883 RICA 1-66%, LICA 06-30%  . Chronic systolic CHF (congestive heart failure) ([place])    a. 03/2008 EF 30% noted on Myoview.  . CKD (chronic kidney disease), stage III   . Gout   . History of TIAs 2000  . HTN (hypertension)   . Hypercholesteremia   . Ischemic cardiomyopathy    a. 03/2008 EF 30% noted on Myoview.  . Prostate cancer ([place])   . Rheumatoid arthritis [facility])     Past Surgical History:  Procedure Laterality Date  . BACK SURGERY    . CARDIOVASCULAR STRESS TEST  04-02-08   EF 30%  . CAROTID ENDARTERECTOMY  1992, 2007   LEFT, RIGHT  . CHOLECYSTECTOMY N/A 06/22/2013   Procedure: LAPAROSCOPIC  CHOLECYSTECTOMY;  Surgeon: Gayland Curry, MD;  Location: [facility];  Service: General;  Laterality: N/A;  . CORONARY ARTERY BYPASS GRAFT  [phone]  . KNEE ARTHROSCOPY     RIGHT KNEE  . RADIOACTIVE SEED IMPLANT  2005    Current Medications: Outpatient Medications Prior to Visit  Medication Sig Dispense Refill  . allopurinol (ZYLOPRIM) 300 MG tablet TAKE 1 TABLET BY MOUTH EVERY DAY 90 tablet 1  . amLODipine (NORVASC) 10 MG tablet Take 10 mg by mouth daily.      Marland Kitchen aspirin 81 MG tablet Take 81 mg by mouth daily.      Marland Kitchen atorvastatin (LIPITOR) 40 MG tablet Take 80 mg by mouth daily.     . benazepril (LOTENSIN) 40 MG tablet Take 40 mg by mouth daily.       . clopidogrel (PLAVIX) 75 MG tablet Take 1 tablet (75 mg total) by mouth daily. NEED OV. 90 tablet 2  . ergocalciferol (VITAMIN D2) 50000 UNITS capsule Take 50,000 Units by mouth every 30 (thirty) days. Twice monthly 1st and 15th    . hydrALAZINE (APRESOLINE) 25 MG tablet TAKE 1 TABLET BY MOUTH THREE TIMES DAILY 270 tablet 3  . hydrochlorothiazide (MICROZIDE) 12.5 MG capsule Take 12.5 mg by mouth daily.    . isosorbide mononitrate (IMDUR) 30 MG 24 hr tablet Take 1 tablet (30 mg total) by mouth 2 (two) times daily. 180 tablet 3  . metFORMIN (GLUCOPHAGE) 500 MG tablet Take 1 tablet by mouth every evening.  4  . methotrexate (RHEUMATREX) 2.5 MG tablet Take 2.5 mg by mouth once a week. Caution:Chemotherapy. Protect from light. 10 tablets once a week. Take on Mondays    . metoprolol tartrate (LOPRESSOR) 25 MG tablet Take 12.5 mg by mouth 2 (two) times daily.      . nitroGLYCERIN (NITROSTAT) 0.4 MG SL tablet Place 1 tablet (0.4 mg total) under the tongue every 5 (five) minutes as needed. 25 tablet 6   No facility-administered medications prior to visit.      Allergies:   Patient has no known allergies.   Social History   Social History  . Marital status: Married    Spouse name: N/A  . Number of children: 3  . Years of education: N/A   Occupational History  . Insurance company safety     retired   Social History Main Topics  . Smoking status: Former Smoker    Types: Cigarettes    Quit date: 09/27/1986  . Smokeless tobacco: Never Used  . Alcohol use No  . Drug use: No  . Sexual activity: Not Asked   Other Topics Concern  . None   Social History Narrative   Lives in [place] with wife.  Works at a [employer] two days/wk and does water walking 3 days/wk x 30-45 mins per session.     Family History:  The patient's family history includes Cancer (age of onset: 28) in his mother; Other (age of onset: 74) in his father.   ROS:   Please see the history of present illness.      ROS All other systems reviewed and are negative.   PHYSICAL EXAM:   VS:  BP (!) 116/54 (BP Location: Left Arm)   Pulse (!) 48   Ht 6\' 1"  (1.854 m)   Wt 174 lb 6.4 oz (79.1 kg)   SpO2 97%   BMI 23.01 kg/m    GEN: Well nourished, well developed, in no acute distress  HEENT: normal  Neck: no JVD, carotid bruits, or masses Cardiac: RRR; no rubs, or gallops,no edema  2/6 systolic murmur Respiratory:  clear to auscultation bilaterally, normal work of breathing GI: soft, nontender, nondistended, + BS MS: no deformity or atrophy  Skin: warm and dry, no rash Neuro:  Alert and Oriented x 3, Strength and sensation are intact Psych: euthymic mood, full affect  Wt Readings from Last 3 Encounters:  03/11/17 174 lb 6.4 oz (79.1 kg)  12/01/16 177 lb 9.6 oz (80.6 kg)  06/25/16 179 lb (81.2 kg)      Studies/Labs Reviewed:   EKG:  EKG is not ordered today.    Recent Labs: 09/09/2016: ALT 44; BUN 36; Creatinine, Ser 1.69; Hemoglobin 13.9; Platelets 282; Potassium 4.2; Sodium 140   Lipid Panel    Component Value Date/Time   CHOL 137 11/12/2010   TRIG 151 11/12/2010   HDL 33 (A) 11/12/2010   [address] 74 11/12/2010    Additional studies/ records that were reviewed today include:  Cath 01/26/2011  FINAL INTERPRETATION:  1. Severe three-vessel obstructive coronary artery disease.  2. Long-term patent stent in the first diagonal branch.  3. Patent left internal mammary artery graft to the left anterior      descending.  4. Occluded vein grafts to the intermediate and posterior descending      artery branches.  5. Moderate left ventricular dysfunction.   PLAN:  There is no significant change compared to his prior cardiac  catheterization of 1 year prior.  We will continue medical therapy.    Echo 06/21/2013 LV EF: 40% -  45%  Study Conclusions  Left ventricle: The cavity size was mildly dilated. Systolic function was mildly to moderately reduced. The estimated ejection  fraction was in the range of 40% to 45%. Akinesis of the midanteroseptal myocardium. Akinesis of the basalinferior myocardium. There was an increased relative contribution of atrial contraction to ventricular filling.   ASSESSMENT:    1. Rib pain   2. Murmur   3. Coronary artery disease involving coronary bypass graft of native heart without angina pectoris   4. Essential hypertension   5. Hyperlipidemia, unspecified hyperlipidemia type   6. Bradycardia      PLAN:  In order of problems listed above:  1. Rib pain: Occurring up to 5 minutes for 3 times last week. He says this is different from his usual angina. There is no obvious correlation with exertion. All 3 times occurred after meal and at rest. I recommended a trial Protonix for now to see if there is any change in the symptom. I will bring him back in 2-3 weeks for reassessment, if his symptom is still present, then we will pursue a stress test.  2. Heart murmur: located in the aortic valve area, no obvious valve a shoe on the previous echocardiogram. I recommended a repeat echo.  3. CAD s/p CABG: Continue on current therapy. He is on aspirin and Plavix. He is recent symptom is atypical, I would favor initiating medical therapy first before considering Myoview.  4. Hypertension: Blood pressure stable on today's physical exam.  5. Hyperlipidemia: Continue Lipitor  6. Chronic bradycardia: On the lowest possible dose of metoprolol. His heart rate has always been in the 40s. He denies any dizziness with exertion. The only time he has dizziness is when he gets up too quickly    Medication Adjustments/Labs and Tests Ordered: Current medicines are reviewed at length with the patient today.  Concerns regarding medicines are outlined above.  Medication changes, Labs and Tests ordered today are listed in the Patient Instructions below. Patient Instructions  SCHEDULE AT [place] Your physician has  requested that you have an echocardiogram. Echocardiography is a painless test that uses sound waves to create images of your heart. It provides your doctor with information about the size and shape of your heart and how well your heart's chambers and valves are working. This procedure takes approximately one hour. There are no restrictions for this procedure.     MEDICATION START PANTOPRAZOLE 40 MG ONE TABLET IN THE MORNINGS    Your physician recommends that you schedule a follow-up appointment in 2-3 [place]      Signed, Almyra Deforest, [place]  03/12/2017 3:20 PM    [facility] [address], [place], [place]  [address] Phone: [phone]; Fax: [phone]

## 2017-03-12 ENCOUNTER — Encounter: Payer: Self-pay | Admitting: Physician Assistant

## 2017-03-16 ENCOUNTER — Encounter (INDEPENDENT_AMBULATORY_CARE_PROVIDER_SITE_OTHER): Payer: Self-pay | Admitting: Orthopaedic Surgery

## 2017-03-16 ENCOUNTER — Ambulatory Visit (INDEPENDENT_AMBULATORY_CARE_PROVIDER_SITE_OTHER): Payer: PPO | Admitting: Physician Assistant

## 2017-03-16 ENCOUNTER — Other Ambulatory Visit: Payer: PPO

## 2017-03-16 VITALS — Ht 73.0 in | Wt 174.0 lb

## 2017-03-16 DIAGNOSIS — M1711 Unilateral primary osteoarthritis, right knee: Secondary | ICD-10-CM | POA: Diagnosis not present

## 2017-03-16 MED ORDER — HYALURONAN 88 MG/4ML IX SOSY
88.0000 mg | PREFILLED_SYRINGE | INTRA_ARTICULAR | Status: AC | PRN
  Administered 2017-03-16: 88 mg via INTRA_ARTICULAR

## 2017-03-16 NOTE — Progress Notes (Signed)
   Procedure Note  Patient: [name] [name]             Date of Birth: 04/15/1932           MRN: [id number]             Visit Date: 03/16/2017 History of present illness [name] returns today follow-up of his right knee states is needed overall is better since the injection. He states he had some pain with the injection initially but this seemed went away.  Right knee good range of motion without pain. No instability valgus varus stressing no effusion abnormal warmth or erythema.  Procedures: Visit Diagnoses: Unilateral primary osteoarthritis, right knee  Large Joint Inj Date/Time: 03/16/2017 9:04 AM Performed by: Pete Pelt Authorized by: Pete Pelt   Consent Given by:  Patient Indications:  Pain Location:  Knee Site:  R knee Needle Size:  22 G Needle Length:  3.5 inches Approach:  Anterolateral Ultrasound Guidance: No   Fluoroscopic Guidance: No   Medications:  88 mg Hyaluronan 88 MG/4ML Aspiration Attempted: No   Patient tolerance of procedure: Some stiffness and discomfort after the injection. Patient waited until this improved.   Plan : He'll ice the knee today 1520 minutes. Resume activities as tolerated tomorrow. Follow-up with [place] in 8 weeks' check his progress lack of.

## 2017-03-18 ENCOUNTER — Ambulatory Visit
Admission: RE | Admit: 2017-03-18 | Discharge: 2017-03-18 | Disposition: A | Discharge status: Home / Self Care | Payer: PPO | Source: Ambulatory Visit | Attending: Internal Medicine | Admitting: Internal Medicine

## 2017-03-18 DIAGNOSIS — N281 Cyst of kidney, acquired: Secondary | ICD-10-CM | POA: Diagnosis not present

## 2017-03-18 DIAGNOSIS — E279 Disorder of adrenal gland, unspecified: Secondary | ICD-10-CM | POA: Diagnosis not present

## 2017-03-18 DIAGNOSIS — R1010 Upper abdominal pain, unspecified: Secondary | ICD-10-CM

## 2017-03-18 MED ORDER — IOPAMIDOL (ISOVUE-300) INJECTION 61%
100.0000 mL | Freq: Once | INTRAVENOUS | Status: AC | PRN
  Administered 2017-03-18: 100 mL via INTRAVENOUS

## 2017-03-21 ENCOUNTER — Ambulatory Visit: Payer: PPO | Admitting: Physician Assistant

## 2017-03-25 ENCOUNTER — Other Ambulatory Visit (HOSPITAL_COMMUNITY): Payer: PPO

## 2017-04-14 DIAGNOSIS — R05 Cough: Secondary | ICD-10-CM | POA: Diagnosis not present

## 2017-04-15 ENCOUNTER — Ambulatory Visit: Payer: PPO | Admitting: Physician Assistant

## 2017-05-08 DIAGNOSIS — R05 Cough: Secondary | ICD-10-CM | POA: Diagnosis not present

## 2017-05-11 ENCOUNTER — Encounter: Payer: Self-pay | Admitting: Cardiology

## 2017-05-11 DIAGNOSIS — E1122 Type 2 diabetes mellitus with diabetic chronic kidney disease: Secondary | ICD-10-CM | POA: Diagnosis not present

## 2017-05-11 DIAGNOSIS — I2510 Atherosclerotic heart disease of native coronary artery without angina pectoris: Secondary | ICD-10-CM | POA: Diagnosis not present

## 2017-05-11 DIAGNOSIS — Z0000 Encounter for general adult medical examination without abnormal findings: Secondary | ICD-10-CM | POA: Diagnosis not present

## 2017-05-11 DIAGNOSIS — J069 Acute upper respiratory infection, unspecified: Secondary | ICD-10-CM | POA: Diagnosis not present

## 2017-05-11 DIAGNOSIS — N182 Chronic kidney disease, stage 2 (mild): Secondary | ICD-10-CM | POA: Diagnosis not present

## 2017-05-17 DIAGNOSIS — M0589 Other rheumatoid arthritis with rheumatoid factor of multiple sites: Secondary | ICD-10-CM | POA: Diagnosis not present

## 2017-05-17 DIAGNOSIS — Z79899 Other long term (current) drug therapy: Secondary | ICD-10-CM | POA: Diagnosis not present

## 2017-05-17 DIAGNOSIS — M1A09X0 Idiopathic chronic gout, multiple sites, without tophus (tophi): Secondary | ICD-10-CM | POA: Diagnosis not present

## 2017-05-17 DIAGNOSIS — M779 Enthesopathy, unspecified: Secondary | ICD-10-CM | POA: Diagnosis not present

## 2017-05-17 DIAGNOSIS — R748 Abnormal levels of other serum enzymes: Secondary | ICD-10-CM | POA: Diagnosis not present

## 2017-05-18 ENCOUNTER — Ambulatory Visit (INDEPENDENT_AMBULATORY_CARE_PROVIDER_SITE_OTHER): Payer: PPO | Admitting: Orthopaedic Surgery

## 2017-05-18 DIAGNOSIS — E1122 Type 2 diabetes mellitus with diabetic chronic kidney disease: Secondary | ICD-10-CM | POA: Diagnosis not present

## 2017-05-18 DIAGNOSIS — J069 Acute upper respiratory infection, unspecified: Secondary | ICD-10-CM | POA: Diagnosis not present

## 2017-05-18 DIAGNOSIS — I5022 Chronic systolic (congestive) heart failure: Secondary | ICD-10-CM | POA: Diagnosis not present

## 2017-05-18 DIAGNOSIS — M1A09X0 Idiopathic chronic gout, multiple sites, without tophus (tophi): Secondary | ICD-10-CM | POA: Diagnosis not present

## 2017-05-18 DIAGNOSIS — M0589 Other rheumatoid arthritis with rheumatoid factor of multiple sites: Secondary | ICD-10-CM | POA: Diagnosis not present

## 2017-05-18 DIAGNOSIS — I129 Hypertensive chronic kidney disease with stage 1 through stage 4 chronic kidney disease, or unspecified chronic kidney disease: Secondary | ICD-10-CM | POA: Diagnosis not present

## 2017-05-18 DIAGNOSIS — N183 Chronic kidney disease, stage 3 (moderate): Secondary | ICD-10-CM | POA: Diagnosis not present

## 2017-05-18 DIAGNOSIS — I2510 Atherosclerotic heart disease of native coronary artery without angina pectoris: Secondary | ICD-10-CM | POA: Diagnosis not present

## 2017-05-18 DIAGNOSIS — E785 Hyperlipidemia, unspecified: Secondary | ICD-10-CM | POA: Diagnosis not present

## 2017-05-25 ENCOUNTER — Other Ambulatory Visit: Payer: Self-pay | Admitting: Cardiology

## 2017-06-01 ENCOUNTER — Ambulatory Visit (INDEPENDENT_AMBULATORY_CARE_PROVIDER_SITE_OTHER): Payer: PPO | Admitting: Orthopaedic Surgery

## 2017-06-01 ENCOUNTER — Encounter (INDEPENDENT_AMBULATORY_CARE_PROVIDER_SITE_OTHER): Payer: Self-pay

## 2017-06-02 ENCOUNTER — Ambulatory Visit: Payer: PPO | Admitting: Cardiology

## 2017-06-08 DIAGNOSIS — D485 Neoplasm of uncertain behavior of skin: Secondary | ICD-10-CM | POA: Diagnosis not present

## 2017-06-08 DIAGNOSIS — L821 Other seborrheic keratosis: Secondary | ICD-10-CM | POA: Diagnosis not present

## 2017-06-08 DIAGNOSIS — Z85828 Personal history of other malignant neoplasm of skin: Secondary | ICD-10-CM | POA: Diagnosis not present

## 2017-06-08 DIAGNOSIS — D1801 Hemangioma of skin and subcutaneous tissue: Secondary | ICD-10-CM | POA: Diagnosis not present

## 2017-06-08 DIAGNOSIS — L814 Other melanin hyperpigmentation: Secondary | ICD-10-CM | POA: Diagnosis not present

## 2017-06-08 DIAGNOSIS — L570 Actinic keratosis: Secondary | ICD-10-CM | POA: Diagnosis not present

## 2017-06-08 DIAGNOSIS — C44722 Squamous cell carcinoma of skin of right lower limb, including hip: Secondary | ICD-10-CM | POA: Diagnosis not present

## 2017-06-15 DIAGNOSIS — C44311 Basal cell carcinoma of skin of nose: Secondary | ICD-10-CM | POA: Diagnosis not present

## 2017-06-15 DIAGNOSIS — D485 Neoplasm of uncertain behavior of skin: Secondary | ICD-10-CM | POA: Diagnosis not present

## 2017-07-16 NOTE — Progress Notes (Signed)
Cardiology Office Note    Date:  07/20/2017   ID:  [name], [name] Oct 27, 1931, MRN [id number]  PCP:  Vickki Muff, MD  Cardiologist:  Peter Martinique, MD    History of Present Illness:  [name] [name] is a 83 y.o. male with a history of coronary disease and is status post redo coronary bypass surgery in 2007. In 2008 he was found to have occlusion of SVG to the intermediate branch and severe degeneration in SVG to RCA and distal LCx. The SVG was stented as was the native diagonal.  Cardiac catheterization in 2010 showed occlusion of the vein graft to the intermediate vessel and to the posterior descending artery. The stent in the diagonal was patent. The native LCx, intermediate, and RCA were occluded. He had a patent LIMA graft to the LAD and moderate left ventricular dysfunction. He has been treated medically.  He had an ETT which was done on 02/06/16. He walked 5 minutes with adequate HR and BP response. He had no chest pain. Ecg showed 3 mm ST depression in the inferolateral leads. This was felt to be consistent with his known disease and given lack of symptoms was not pursued further.   On follow up today he reports that over the past 5 years he has had sporadic symptoms of epigastric pain radiating under his ribs. Sometimes associated with diaphoresis. No relation to meals or activity. No change in bowels. Takes ASA with immediate relief. Has had prior evaluation with Chest and abdominal CT. Symptoms are clearly different from angina.  He has had a couple of recent colds an so hasn't been as active. He did do a lot of walking this past weekend and states he felt the best he has in 6 months. Legs do get tired with exertion. ABIs last year looked good.     Past Medical History:  Diagnosis Date  . CAD (coronary artery disease)    a. s/p  MI in 1988 and CABG 1989;  b. s/p redo CABG 2007;  c. s/p PCI DES to VG->PDA and native Diag;  d. 2010 Cath: occluded VG's to RI  and PDA, patent LIMA->LAD, patent stent in diag->med Rx.  . Carotid arterial disease ([place])    a. s/p bilat patch angioplasties;  b. 05/5620 RICA 3-08%, LICA 65-78%  . Chronic systolic CHF (congestive heart failure) ([place])    a. 03/2008 EF 30% noted on Myoview.  . CKD (chronic kidney disease), stage III ([place])   . Gout   . History of TIAs 2000  . HTN (hypertension)   . Hypercholesteremia   . Ischemic cardiomyopathy    a. 03/2008 EF 30% noted on Myoview.  . Prostate cancer ([place])   . Rheumatoid arthritis [facility])     Past Surgical History:  Procedure Laterality Date  . BACK SURGERY    . CARDIOVASCULAR STRESS TEST  04-02-08   EF 30%  . CAROTID ENDARTERECTOMY  1992, 2007   LEFT, RIGHT  . CHOLECYSTECTOMY N/A 06/22/2013   Procedure: LAPAROSCOPIC CHOLECYSTECTOMY;  Surgeon: Gayland Curry, MD;  Location: [facility] [place];  Service: General;  Laterality: N/A;  . CORONARY ARTERY BYPASS GRAFT  [phone]  . KNEE ARTHROSCOPY     RIGHT KNEE  . RADIOACTIVE SEED IMPLANT  2005    Current Medications: Outpatient Medications Prior to Visit  Medication Sig Dispense Refill  . allopurinol (ZYLOPRIM) 300 MG tablet TAKE 1 TABLET BY MOUTH EVERY DAY 90 tablet 1  . amLODipine (NORVASC) 10 MG  tablet Take 10 mg by mouth daily.      Marland Kitchen aspirin 81 MG tablet Take 81 mg by mouth daily.      Marland Kitchen atorvastatin (LIPITOR) 40 MG tablet Take 40 mg by mouth 2 (two) times daily.     . benazepril (LOTENSIN) 40 MG tablet Take 40 mg by mouth daily.      . clopidogrel (PLAVIX) 75 MG tablet Take 1 tablet (75 mg total) by mouth daily. NEED OV. 90 tablet 2  . ergocalciferol (VITAMIN D2) 50000 UNITS capsule Take 50,000 Units by mouth every 30 (thirty) days. Twice monthly 1st and 15th    . hydrALAZINE (APRESOLINE) 25 MG tablet TAKE 1 TABLET BY MOUTH THREE TIMES DAILY 270 tablet 0  . hydrochlorothiazide (MICROZIDE) 12.5 MG capsule Take 12.5 mg by mouth daily.    . isosorbide mononitrate (IMDUR) 30 MG 24 hr tablet Take 1 tablet (30 mg total) by mouth 2  (two) times daily. 180 tablet 3  . metFORMIN (GLUCOPHAGE) 500 MG tablet Take 1 tablet by mouth every evening.  4  . methotrexate (RHEUMATREX) 2.5 MG tablet Take 2.5 mg by mouth once a week. Caution:Chemotherapy. Protect from light. 10 tablets once a week. Take on Mondays    . metoprolol tartrate (LOPRESSOR) 25 MG tablet Take 12.5 mg by mouth 2 (two) times daily.      . nitroGLYCERIN (NITROSTAT) 0.4 MG SL tablet Place 1 tablet (0.4 mg total) under the tongue every 5 (five) minutes as needed. 25 tablet 6  . pantoprazole (PROTONIX) 40 MG tablet Take 1 tablet (40 mg total) by mouth daily. 30 tablet 6   No facility-administered medications prior to visit.      Allergies:   Patient has no known allergies.   Social History   Social History  . Marital status: Married    Spouse name: N/A  . Number of children: 3  . Years of education: N/A   Occupational History  . [employer] company safety     retired   Social History Main Topics  . Smoking status: Former Smoker    Types: Cigarettes    Quit date: 09/27/1986  . Smokeless tobacco: Never Used  . Alcohol use No  . Drug use: No  . Sexual activity: Not Asked   Other Topics Concern  . None   Social History Narrative   Lives in [place] with wife.  Works at a [employer] two days/wk and does water walking 3 days/wk x 30-45 mins per session.     Family History:  The patient's family history includes Cancer (age of onset: 47) in his mother; Other (age of onset: 73) in his father.   ROS:   Please see the history of present illness.    ROS All other systems reviewed and are negative.   PHYSICAL EXAM:   VS:  BP (!) 140/56   Pulse (!) 48   Ht 6\' 1"  (1.854 m)   Wt 177 lb (80.3 kg)   BMI 23.35 kg/m    GENERAL:  Well appearing WM in NAD HEENT:  PERRL, EOMI, sclera are clear. Oropharynx is clear. NECK:  No jugular venous distention, carotid upstroke brisk and symmetric with bilateral  bruits, no thyromegaly or adenopathy LUNGS:  Clear  to auscultation bilaterally CHEST:  Unremarkable HEART:  RRR,  PMI not displaced or sustained,S1 and S2 within normal limits, no S3, no S4: no clicks, no rubs, gr 8-8/5 systolic murmur. ABD:  Soft, nontender. BS +, no masses or bruits.  No hepatomegaly, no splenomegaly EXT:  2 + pulses throughout, no edema, no cyanosis no clubbing SKIN:  Warm and dry.  No rashes NEURO:  Alert and oriented x 3. Cranial nerves II through XII intact. PSYCH:  Cognitively intact    Wt Readings from Last 3 Encounters:  07/20/17 177 lb (80.3 kg)  03/16/17 174 lb (78.9 kg)  03/11/17 174 lb 6.4 oz (79.1 kg)      Studies/Labs Reviewed:   EKG:  EKG is not ordered today.  The ekg ordered today demonstrates N/A  Recent Labs: Labs dated 06/04/16: cholesterol 118, triglycerides 155, HDL 32, LDL 55. A1c 5.9%. BUN 26, creatinine 1.2. Other chemistries normal.  Dated 09/09/16: BUN 36, creatinine 1.69. Glucose 140, ALT 44, AST 51. T. Bili 2.0. Dated 05/11/17: cholesterol 110, triglycerides 119, HDL 29, LDL 57. A1c 5.7%. Hgb 12.1. Creatinine 1.5. Other chemistries and TSH normal.   Lipid Panel    Component Value Date/Time   CHOL 137 11/12/2010   TRIG 151 11/12/2010   HDL 33 (A) 11/12/2010   LDLCALC 74 11/12/2010    Additional studies/ records that were reviewed today include:  none   ASSESSMENT:   1. Coronary disease status post redo CABG in 2007. LIMA graft to the LAD is patent. Other grafts are occluded. Prior stent of the first diagonal. He denies any significant angina. His last ETT was predictably abnormal based on his known anatomy. I don't think stress testing is going to be helpful. I am not inclined to pursue further work up unless he is having more typical angina. He is on good medical therapy.  We'll continue with Plavix, isosorbide, amlodipine, or and metoprolol. He is also on a baby aspirin daily.  2. Hypertension, BP well controlled. Continue Rx.   3. Hypercholesterolemia continue atorvastatin.  Excellent control.   4. Chronic congestive heart failure with ischemic cardiomyopathy. Ejection fraction 40-45%. He is asymptomatic. No evidence of volume overload. Continue beta blocker and ACE inhibitor.  5. Carotid arterial disease status post carotid endarterectomy. Carotid dopplers in March 2018 without significant obstruction.  6. Atypical epigastric pain. Unclear etiology. Does not sound cardiac related. I reviewed CT and although he has a lot of aortic calcification there does not appear to be significant celiac or mesenteric obstruction. If symptoms persist would consider formal GI evaluation.   PLAN:     Medication Adjustments/Labs and Tests Ordered: Current medicines are reviewed at length with the patient today.  Concerns regarding medicines are outlined above.  Medication changes, Labs and Tests ordered today are listed in the Patient Instructions below. Patient Instructions  Continue your current therapy  I will see you in 6 months        Signed, Peter Martinique, MD  07/20/2017 3:52 PM    [facility] [address] [place], [place], [address] [phone]

## 2017-07-20 ENCOUNTER — Ambulatory Visit (INDEPENDENT_AMBULATORY_CARE_PROVIDER_SITE_OTHER): Payer: PPO | Admitting: Cardiology

## 2017-07-20 ENCOUNTER — Encounter: Payer: Self-pay | Admitting: Cardiology

## 2017-07-20 VITALS — BP 140/56 | HR 48 | Ht 73.0 in | Wt 177.0 lb

## 2017-07-20 DIAGNOSIS — E785 Hyperlipidemia, unspecified: Secondary | ICD-10-CM

## 2017-07-20 DIAGNOSIS — I10 Essential (primary) hypertension: Secondary | ICD-10-CM

## 2017-07-20 DIAGNOSIS — I5022 Chronic systolic (congestive) heart failure: Secondary | ICD-10-CM

## 2017-07-20 DIAGNOSIS — I25810 Atherosclerosis of coronary artery bypass graft(s) without angina pectoris: Secondary | ICD-10-CM | POA: Diagnosis not present

## 2017-07-20 NOTE — Patient Instructions (Addendum)
Continue your current therapy  I will see you in 6 months.   

## 2017-07-21 ENCOUNTER — Other Ambulatory Visit: Payer: Self-pay | Admitting: *Deleted

## 2017-07-21 DIAGNOSIS — I6523 Occlusion and stenosis of bilateral carotid arteries: Secondary | ICD-10-CM

## 2017-07-22 ENCOUNTER — Other Ambulatory Visit: Payer: Self-pay | Admitting: Cardiology

## 2017-07-22 NOTE — Telephone Encounter (Signed)
Should be filled by primary care  [name] Akter Martinique MD, [facility]

## 2017-07-27 NOTE — Telephone Encounter (Signed)
Again needs to go to primary care  Ivan Lacher Martinique MD, [facility]

## 2017-07-28 ENCOUNTER — Other Ambulatory Visit: Payer: Self-pay | Admitting: Cardiology

## 2017-08-26 ENCOUNTER — Other Ambulatory Visit: Payer: Self-pay | Admitting: Cardiology

## 2017-08-26 DIAGNOSIS — R748 Abnormal levels of other serum enzymes: Secondary | ICD-10-CM | POA: Diagnosis not present

## 2017-08-26 DIAGNOSIS — M779 Enthesopathy, unspecified: Secondary | ICD-10-CM | POA: Diagnosis not present

## 2017-08-26 DIAGNOSIS — M1990 Unspecified osteoarthritis, unspecified site: Secondary | ICD-10-CM | POA: Diagnosis not present

## 2017-08-26 DIAGNOSIS — Z79899 Other long term (current) drug therapy: Secondary | ICD-10-CM | POA: Diagnosis not present

## 2017-08-26 DIAGNOSIS — M0589 Other rheumatoid arthritis with rheumatoid factor of multiple sites: Secondary | ICD-10-CM | POA: Diagnosis not present

## 2017-08-26 DIAGNOSIS — M1A09X0 Idiopathic chronic gout, multiple sites, without tophus (tophi): Secondary | ICD-10-CM | POA: Diagnosis not present

## 2017-08-26 DIAGNOSIS — M25569 Pain in unspecified knee: Secondary | ICD-10-CM | POA: Diagnosis not present

## 2017-09-02 ENCOUNTER — Other Ambulatory Visit: Payer: Self-pay

## 2017-09-02 MED ORDER — ISOSORBIDE MONONITRATE ER 30 MG PO TB24: 30.0000 mg | ORAL_TABLET | Freq: Two times a day (BID) | ORAL | 3 refills | Status: DC

## 2017-09-22 ENCOUNTER — Other Ambulatory Visit: Payer: Self-pay

## 2017-09-22 MED ORDER — CLOPIDOGREL BISULFATE 75 MG PO TABS: 75.0000 mg | ORAL_TABLET | Freq: Every day | ORAL | 2 refills | Status: DC

## 2017-10-13 DIAGNOSIS — C44722 Squamous cell carcinoma of skin of right lower limb, including hip: Secondary | ICD-10-CM | POA: Diagnosis not present

## 2017-10-17 DIAGNOSIS — H2511 Age-related nuclear cataract, right eye: Secondary | ICD-10-CM | POA: Diagnosis not present

## 2017-10-17 DIAGNOSIS — H5203 Hypermetropia, bilateral: Secondary | ICD-10-CM | POA: Diagnosis not present

## 2017-10-17 DIAGNOSIS — Z961 Presence of intraocular lens: Secondary | ICD-10-CM | POA: Diagnosis not present

## 2017-10-17 DIAGNOSIS — H35372 Puckering of macula, left eye: Secondary | ICD-10-CM | POA: Diagnosis not present

## 2017-11-09 DIAGNOSIS — I2510 Atherosclerotic heart disease of native coronary artery without angina pectoris: Secondary | ICD-10-CM | POA: Diagnosis not present

## 2017-11-09 DIAGNOSIS — E1122 Type 2 diabetes mellitus with diabetic chronic kidney disease: Secondary | ICD-10-CM | POA: Diagnosis not present

## 2017-11-16 DIAGNOSIS — I2510 Atherosclerotic heart disease of native coronary artery without angina pectoris: Secondary | ICD-10-CM | POA: Diagnosis not present

## 2017-11-16 DIAGNOSIS — N183 Chronic kidney disease, stage 3 (moderate): Secondary | ICD-10-CM | POA: Diagnosis not present

## 2017-11-16 DIAGNOSIS — E785 Hyperlipidemia, unspecified: Secondary | ICD-10-CM | POA: Diagnosis not present

## 2017-11-16 DIAGNOSIS — I5022 Chronic systolic (congestive) heart failure: Secondary | ICD-10-CM | POA: Diagnosis not present

## 2017-11-16 DIAGNOSIS — E1122 Type 2 diabetes mellitus with diabetic chronic kidney disease: Secondary | ICD-10-CM | POA: Diagnosis not present

## 2017-11-16 DIAGNOSIS — I129 Hypertensive chronic kidney disease with stage 1 through stage 4 chronic kidney disease, or unspecified chronic kidney disease: Secondary | ICD-10-CM | POA: Diagnosis not present

## 2017-11-27 ENCOUNTER — Other Ambulatory Visit: Payer: Self-pay | Admitting: Cardiology

## 2017-11-28 DIAGNOSIS — M1990 Unspecified osteoarthritis, unspecified site: Secondary | ICD-10-CM | POA: Diagnosis not present

## 2017-11-28 DIAGNOSIS — R748 Abnormal levels of other serum enzymes: Secondary | ICD-10-CM | POA: Diagnosis not present

## 2017-11-28 DIAGNOSIS — M79642 Pain in left hand: Secondary | ICD-10-CM | POA: Diagnosis not present

## 2017-11-28 DIAGNOSIS — M79641 Pain in right hand: Secondary | ICD-10-CM | POA: Diagnosis not present

## 2017-11-28 DIAGNOSIS — M1A09X0 Idiopathic chronic gout, multiple sites, without tophus (tophi): Secondary | ICD-10-CM | POA: Diagnosis not present

## 2017-11-28 DIAGNOSIS — Z79899 Other long term (current) drug therapy: Secondary | ICD-10-CM | POA: Diagnosis not present

## 2017-11-28 DIAGNOSIS — M19042 Primary osteoarthritis, left hand: Secondary | ICD-10-CM | POA: Diagnosis not present

## 2017-11-28 DIAGNOSIS — M19041 Primary osteoarthritis, right hand: Secondary | ICD-10-CM | POA: Diagnosis not present

## 2017-11-28 DIAGNOSIS — M779 Enthesopathy, unspecified: Secondary | ICD-10-CM | POA: Diagnosis not present

## 2017-11-28 DIAGNOSIS — M25569 Pain in unspecified knee: Secondary | ICD-10-CM | POA: Diagnosis not present

## 2017-11-28 DIAGNOSIS — M0589 Other rheumatoid arthritis with rheumatoid factor of multiple sites: Secondary | ICD-10-CM | POA: Diagnosis not present

## 2017-11-28 NOTE — Telephone Encounter (Signed)
REFILL 

## 2017-11-30 ENCOUNTER — Other Ambulatory Visit: Payer: Self-pay | Admitting: *Deleted

## 2017-11-30 MED ORDER — HYDRALAZINE HCL 25 MG PO TABS: 25.0000 mg | ORAL_TABLET | Freq: Three times a day (TID) | ORAL | 1 refills | Status: DC

## 2017-12-05 ENCOUNTER — Ambulatory Visit (HOSPITAL_COMMUNITY)
Admission: RE | Admit: 2017-12-05 | Discharge: 2017-12-05 | Disposition: A | Discharge status: Home / Self Care | Payer: PPO | Source: Ambulatory Visit | Attending: Cardiology | Admitting: Cardiology

## 2017-12-05 DIAGNOSIS — I6523 Occlusion and stenosis of bilateral carotid arteries: Secondary | ICD-10-CM | POA: Insufficient documentation

## 2017-12-28 DIAGNOSIS — R21 Rash and other nonspecific skin eruption: Secondary | ICD-10-CM | POA: Diagnosis not present

## 2017-12-28 DIAGNOSIS — M1990 Unspecified osteoarthritis, unspecified site: Secondary | ICD-10-CM | POA: Diagnosis not present

## 2017-12-28 DIAGNOSIS — M0589 Other rheumatoid arthritis with rheumatoid factor of multiple sites: Secondary | ICD-10-CM | POA: Diagnosis not present

## 2017-12-28 DIAGNOSIS — R748 Abnormal levels of other serum enzymes: Secondary | ICD-10-CM | POA: Diagnosis not present

## 2017-12-28 DIAGNOSIS — Z79899 Other long term (current) drug therapy: Secondary | ICD-10-CM | POA: Diagnosis not present

## 2017-12-28 DIAGNOSIS — M25561 Pain in right knee: Secondary | ICD-10-CM | POA: Diagnosis not present

## 2017-12-28 DIAGNOSIS — M1A09X0 Idiopathic chronic gout, multiple sites, without tophus (tophi): Secondary | ICD-10-CM | POA: Diagnosis not present

## 2018-01-11 ENCOUNTER — Ambulatory Visit: Payer: PPO | Admitting: Cardiology

## 2018-01-11 NOTE — Progress Notes (Signed)
Cardiology Office Note    Date:  01/13/2018   ID:  [name] [name] 01/28/1932, MRN [id number]  PCP:  Vickki Muff, MD  Cardiologist:  Aleeza Bellville Martinique, MD    History of Present Illness:  [name] [name] is a 84 y.o. male with a history of coronary disease and is status post redo coronary bypass surgery in 2007. In 2008 he was found to have occlusion of SVG to the intermediate branch and severe degeneration in SVG to RCA and distal LCx. The SVG was stented as was the native diagonal.  Cardiac catheterization in 2010 showed occlusion of the vein graft to the intermediate vessel and to the posterior descending artery. The stent in the diagonal was patent. The native LCx, intermediate, and RCA were occluded. He had a patent LIMA graft to the LAD and moderate left ventricular dysfunction. He has been treated medically.  He had an ETT which was done on 02/06/16. He walked 5 minutes with adequate HR and BP response. He had no chest pain. Ecg showed 3 mm ST depression in the inferolateral leads. This was felt to be consistent with his known disease and given lack of symptoms was not pursued further.   On follow up today he is doing well from a cardiac standpoint. He denies any significant chest pain or SOB. 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 Notes his Rheumatologist noted Creatinine was increased. Over past 2 years has increased from 1.2 to 1.71. Nephrology consult recommended. Methotrexate stopped and allopurinol dose reduced.  His symptoms of epigastric pain are pretty rare now. Exercises 3x/week.     Past Medical History:  Diagnosis Date  . CAD (coronary artery disease)    a. s/p  MI in 1988 and CABG 1989;  b. s/p redo CABG 2007;  c. s/p PCI DES to VG->PDA and native Diag;  d. 2010 Cath: occluded VG's to RI and PDA, patent LIMA->LAD, patent stent in diag->med Rx.  . Carotid arterial disease ([place])    a. s/p bilat patch angioplasties;  b. 05/3234 RICA  5-73%, LICA 22-02%  . Chronic systolic CHF (congestive heart failure) ([place])    a. 03/2008 EF 30% noted on Myoview.  . CKD (chronic kidney disease), stage III ([place])   . Gout   . History of TIAs 2000  . HTN (hypertension)   . Hypercholesteremia   . Ischemic cardiomyopathy    a. 03/2008 EF 30% noted on Myoview.  . Prostate cancer ([place])   . Rheumatoid arthritis [facility])     Past Surgical History:  Procedure Laterality Date  . BACK SURGERY    . CARDIOVASCULAR STRESS TEST  04-02-08   EF 30%  . CAROTID ENDARTERECTOMY  1992, 2007   LEFT, RIGHT  . CHOLECYSTECTOMY N/A 06/22/2013   Procedure: LAPAROSCOPIC CHOLECYSTECTOMY;  Surgeon: Gayland Curry, MD;  Location: [place];  Service: General;  Laterality: N/A;  . CORONARY ARTERY BYPASS GRAFT  [phone]  . KNEE ARTHROSCOPY     RIGHT KNEE  . RADIOACTIVE SEED IMPLANT  2005    Current Medications: Outpatient Medications Prior to Visit  Medication Sig Dispense Refill  . allopurinol (ZYLOPRIM) 300 MG tablet TAKE 1 TABLET BY MOUTH EVERY DAY 90 tablet 1  . amLODipine (NORVASC) 10 MG tablet Take 10 mg by mouth daily.      Marland Kitchen aspirin 81 MG tablet Take 81 mg by mouth daily.      Marland Kitchen atorvastatin (LIPITOR) 40 MG tablet  Take 40 mg by mouth 2 (two) times daily.     . benazepril (LOTENSIN) 40 MG tablet Take 40 mg by mouth daily.      . clopidogrel (PLAVIX) 75 MG tablet Take 1 tablet (75 mg total) by mouth daily. NEED OV. 90 tablet 2  . ergocalciferol (VITAMIN D2) 50000 UNITS capsule Take 50,000 Units by mouth every 30 (thirty) days. Twice monthly 1st and 15th    . hydrALAZINE (APRESOLINE) 25 MG tablet Take 1 tablet (25 mg total) by mouth 3 (three) times daily. 270 tablet 1  . hydrochlorothiazide (MICROZIDE) 12.5 MG capsule Take 12.5 mg by mouth daily.    . isosorbide mononitrate (IMDUR) 30 MG 24 hr tablet Take 1 tablet (30 mg total) by mouth 2 (two) times daily. 180 tablet 3  . metFORMIN (GLUCOPHAGE) 500 MG tablet Take 1 tablet by mouth every evening.  4  .  metoprolol tartrate (LOPRESSOR) 25 MG tablet Take 12.5 mg by mouth 2 (two) times daily.      . nitroGLYCERIN (NITROSTAT) 0.4 MG SL tablet Place 1 tablet (0.4 mg total) under the tongue every 5 (five) minutes as needed. 25 tablet 6  . allopurinol (ZYLOPRIM) 300 MG tablet TAKE 1 TABLET BY MOUTH EVERY DAY 30 tablet 0  . methotrexate (RHEUMATREX) 2.5 MG tablet Take 2.5 mg by mouth once a week. Caution:Chemotherapy. Protect from light. 10 tablets once a week. Take on Mondays     No facility-administered medications prior to visit.      Allergies:   Patient has no known allergies.   Social History   Socioeconomic History  . Marital status: Married    Spouse name: Not on file  . Number of children: 3  . Years of education: Not on file  . Highest education level: Not on file  Occupational History  . 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 Financial resource strain: Not on file  . Food insecurity:    Worry: Not on file    Inability: Not on file  . Transportation needs:    Medical: Not on file    Non-medical: Not on file  Tobacco Use  . Smoking status: Former Smoker    Types: Cigarettes    Last attempt to quit: 09/27/1986    Years since quitting: 31.3  . Smokeless tobacco: Never Used  Substance and Sexual Activity  . Alcohol use: No  . Drug use: No  . Sexual activity: Not on file  Lifestyle  . Physical activity:    Days per week: Not on file    Minutes per session: Not on file  . Stress: Not on file  Relationships  . Social connections:    Talks on phone: Not on file    Gets together: Not on file    Attends religious service: Not on file    Active member of club or organization: Not on file    Attends meetings of clubs or organizations: Not on file    Relationship status: Not on file  Other Topics Concern  . Not on file  Social History Narrative   Lives in [place] with wife.  Works at a [employer] two days/wk and does water walking 3  days/wk x 30-45 mins per session.     Family History:  The patient's family history includes Cancer (age of onset: 35) in his mother; Other (age of onset: 22) in his father.   ROS:   Please see the history  of present illness.    ROS All other systems reviewed and are negative.   PHYSICAL EXAM:   VS:  BP 140/70   Pulse (!) 50   Ht 6\' 1"  (1.854 m)   Wt 177 lb 12.8 oz (80.6 kg)   SpO2 94%   BMI 23.46 kg/m    GENERAL:  Well appearing HEENT:  PERRL, EOMI, sclera are clear. Oropharynx is clear. NECK:  No jugular venous distention, carotid upstroke brisk and symmetric, bilateral bruits, no thyromegaly or adenopathy LUNGS:  Clear to auscultation bilaterally CHEST:  Unremarkable HEART:  RRR,  PMI not displaced or sustained,S1 and S2 within normal limits, no S3, no S4: no clicks, no rubs, 5-5/7 systolic murmur ABD:  Soft, nontender. BS +, no masses or bruits. No hepatomegaly, no splenomegaly EXT:  2 + pulses throughout, no edema, no cyanosis no clubbing SKIN:  Warm and dry.  No rashes NEURO:  Alert and oriented x 3. Cranial nerves II through XII intact. PSYCH:  Cognitively intact      Wt Readings from Last 3 Encounters:  01/13/18 177 lb 12.8 oz (80.6 kg)  07/20/17 177 lb (80.3 kg)  03/16/17 174 lb (78.9 kg)      Studies/Labs Reviewed:   EKG:  EKG is not ordered today.  The ekg ordered today demonstrates N/A  Recent Labs: Labs dated 06/04/16: cholesterol 118, triglycerides 155, HDL 32, LDL 55. A1c 5.9%. BUN 26, creatinine 1.2. Other chemistries normal.  Dated 09/09/16: BUN 36, creatinine 1.69. Glucose 140, ALT 44, AST 51. T. Bili 2.0. Dated 05/11/17: cholesterol 110, triglycerides 119, HDL 29, LDL 57. A1c 5.7%. Hgb 12.1. Creatinine 1.5. Other chemistries and TSH normal.  Dated 11/09/17: cholesterol 122, triglycerides 197, HDL 31, LDL 52. A1c 5.9%.  Dated 12/28/17: CBC normal. Creatinine 1.71. Other chemistries normal.   Lipid Panel    Component Value Date/Time   CHOL 137  11/12/2010   TRIG 151 11/12/2010   HDL 33 (A) 11/12/2010   LDLCALC 74 11/12/2010    Additional studies/ records that were reviewed today include:  Carotid dopples 12/05/17: < 39% bilateral carotid disease.   ASSESSMENT:   1. Coronary disease status post redo CABG in 2007. LIMA graft to the LAD is patent. Other grafts are occluded. Prior stent of the first diagonal. He denies any significant angina. His last ETT was predictably abnormal based on his known anatomy. I don't think stress testing is going to be helpful. He has class 1 angina so I would not pursue further evaluation.  He is on good medical therapy.  We'll continue with Plavix, isosorbide, amlodipine, or and metoprolol. He is also on a baby aspirin daily.  2. Hypertension, BP well controlled. Continue Rx.   3. Hypercholesterolemia continue atorvastatin. Excellent control.   4. Chronic congestive heart failure with ischemic cardiomyopathy. Ejection fraction 40-45%. He is asymptomatic. No evidence of volume overload. Continue beta blocker and ACE inhibitor.  5. Carotid arterial disease status post carotid endarterectomy. Carotid dopplers in March 2019 without significant obstruction.  6. CKD now stage 3. Agree with nephrology evaluation.   PLAN:     Medication Adjustments/Labs and Tests Ordered: Current medicines are reviewed at length with the patient today.  Concerns regarding medicines are outlined above.  Medication changes, Labs and Tests ordered today are listed in the Patient Instructions below. Patient Instructions  Continue your current therapy  I will see you in 6 months.    Signed, Izabela Ow Martinique, MD  01/13/2018 8:00 AM    [facility] [address] [place], [place], [address] [phone]

## 2018-01-13 ENCOUNTER — Encounter: Payer: Self-pay | Admitting: Cardiology

## 2018-01-13 ENCOUNTER — Ambulatory Visit: Payer: PPO | Admitting: Cardiology

## 2018-01-13 VITALS — BP 140/70 | HR 50 | Ht 73.0 in | Wt 177.8 lb

## 2018-01-13 DIAGNOSIS — I5022 Chronic systolic (congestive) heart failure: Secondary | ICD-10-CM

## 2018-01-13 DIAGNOSIS — I25708 Atherosclerosis of coronary artery bypass graft(s), unspecified, with other forms of angina pectoris: Secondary | ICD-10-CM | POA: Diagnosis not present

## 2018-01-13 DIAGNOSIS — E7800 Pure hypercholesterolemia, unspecified: Secondary | ICD-10-CM | POA: Diagnosis not present

## 2018-01-13 NOTE — Patient Instructions (Signed)
Continue your current therapy  I will see you in 6 months.   

## 2018-02-10 DIAGNOSIS — Z8546 Personal history of malignant neoplasm of prostate: Secondary | ICD-10-CM | POA: Diagnosis not present

## 2018-02-22 DIAGNOSIS — Z8546 Personal history of malignant neoplasm of prostate: Secondary | ICD-10-CM | POA: Diagnosis not present

## 2018-02-22 DIAGNOSIS — N393 Stress incontinence (female) (male): Secondary | ICD-10-CM | POA: Diagnosis not present

## 2018-02-22 DIAGNOSIS — N5201 Erectile dysfunction due to arterial insufficiency: Secondary | ICD-10-CM | POA: Diagnosis not present

## 2018-03-29 DIAGNOSIS — M0589 Other rheumatoid arthritis with rheumatoid factor of multiple sites: Secondary | ICD-10-CM | POA: Diagnosis not present

## 2018-03-29 DIAGNOSIS — N289 Disorder of kidney and ureter, unspecified: Secondary | ICD-10-CM | POA: Diagnosis not present

## 2018-03-29 DIAGNOSIS — M1A09X0 Idiopathic chronic gout, multiple sites, without tophus (tophi): Secondary | ICD-10-CM | POA: Diagnosis not present

## 2018-03-29 DIAGNOSIS — Z79899 Other long term (current) drug therapy: Secondary | ICD-10-CM | POA: Diagnosis not present

## 2018-03-29 DIAGNOSIS — M1990 Unspecified osteoarthritis, unspecified site: Secondary | ICD-10-CM | POA: Diagnosis not present

## 2018-04-16 DIAGNOSIS — J069 Acute upper respiratory infection, unspecified: Secondary | ICD-10-CM | POA: Diagnosis not present

## 2018-04-26 DIAGNOSIS — M0589 Other rheumatoid arthritis with rheumatoid factor of multiple sites: Secondary | ICD-10-CM | POA: Diagnosis not present

## 2018-04-26 DIAGNOSIS — M1990 Unspecified osteoarthritis, unspecified site: Secondary | ICD-10-CM | POA: Diagnosis not present

## 2018-04-26 DIAGNOSIS — M1A09X0 Idiopathic chronic gout, multiple sites, without tophus (tophi): Secondary | ICD-10-CM | POA: Diagnosis not present

## 2018-04-26 DIAGNOSIS — N289 Disorder of kidney and ureter, unspecified: Secondary | ICD-10-CM | POA: Diagnosis not present

## 2018-04-26 DIAGNOSIS — Z79899 Other long term (current) drug therapy: Secondary | ICD-10-CM | POA: Diagnosis not present

## 2018-05-03 ENCOUNTER — Ambulatory Visit (INDEPENDENT_AMBULATORY_CARE_PROVIDER_SITE_OTHER): Payer: PPO | Admitting: Podiatry

## 2018-05-03 ENCOUNTER — Other Ambulatory Visit: Payer: Self-pay

## 2018-05-03 ENCOUNTER — Encounter: Payer: Self-pay | Admitting: Podiatry

## 2018-05-03 ENCOUNTER — Other Ambulatory Visit: Payer: Self-pay | Admitting: Podiatry

## 2018-05-03 ENCOUNTER — Ambulatory Visit (INDEPENDENT_AMBULATORY_CARE_PROVIDER_SITE_OTHER): Payer: PPO

## 2018-05-03 VITALS — BP 157/79 | HR 62

## 2018-05-03 DIAGNOSIS — M79675 Pain in left toe(s): Secondary | ICD-10-CM | POA: Diagnosis not present

## 2018-05-03 DIAGNOSIS — L84 Corns and callosities: Secondary | ICD-10-CM

## 2018-05-03 DIAGNOSIS — M779 Enthesopathy, unspecified: Secondary | ICD-10-CM

## 2018-05-03 DIAGNOSIS — M2042 Other hammer toe(s) (acquired), left foot: Secondary | ICD-10-CM | POA: Diagnosis not present

## 2018-05-03 MED ORDER — TRIAMCINOLONE ACETONIDE 10 MG/ML IJ SUSP
10.0000 mg | Freq: Once | INTRAMUSCULAR | Status: AC
  Administered 2018-05-03: 10 mg

## 2018-05-04 NOTE — Progress Notes (Signed)
Subjective:   Patient ID: [name] [name], male   DOB: 84 y.o.   MRN: [id number]   HPI Patient presents with pain between the fourth and left fifth digits is been present around 4 months and at times it sharp and he is tried to trim it but it does not seem to be getting better and it feels like fluid is in it.  Patient does not smoke likes to be active   Review of Systems  All other systems reviewed and are negative.       Objective:  Physical Exam  Constitutional: He appears well-developed and well-nourished.  Cardiovascular: Intact distal pulses.  Pulmonary/Chest: Effort normal.  Musculoskeletal: Normal range of motion.  Neurological: He is alert.  Skin: Skin is warm.  Nursing note and vitals reviewed.   Neurovascular status was found to be intact with muscle strength adequate range of motion within normal limits.  Patient has a keratotic lesion fourth digit left with fluid buildup of the inner phalangeal joint and pain with palpation with irritation of the fifth digit left of the localized nature.  Patient is found to have good digital perfusion is well oriented x3     Assessment:  Inflammatory capsulitis fourth digit left of the inner phalangeal joint with pain and hammertoe deformity     Plan:  H&P condition reviewed and at this time I did a proximal nerve block of the fourth toe.  I then did sterile prep of the area and injected the interphalangeal joint 2 mg Dexasone Kenalog 2 mg Xylocaine I then using sterile instrumentation debrided the lesion applied padding to take pressure off of this and reappoint to recheck  X-ray indicates that there is slight enlargement with rotation of the fifth digit against the fourth toe left

## 2018-05-24 DIAGNOSIS — N390 Urinary tract infection, site not specified: Secondary | ICD-10-CM | POA: Diagnosis not present

## 2018-05-24 DIAGNOSIS — I5022 Chronic systolic (congestive) heart failure: Secondary | ICD-10-CM | POA: Diagnosis not present

## 2018-05-24 DIAGNOSIS — Z0000 Encounter for general adult medical examination without abnormal findings: Secondary | ICD-10-CM | POA: Diagnosis not present

## 2018-05-24 DIAGNOSIS — I2510 Atherosclerotic heart disease of native coronary artery without angina pectoris: Secondary | ICD-10-CM | POA: Diagnosis not present

## 2018-05-24 DIAGNOSIS — E1122 Type 2 diabetes mellitus with diabetic chronic kidney disease: Secondary | ICD-10-CM | POA: Diagnosis not present

## 2018-05-24 DIAGNOSIS — E785 Hyperlipidemia, unspecified: Secondary | ICD-10-CM | POA: Diagnosis not present

## 2018-05-26 DIAGNOSIS — C44622 Squamous cell carcinoma of skin of right upper limb, including shoulder: Secondary | ICD-10-CM | POA: Diagnosis not present

## 2018-05-26 DIAGNOSIS — C4441 Basal cell carcinoma of skin of scalp and neck: Secondary | ICD-10-CM | POA: Diagnosis not present

## 2018-05-26 DIAGNOSIS — Z85828 Personal history of other malignant neoplasm of skin: Secondary | ICD-10-CM | POA: Diagnosis not present

## 2018-05-26 DIAGNOSIS — C4442 Squamous cell carcinoma of skin of scalp and neck: Secondary | ICD-10-CM | POA: Diagnosis not present

## 2018-05-26 DIAGNOSIS — L570 Actinic keratosis: Secondary | ICD-10-CM | POA: Diagnosis not present

## 2018-05-26 DIAGNOSIS — D1801 Hemangioma of skin and subcutaneous tissue: Secondary | ICD-10-CM | POA: Diagnosis not present

## 2018-05-26 DIAGNOSIS — D485 Neoplasm of uncertain behavior of skin: Secondary | ICD-10-CM | POA: Diagnosis not present

## 2018-05-31 DIAGNOSIS — I2510 Atherosclerotic heart disease of native coronary artery without angina pectoris: Secondary | ICD-10-CM | POA: Diagnosis not present

## 2018-05-31 DIAGNOSIS — Z23 Encounter for immunization: Secondary | ICD-10-CM | POA: Diagnosis not present

## 2018-05-31 DIAGNOSIS — E785 Hyperlipidemia, unspecified: Secondary | ICD-10-CM | POA: Diagnosis not present

## 2018-05-31 DIAGNOSIS — E1122 Type 2 diabetes mellitus with diabetic chronic kidney disease: Secondary | ICD-10-CM | POA: Diagnosis not present

## 2018-05-31 DIAGNOSIS — I5022 Chronic systolic (congestive) heart failure: Secondary | ICD-10-CM | POA: Diagnosis not present

## 2018-05-31 DIAGNOSIS — I129 Hypertensive chronic kidney disease with stage 1 through stage 4 chronic kidney disease, or unspecified chronic kidney disease: Secondary | ICD-10-CM | POA: Diagnosis not present

## 2018-05-31 DIAGNOSIS — N183 Chronic kidney disease, stage 3 (moderate): Secondary | ICD-10-CM | POA: Diagnosis not present

## 2018-05-31 DIAGNOSIS — M1A09X0 Idiopathic chronic gout, multiple sites, without tophus (tophi): Secondary | ICD-10-CM | POA: Diagnosis not present

## 2018-05-31 DIAGNOSIS — M0589 Other rheumatoid arthritis with rheumatoid factor of multiple sites: Secondary | ICD-10-CM | POA: Diagnosis not present

## 2018-05-31 DIAGNOSIS — Z0000 Encounter for general adult medical examination without abnormal findings: Secondary | ICD-10-CM | POA: Diagnosis not present

## 2018-06-21 DIAGNOSIS — M1990 Unspecified osteoarthritis, unspecified site: Secondary | ICD-10-CM | POA: Diagnosis not present

## 2018-06-21 DIAGNOSIS — Z79899 Other long term (current) drug therapy: Secondary | ICD-10-CM | POA: Diagnosis not present

## 2018-06-21 DIAGNOSIS — M1A09X0 Idiopathic chronic gout, multiple sites, without tophus (tophi): Secondary | ICD-10-CM | POA: Diagnosis not present

## 2018-06-21 DIAGNOSIS — M25512 Pain in left shoulder: Secondary | ICD-10-CM | POA: Diagnosis not present

## 2018-06-21 DIAGNOSIS — N289 Disorder of kidney and ureter, unspecified: Secondary | ICD-10-CM | POA: Diagnosis not present

## 2018-06-21 DIAGNOSIS — M0589 Other rheumatoid arthritis with rheumatoid factor of multiple sites: Secondary | ICD-10-CM | POA: Diagnosis not present

## 2018-06-22 ENCOUNTER — Other Ambulatory Visit: Payer: Self-pay | Admitting: Cardiology

## 2018-06-22 NOTE — Telephone Encounter (Signed)
Rx request sent to pharmacy.  

## 2018-06-23 ENCOUNTER — Other Ambulatory Visit: Payer: Self-pay | Admitting: Cardiology

## 2018-06-23 NOTE — Telephone Encounter (Signed)
Rx request sent to pharmacy.  

## 2018-06-26 NOTE — Progress Notes (Signed)
Cardiology Office Note    Date:  06/28/2018   ID:  [name] [name], [name] [name] [birth year], MRN [id number]  PCP:  Vickki Muff, MD  Cardiologist:  Latoshia Monrroy Martinique, MD    History of Present Illness:  [name] [name] is a 84 y.o. male with a history of coronary disease and is status post redo coronary bypass surgery in 2007. In 2008 he was found to have occlusion of SVG to the intermediate branch and severe degeneration in SVG to RCA and distal LCx. The SVG was stented as was the native diagonal.  Cardiac catheterization in 2010 showed occlusion of the vein graft to the intermediate vessel and to the posterior descending artery. The stent in the diagonal was patent. The native LCx, intermediate, and RCA were occluded. He had a patent LIMA graft to the LAD and moderate left ventricular dysfunction. He has been treated medically.  He had an ETT which was done on 02/06/16. He walked 5 minutes with adequate HR and BP response. He had no chest pain. Ecg showed 3 mm ST depression in the inferolateral leads. This was felt to be consistent with his known disease and given lack of symptoms was not pursued further.   On follow up today he is doing well from a cardiac standpoint. He has no chest pain, epigastric pain,  or SOB. No palpitations or edema. Rarely gets lightheaded.  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 He is going to move to a townhome in November.    Past Medical History:  Diagnosis Date  . CAD (coronary artery disease)    a. s/p  MI in 1988 and CABG 1989;  b. s/p redo CABG 2007;  c. s/p PCI DES to VG->PDA and native Diag;  d. 2010 Cath: occluded VG's to RI and PDA, patent LIMA->LAD, patent stent in diag->med Rx.  . Carotid arterial disease ([place])    a. s/p bilat patch angioplasties;  b. 09/3084 RICA 5-78%, LICA 46-96%  . Chronic systolic CHF (congestive heart failure) ([place])    a. 03/2008 EF 30% noted on Myoview.  . CKD (chronic kidney disease), stage III ([place])   .  Gout   . History of TIAs 2000  . HTN (hypertension)   . Hypercholesteremia   . Ischemic cardiomyopathy    a. 03/2008 EF 30% noted on Myoview.  . Prostate cancer ([place])   . Rheumatoid arthritis [facility])     Past Surgical History:  Procedure Laterality Date  . BACK SURGERY    . CARDIOVASCULAR STRESS TEST  04-02-08   EF 30%  . CAROTID ENDARTERECTOMY  1992, 2007   LEFT, RIGHT  . CHOLECYSTECTOMY N/A 06/22/2013   Procedure: LAPAROSCOPIC CHOLECYSTECTOMY;  Surgeon: Gayland Curry, MD;  Location: [facility];  Service: General;  Laterality: N/A;  . CORONARY ARTERY BYPASS GRAFT  [phone]  . KNEE ARTHROSCOPY     RIGHT KNEE  . RADIOACTIVE SEED IMPLANT  2005    Current Medications: Outpatient Medications Prior to Visit  Medication Sig Dispense Refill  . allopurinol (ZYLOPRIM) 300 MG tablet TAKE 1 TABLET BY MOUTH EVERY DAY 90 tablet 1  . amLODipine (NORVASC) 10 MG tablet Take 10 mg by mouth daily.      Marland Kitchen aspirin 81 MG tablet Take 81 mg by mouth daily.      Marland Kitchen atorvastatin (LIPITOR) 40 MG tablet Take 40 mg by mouth 2 (two) times daily.     . benazepril (LOTENSIN) 40 MG tablet  Take 40 mg by mouth daily.      . clopidogrel (PLAVIX) 75 MG tablet TAKE 1 TABLET BY MOUTH EVERY DAY 90 tablet 1  . ergocalciferol (VITAMIN D2) 50000 UNITS capsule Take 50,000 Units by mouth every 30 (thirty) days. Twice monthly 1st and 15th    . hydrALAZINE (APRESOLINE) 25 MG tablet TAKE 1 TABLET BY MOUTH THREE TIMES A DAY 270 tablet 1  . hydrochlorothiazide (MICROZIDE) 12.5 MG capsule Take 12.5 mg by mouth daily.    . isosorbide mononitrate (IMDUR) 30 MG 24 hr tablet Take 1 tablet (30 mg total) by mouth 2 (two) times daily. 180 tablet 3  . ketoconazole (NIZORAL) 2 % shampoo See admin instructions.  6  . leflunomide (ARAVA) 10 MG tablet Take 10 mg by mouth daily.  3  . metFORMIN (GLUCOPHAGE-XR) 500 MG 24 hr tablet TAKE 1 TABLET BY MOUTH EVERY DAY WITH EVENING MEAL  1  . metoprolol tartrate (LOPRESSOR) 25 MG tablet Take 12.5 mg by  mouth 2 (two) times daily.      . nitroGLYCERIN (NITROSTAT) 0.4 MG SL tablet Place 1 tablet (0.4 mg total) under the tongue every 5 (five) minutes as needed. 25 tablet 6  . predniSONE (DELTASONE) 5 MG tablet Take 7.5 mg by mouth daily.   3  . azelastine (ASTELIN) 0.1 % nasal spray SPRAY 1 SPRAY INTO EACH NOSTRIL TWICE A DAY  0  . Dextromethorphan-guaiFENesin (MUCINEX DM MAXIMUM STRENGTH) 60-1200 MG TB12 TAKE 1 TABLET BY MOUTH TWICE A DAY  0   No facility-administered medications prior to visit.      Allergies:   Patient has no known allergies.   Social History   Socioeconomic History  . Marital status: Married    Spouse name: Not on file  . Number of children: 3  . Years of education: Not on file  . Highest education level: Not on file  Occupational History  . 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 Financial resource strain: Not on file  . Food insecurity:    Worry: Not on file    Inability: Not on file  . Transportation needs:    Medical: Not on file    Non-medical: Not on file  Tobacco Use  . Smoking status: Former Smoker    Types: Cigarettes    Last attempt to quit: 09/27/1986    Years since quitting: 31.7  . Smokeless tobacco: Never Used  Substance and Sexual Activity  . Alcohol use: No  . Drug use: No  . Sexual activity: Not on file  Lifestyle  . Physical activity:    Days per week: Not on file    Minutes per session: Not on file  . Stress: Not on file  Relationships  . Social connections:    Talks on phone: Not on file    Gets together: Not on file    Attends religious service: Not on file    Active member of club or organization: Not on file    Attends meetings of clubs or organizations: Not on file    Relationship status: Not on file  Other Topics Concern  . Not on file  Social History Narrative   Lives in [place] with wife.  Works at a [employer] two days/wk and does water walking 3 days/wk x 30-45 mins per  session.     Family History:  The patient's family history includes Cancer (age of onset: 68) in his mother; Other (age  of onset: 67) in his father.   ROS:   Please see the history of present illness.    ROS All other systems reviewed and are negative.   PHYSICAL EXAM:   VS:  BP (!) 152/58   Pulse (!) 57   Ht 6\' 1"  (1.854 m)   Wt 177 lb (80.3 kg)   BMI 23.35 kg/m    GENERAL:  Well appearing WM in NAD HEENT:  PERRL, EOMI, sclera are clear. Oropharynx is clear. NECK:  No jugular venous distention, carotid upstroke brisk and symmetric, bilateral  bruits, no thyromegaly or adenopathy LUNGS:  Clear to auscultation bilaterally CHEST:  Unremarkable HEART:  RRR,  PMI not displaced or sustained,S1 and S2 within normal limits, no S3, no S4: no clicks, no rubs, gr 2/6 SEM ABD:  Soft, nontender. BS +, no masses or bruits. No hepatomegaly, no splenomegaly EXT:  2 + pulses throughout, no edema, no cyanosis no clubbing SKIN:  Warm and dry.  No rashes NEURO:  Alert and oriented x 3. Cranial nerves II through XII intact. PSYCH:  Cognitively intact        Wt Readings from Last 3 Encounters:  06/28/18 177 lb (80.3 kg)  01/13/18 177 lb 12.8 oz (80.6 kg)  07/20/17 177 lb (80.3 kg)      Studies/Labs Reviewed:   EKG:  EKG is  ordered today.  The ekg ordered today demonstrates NSR with PACs and PVCs. LVH with repolarization abnormality and QRS widening. I have personally reviewed and interpreted this study.   Recent Labs: Labs dated 06/04/16: cholesterol 118, triglycerides 155, HDL 32, LDL 55. A1c 5.9%. BUN 26, creatinine 1.2. Other chemistries normal.  Dated 09/09/16: BUN 36, creatinine 1.69. Glucose 140, ALT 44, AST 51. T. Bili 2.0. Dated 05/11/17: cholesterol 110, triglycerides 119, HDL 29, LDL 57. A1c 5.7%. Hgb 12.1. Creatinine 1.5. Other chemistries and TSH normal.  Dated 11/09/17: cholesterol 122, triglycerides 197, HDL 31, LDL 52. A1c 5.9%.  Dated 12/28/17: CBC normal. Creatinine 1.71.  Other chemistries normal.  Dated 04/26/18: creatinine 1.46, Hgb, TSH, ALT normal.  Lipid Panel    Component Value Date/Time   CHOL 137 11/12/2010   TRIG 151 11/12/2010   HDL 33 (A) 11/12/2010   [place] 74 11/12/2010    Additional studies/ records that were reviewed today include:  Carotid dopples 12/05/17: < 39% bilateral carotid disease.   ASSESSMENT:   1. Coronary disease status post redo CABG in 2007. LIMA graft to the LAD is patent. Other grafts are occluded. Prior stent of the first diagonal. He is asymptomatic. His last ETT is predictably abnormal based on his known anatomy. I don't think stress testing is going to be helpful. He has class 1 angina so I would not pursue further evaluation.   We'll continue with Plavix, isosorbide, amlodipine, or and metoprolol. He is also on a baby aspirin daily.  2. Hypertension, BP well controlled. Continue Rx.   3. Hypercholesterolemia continue atorvastatin. Excellent control.   4. Chronic congestive heart failure with ischemic cardiomyopathy. Ejection fraction 40-45%. He is asymptomatic. No evidence of volume overload. Continue beta blocker and ACE inhibitor.  5. Carotid arterial disease status post carotid endarterectomy. Carotid dopplers in March 2019 without significant obstruction.  6. CKD now stage 3. Stable    PLAN:     Medication Adjustments/Labs and Tests Ordered: Current medicines are reviewed at length with the patient today.  Concerns regarding medicines are outlined above.  Medication changes, Labs and Tests ordered today are listed in  the Patient Instructions below. Patient Instructions  Continue your current therapy  Good luck with your move      Signed, Shenita Trego Martinique, MD  06/28/2018 10:02 AM    [facility] [address] [place], [place], [address] [phone]

## 2018-06-28 ENCOUNTER — Encounter: Payer: Self-pay | Admitting: Cardiology

## 2018-06-28 ENCOUNTER — Ambulatory Visit: Payer: PPO | Admitting: Cardiology

## 2018-06-28 VITALS — BP 152/58 | HR 57 | Ht 73.0 in | Wt 177.0 lb

## 2018-06-28 DIAGNOSIS — I6523 Occlusion and stenosis of bilateral carotid arteries: Secondary | ICD-10-CM

## 2018-06-28 DIAGNOSIS — E785 Hyperlipidemia, unspecified: Secondary | ICD-10-CM | POA: Diagnosis not present

## 2018-06-28 DIAGNOSIS — E7800 Pure hypercholesterolemia, unspecified: Secondary | ICD-10-CM

## 2018-06-28 DIAGNOSIS — I5022 Chronic systolic (congestive) heart failure: Secondary | ICD-10-CM | POA: Diagnosis not present

## 2018-06-28 DIAGNOSIS — I25708 Atherosclerosis of coronary artery bypass graft(s), unspecified, with other forms of angina pectoris: Secondary | ICD-10-CM | POA: Diagnosis not present

## 2018-06-28 NOTE — Patient Instructions (Signed)
Continue your current therapy  Good luck with your move

## 2018-08-02 DIAGNOSIS — C4442 Squamous cell carcinoma of skin of scalp and neck: Secondary | ICD-10-CM | POA: Diagnosis not present

## 2018-08-02 DIAGNOSIS — L905 Scar conditions and fibrosis of skin: Secondary | ICD-10-CM | POA: Diagnosis not present

## 2018-08-02 DIAGNOSIS — C4441 Basal cell carcinoma of skin of scalp and neck: Secondary | ICD-10-CM | POA: Diagnosis not present

## 2018-08-13 ENCOUNTER — Other Ambulatory Visit: Payer: Self-pay | Admitting: Cardiology

## 2018-08-23 DIAGNOSIS — M0589 Other rheumatoid arthritis with rheumatoid factor of multiple sites: Secondary | ICD-10-CM | POA: Diagnosis not present

## 2018-08-25 DIAGNOSIS — J22 Unspecified acute lower respiratory infection: Secondary | ICD-10-CM | POA: Diagnosis not present

## 2018-09-14 DIAGNOSIS — R0609 Other forms of dyspnea: Secondary | ICD-10-CM | POA: Diagnosis not present

## 2018-09-14 DIAGNOSIS — R05 Cough: Secondary | ICD-10-CM | POA: Diagnosis not present

## 2018-09-14 DIAGNOSIS — R6889 Other general symptoms and signs: Secondary | ICD-10-CM | POA: Diagnosis not present

## 2018-09-14 DIAGNOSIS — Z8701 Personal history of pneumonia (recurrent): Secondary | ICD-10-CM | POA: Diagnosis not present

## 2018-09-14 DIAGNOSIS — Z8679 Personal history of other diseases of the circulatory system: Secondary | ICD-10-CM | POA: Diagnosis not present

## 2018-09-14 DIAGNOSIS — R7989 Other specified abnormal findings of blood chemistry: Secondary | ICD-10-CM | POA: Diagnosis not present

## 2018-09-15 DIAGNOSIS — L905 Scar conditions and fibrosis of skin: Secondary | ICD-10-CM | POA: Diagnosis not present

## 2018-09-15 DIAGNOSIS — C44622 Squamous cell carcinoma of skin of right upper limb, including shoulder: Secondary | ICD-10-CM | POA: Diagnosis not present

## 2018-10-08 NOTE — Progress Notes (Signed)
Cardiology Office Note    Date:  10/11/2018   ID:  [name], [name] May 13, 1932, MRN [id number]  PCP:  Vickki Muff, MD  Cardiologist:  Phi Avans Martinique, MD    History of Present Illness:  [name] [name] is a 85 y.o. male with a history of coronary disease and is status post redo coronary bypass surgery in 2007. In 2008 he was found to have occlusion of SVG to the intermediate branch and severe degeneration in SVG to RCA and distal LCx. The SVG was stented as was the native diagonal.  Cardiac catheterization in 2010 showed occlusion of the vein graft to the intermediate vessel and to the posterior descending artery. The stent in the diagonal was patent. The native LCx, intermediate, and RCA were occluded. He had a patent LIMA graft to the LAD and moderate left ventricular dysfunction. He has been treated medically.  He had an ETT which was done on 02/06/16. He walked 5 minutes with adequate HR and BP response. He had no chest pain. Ecg showed 3 mm ST depression in the inferolateral leads. This was felt to be consistent with his known disease and given lack of symptoms was not pursued further.   On follow up today he is doing well from a cardiac standpoint. He did develop a bad upper respiratory infection in November. Seen at Urgent care. CXR done- did not show PNA. Symptoms persisted and seen by primary care. Again CXR clear. Ecg abnormal but unchanged from prior. Labs reported OK. Since then cough has improved. Still has scant cough with clear phlegm. No chest pain. Feels ears are stopped up at times. No fever or SOB. Energy level is gradually improving.    Past Medical History:  Diagnosis Date  . CAD (coronary artery disease)    a. s/p  MI in 1988 and CABG 1989;  b. s/p redo CABG 2007;  c. s/p PCI DES to VG->PDA and native Diag;  d. 2010 Cath: occluded VG's to RI and PDA, patent LIMA->LAD, patent stent in diag->med Rx.  . Carotid arterial disease ([facility])    a. s/p bilat  patch angioplasties;  b. 0/9381 RICA 8-29%, LICA 93-71%  . Chronic systolic CHF (congestive heart failure) ([place])    a. 03/2008 EF 30% noted on Myoview.  . CKD (chronic kidney disease), stage III ([place])   . Gout   . History of TIAs 2000  . HTN (hypertension)   . Hypercholesteremia   . Ischemic cardiomyopathy    a. 03/2008 EF 30% noted on Myoview.  . Prostate cancer ([place])   . Rheumatoid arthritis [facility])     Past Surgical History:  Procedure Laterality Date  . BACK SURGERY    . CARDIOVASCULAR STRESS TEST  04-02-08   EF 30%  . CAROTID ENDARTERECTOMY  1992, 2007   LEFT, RIGHT  . CHOLECYSTECTOMY N/A 06/22/2013   Procedure: LAPAROSCOPIC CHOLECYSTECTOMY;  Surgeon: Gayland Curry, MD;  Location: [facility];  Service: General;  Laterality: N/A;  . CORONARY ARTERY BYPASS GRAFT  [phone]  . KNEE ARTHROSCOPY     RIGHT KNEE  . RADIOACTIVE SEED IMPLANT  2005    Current Medications: Outpatient Medications Prior to Visit  Medication Sig Dispense Refill  . allopurinol (ZYLOPRIM) 300 MG tablet TAKE 1 TABLET BY MOUTH EVERY DAY 90 tablet 1  . amLODipine (NORVASC) 10 MG tablet Take 10 mg by mouth daily.      Marland Kitchen aspirin 81 MG tablet Take 81 mg by mouth daily.      Marland Kitchen  atorvastatin (LIPITOR) 40 MG tablet Take 40 mg by mouth 2 (two) times daily.     . candesartan (ATACAND) 32 MG tablet Take 32 mg by mouth daily.    . clopidogrel (PLAVIX) 75 MG tablet TAKE 1 TABLET BY MOUTH EVERY DAY 90 tablet 1  . ergocalciferol (VITAMIN D2) 50000 UNITS capsule Take 50,000 Units by mouth every 30 (thirty) days. Twice monthly 1st and 15th    . hydrALAZINE (APRESOLINE) 25 MG tablet TAKE 1 TABLET BY MOUTH THREE TIMES A DAY 270 tablet 1  . hydrochlorothiazide (MICROZIDE) 12.5 MG capsule Take 12.5 mg by mouth daily.    . isosorbide mononitrate (IMDUR) 30 MG 24 hr tablet TAKE 1 TABLET BY MOUTH TWICE A DAY 180 tablet 2  . ketoconazole (NIZORAL) 2 % shampoo See admin instructions.  6  . leflunomide (ARAVA) 10 MG tablet Take 10 mg by  mouth daily.  3  . metFORMIN (GLUCOPHAGE-XR) 500 MG 24 hr tablet TAKE 1 TABLET BY MOUTH EVERY DAY WITH EVENING MEAL  1  . metoprolol tartrate (LOPRESSOR) 25 MG tablet Take 12.5 mg by mouth 2 (two) times daily.      . nitroGLYCERIN (NITROSTAT) 0.4 MG SL tablet Place 1 tablet (0.4 mg total) under the tongue every 5 (five) minutes as needed. 25 tablet 6  . benazepril (LOTENSIN) 40 MG tablet Take 40 mg by mouth daily.      . predniSONE (DELTASONE) 5 MG tablet Take 7.5 mg by mouth daily.   3   No facility-administered medications prior to visit.      Allergies:   Patient has no known allergies.   Social History   Socioeconomic History  . Marital status: Married    Spouse name: Not on file  . Number of children: 3  . Years of education: Not on file  . Highest education level: Not on file  Occupational History  . 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 Financial resource strain: Not on file  . Food insecurity:    Worry: Not on file    Inability: Not on file  . Transportation needs:    Medical: Not on file    Non-medical: Not on file  Tobacco Use  . Smoking status: Former Smoker    Types: Cigarettes    Last attempt to quit: 09/27/1986    Years since quitting: 32.0  . Smokeless tobacco: Never Used  Substance and Sexual Activity  . Alcohol use: No  . Drug use: No  . Sexual activity: Not on file  Lifestyle  . Physical activity:    Days per week: Not on file    Minutes per session: Not on file  . Stress: Not on file  Relationships  . Social connections:    Talks on phone: Not on file    Gets together: Not on file    Attends religious service: Not on file    Active member of club or organization: Not on file    Attends meetings of clubs or organizations: Not on file    Relationship status: Not on file  Other Topics Concern  . Not on file  Social History Narrative   Lives in [place] with wife.  Works at a [employer] two days/wk and  does water walking 3 days/wk x 30-45 mins per session.     Family History:  The patient's family history includes Cancer (age of onset: 11) in his mother; Other (age of onset: 39) in  his father.   ROS:   Please see the history of present illness.    ROS All other systems reviewed and are negative.   PHYSICAL EXAM:   VS:  BP 138/78   Ht 6\' 1"  (1.854 m)   Wt 171 lb 12.8 oz (77.9 kg)   BMI 22.67 kg/m    GENERAL:  Well appearing WM in NAD HEENT:  PERRL, EOMI, sclera are clear. Oropharynx is clear. NECK:  No jugular venous distention, carotid upstroke brisk and symmetric, no bruits, no thyromegaly or adenopathy LUNGS:  Clear to auscultation bilaterally CHEST:  Unremarkable HEART:  RRR,  PMI not displaced or sustained,S1 and S2 within normal limits, no S3, no S4: no clicks, no rubs, gr 2/6 systolic murmur ABD:  Soft, nontender. BS +, no masses or bruits. No hepatomegaly, no splenomegaly EXT:  2 + pulses throughout, no edema, no cyanosis no clubbing SKIN:  Warm and dry.  No rashes NEURO:  Alert and oriented x 3. Cranial nerves II through XII intact. PSYCH:  Cognitively intact          Wt Readings from Last 3 Encounters:  10/11/18 171 lb 12.8 oz (77.9 kg)  06/28/18 177 lb (80.3 kg)  01/13/18 177 lb 12.8 oz (80.6 kg)      Studies/Labs Reviewed:   EKG:  EKG is not ordered today.     Recent Labs: Labs dated 06/04/16: cholesterol 118, triglycerides 155, HDL 32, LDL 55. A1c 5.9%. BUN 26, creatinine 1.2. Other chemistries normal.  Dated 09/09/16: BUN 36, creatinine 1.69. Glucose 140, ALT 44, AST 51. T. Bili 2.0. Dated 05/11/17: cholesterol 110, triglycerides 119, HDL 29, LDL 57. A1c 5.7%. Hgb 12.1. Creatinine 1.5. Other chemistries and TSH normal.  Dated 11/09/17: cholesterol 122, triglycerides 197, HDL 31, LDL 52. A1c 5.9%.  Dated 12/28/17: CBC normal. Creatinine 1.71. Other chemistries normal.  Dated 04/26/18: creatinine 1.46, Hgb, TSH, ALT normal. Dated 05/24/18: cholesterol 135,  triglycerides 228, HDL 28.  Dated 09/14/18: creatinine 1.18, ALT normal  Lipid Panel    Component Value Date/Time   CHOL 137 11/12/2010   TRIG 151 11/12/2010   HDL 33 (A) 11/12/2010   LDLCALC 74 11/12/2010    Additional studies/ records that were reviewed today include:  Carotid dopples 12/05/17: < 39% bilateral carotid disease.   ASSESSMENT:   1. Coronary disease status post redo CABG in 2007. LIMA graft to the LAD is patent. Other grafts are occluded. Prior stent of the first diagonal. He remains asymptomatic. Stress testing is not very useful since it will be predictably abnormal.  We'll continue with Plavix, isosorbide, amlodipine, or and metoprolol. He is also on a baby aspirin daily. Monitor for increased angina.  2. Hypertension, BP well controlled. Continue Rx.   3. Hypercholesterolemia continue atorvastatin. Excellent control.   4. Chronic congestive heart failure with ischemic cardiomyopathy. Ejection fraction 40-45%. He appears euvolemic. No evidence of volume overload. Continue beta blocker. ACEi switched to ARB due to cough.  5. Carotid arterial disease status post carotid endarterectomy. Carotid dopplers in March 2019 without significant obstruction. Will repeat this March.  6. CKD  stage 3. Stable   7. URI. This has largely resolved. Recommend Claritin for sinus congestion. Will request a copy of labs and CXR from primary care.      Medication Adjustments/Labs and Tests Ordered: Current medicines are reviewed at length with the patient today.  Concerns regarding medicines are outlined above.  Medication changes, Labs and Tests ordered today are listed in the Patient  Instructions below. There are no Patient Instructions on file for this visit.   Signed, Ofilia Rayon Martinique, MD  10/11/2018 2:48 PM    [facility] [address], [place], [place], [address] [phone]

## 2018-10-11 ENCOUNTER — Ambulatory Visit: Payer: Medicare HMO | Admitting: Cardiology

## 2018-10-11 ENCOUNTER — Encounter: Payer: Self-pay | Admitting: Cardiology

## 2018-10-11 VITALS — BP 138/78 | Ht 73.0 in | Wt 171.8 lb

## 2018-10-11 DIAGNOSIS — I6523 Occlusion and stenosis of bilateral carotid arteries: Secondary | ICD-10-CM | POA: Diagnosis not present

## 2018-10-11 DIAGNOSIS — E7800 Pure hypercholesterolemia, unspecified: Secondary | ICD-10-CM

## 2018-10-11 DIAGNOSIS — I5022 Chronic systolic (congestive) heart failure: Secondary | ICD-10-CM | POA: Diagnosis not present

## 2018-10-11 DIAGNOSIS — I25708 Atherosclerosis of coronary artery bypass graft(s), unspecified, with other forms of angina pectoris: Secondary | ICD-10-CM | POA: Diagnosis not present

## 2018-10-22 ENCOUNTER — Encounter

## 2018-10-25 ENCOUNTER — Inpatient Hospital Stay (HOSPITAL_COMMUNITY): Payer: Medicare HMO

## 2018-10-25 ENCOUNTER — Encounter (HOSPITAL_COMMUNITY): Payer: Self-pay | Admitting: *Deleted

## 2018-10-25 ENCOUNTER — Inpatient Hospital Stay (HOSPITAL_COMMUNITY)
Admission: EM | Admit: 2018-10-25 | Discharge: 2018-11-26 | DRG: 246 | Disposition: E | Payer: Medicare HMO | Attending: Cardiovascular Disease | Admitting: Cardiovascular Disease

## 2018-10-25 ENCOUNTER — Encounter (HOSPITAL_COMMUNITY): Admission: EM | Disposition: E | Payer: Self-pay | Source: Home / Self Care | Attending: Cardiovascular Disease

## 2018-10-25 ENCOUNTER — Emergency Department (HOSPITAL_COMMUNITY): Payer: Medicare HMO

## 2018-10-25 ENCOUNTER — Other Ambulatory Visit: Payer: Self-pay

## 2018-10-25 DIAGNOSIS — Z7982 Long term (current) use of aspirin: Secondary | ICD-10-CM

## 2018-10-25 DIAGNOSIS — G9340 Encephalopathy, unspecified: Secondary | ICD-10-CM | POA: Diagnosis present

## 2018-10-25 DIAGNOSIS — I5030 Unspecified diastolic (congestive) heart failure: Secondary | ICD-10-CM | POA: Diagnosis not present

## 2018-10-25 DIAGNOSIS — N183 Chronic kidney disease, stage 3 unspecified: Secondary | ICD-10-CM | POA: Diagnosis present

## 2018-10-25 DIAGNOSIS — I2102 ST elevation (STEMI) myocardial infarction involving left anterior descending coronary artery: Secondary | ICD-10-CM | POA: Diagnosis not present

## 2018-10-25 DIAGNOSIS — Z7984 Long term (current) use of oral hypoglycemic drugs: Secondary | ICD-10-CM

## 2018-10-25 DIAGNOSIS — G253 Myoclonus: Secondary | ICD-10-CM | POA: Diagnosis not present

## 2018-10-25 DIAGNOSIS — I472 Ventricular tachycardia: Secondary | ICD-10-CM | POA: Diagnosis not present

## 2018-10-25 DIAGNOSIS — E440 Moderate protein-calorie malnutrition: Secondary | ICD-10-CM

## 2018-10-25 DIAGNOSIS — R0600 Dyspnea, unspecified: Secondary | ICD-10-CM

## 2018-10-25 DIAGNOSIS — Z955 Presence of coronary angioplasty implant and graft: Secondary | ICD-10-CM | POA: Diagnosis not present

## 2018-10-25 DIAGNOSIS — I469 Cardiac arrest, cause unspecified: Secondary | ICD-10-CM | POA: Diagnosis not present

## 2018-10-25 DIAGNOSIS — I130 Hypertensive heart and chronic kidney disease with heart failure and stage 1 through stage 4 chronic kidney disease, or unspecified chronic kidney disease: Secondary | ICD-10-CM | POA: Diagnosis present

## 2018-10-25 DIAGNOSIS — I5043 Acute on chronic combined systolic (congestive) and diastolic (congestive) heart failure: Secondary | ICD-10-CM | POA: Diagnosis present

## 2018-10-25 DIAGNOSIS — R570 Cardiogenic shock: Secondary | ICD-10-CM | POA: Diagnosis not present

## 2018-10-25 DIAGNOSIS — I2510 Atherosclerotic heart disease of native coronary artery without angina pectoris: Secondary | ICD-10-CM | POA: Diagnosis present

## 2018-10-25 DIAGNOSIS — G9341 Metabolic encephalopathy: Secondary | ICD-10-CM | POA: Diagnosis present

## 2018-10-25 DIAGNOSIS — I480 Paroxysmal atrial fibrillation: Secondary | ICD-10-CM

## 2018-10-25 DIAGNOSIS — Z87891 Personal history of nicotine dependence: Secondary | ICD-10-CM

## 2018-10-25 DIAGNOSIS — E7800 Pure hypercholesterolemia, unspecified: Secondary | ICD-10-CM | POA: Diagnosis present

## 2018-10-25 DIAGNOSIS — D631 Anemia in chronic kidney disease: Secondary | ICD-10-CM | POA: Diagnosis present

## 2018-10-25 DIAGNOSIS — A419 Sepsis, unspecified organism: Secondary | ICD-10-CM | POA: Diagnosis not present

## 2018-10-25 DIAGNOSIS — I25110 Atherosclerotic heart disease of native coronary artery with unstable angina pectoris: Secondary | ICD-10-CM | POA: Diagnosis present

## 2018-10-25 DIAGNOSIS — I459 Conduction disorder, unspecified: Secondary | ICD-10-CM | POA: Diagnosis present

## 2018-10-25 DIAGNOSIS — I25709 Atherosclerosis of coronary artery bypass graft(s), unspecified, with unspecified angina pectoris: Secondary | ICD-10-CM | POA: Diagnosis present

## 2018-10-25 DIAGNOSIS — N170 Acute kidney failure with tubular necrosis: Secondary | ICD-10-CM | POA: Diagnosis not present

## 2018-10-25 DIAGNOSIS — Z452 Encounter for adjustment and management of vascular access device: Secondary | ICD-10-CM

## 2018-10-25 DIAGNOSIS — I110 Hypertensive heart disease with heart failure: Secondary | ICD-10-CM | POA: Diagnosis not present

## 2018-10-25 DIAGNOSIS — I252 Old myocardial infarction: Secondary | ICD-10-CM | POA: Diagnosis not present

## 2018-10-25 DIAGNOSIS — I10 Essential (primary) hypertension: Secondary | ICD-10-CM | POA: Diagnosis present

## 2018-10-25 DIAGNOSIS — I213 ST elevation (STEMI) myocardial infarction of unspecified site: Secondary | ICD-10-CM | POA: Diagnosis not present

## 2018-10-25 DIAGNOSIS — J690 Pneumonitis due to inhalation of food and vomit: Secondary | ICD-10-CM | POA: Diagnosis not present

## 2018-10-25 DIAGNOSIS — M1A9XX0 Chronic gout, unspecified, without tophus (tophi): Secondary | ICD-10-CM | POA: Diagnosis present

## 2018-10-25 DIAGNOSIS — R404 Transient alteration of awareness: Secondary | ICD-10-CM

## 2018-10-25 DIAGNOSIS — I255 Ischemic cardiomyopathy: Secondary | ICD-10-CM | POA: Diagnosis present

## 2018-10-25 DIAGNOSIS — Z8673 Personal history of transient ischemic attack (TIA), and cerebral infarction without residual deficits: Secondary | ICD-10-CM | POA: Diagnosis not present

## 2018-10-25 DIAGNOSIS — R34 Anuria and oliguria: Secondary | ICD-10-CM | POA: Diagnosis not present

## 2018-10-25 DIAGNOSIS — R55 Syncope and collapse: Secondary | ICD-10-CM | POA: Diagnosis present

## 2018-10-25 DIAGNOSIS — Z7902 Long term (current) use of antithrombotics/antiplatelets: Secondary | ICD-10-CM

## 2018-10-25 DIAGNOSIS — R079 Chest pain, unspecified: Secondary | ICD-10-CM

## 2018-10-25 DIAGNOSIS — J9601 Acute respiratory failure with hypoxia: Secondary | ICD-10-CM

## 2018-10-25 DIAGNOSIS — R6521 Severe sepsis with septic shock: Secondary | ICD-10-CM | POA: Diagnosis not present

## 2018-10-25 DIAGNOSIS — E785 Hyperlipidemia, unspecified: Secondary | ICD-10-CM | POA: Diagnosis present

## 2018-10-25 DIAGNOSIS — I639 Cerebral infarction, unspecified: Secondary | ICD-10-CM

## 2018-10-25 DIAGNOSIS — I4901 Ventricular fibrillation: Secondary | ICD-10-CM | POA: Diagnosis not present

## 2018-10-25 DIAGNOSIS — I340 Nonrheumatic mitral (valve) insufficiency: Secondary | ICD-10-CM | POA: Diagnosis not present

## 2018-10-25 DIAGNOSIS — I6529 Occlusion and stenosis of unspecified carotid artery: Secondary | ICD-10-CM | POA: Diagnosis present

## 2018-10-25 DIAGNOSIS — Z8546 Personal history of malignant neoplasm of prostate: Secondary | ICD-10-CM | POA: Diagnosis not present

## 2018-10-25 DIAGNOSIS — Z515 Encounter for palliative care: Secondary | ICD-10-CM | POA: Diagnosis not present

## 2018-10-25 DIAGNOSIS — R0902 Hypoxemia: Secondary | ICD-10-CM | POA: Diagnosis not present

## 2018-10-25 DIAGNOSIS — I501 Left ventricular failure: Secondary | ICD-10-CM | POA: Diagnosis present

## 2018-10-25 DIAGNOSIS — Z809 Family history of malignant neoplasm, unspecified: Secondary | ICD-10-CM

## 2018-10-25 DIAGNOSIS — Z9049 Acquired absence of other specified parts of digestive tract: Secondary | ICD-10-CM | POA: Diagnosis not present

## 2018-10-25 DIAGNOSIS — Z9289 Personal history of other medical treatment: Secondary | ICD-10-CM

## 2018-10-25 DIAGNOSIS — I4891 Unspecified atrial fibrillation: Secondary | ICD-10-CM | POA: Diagnosis not present

## 2018-10-25 DIAGNOSIS — E1169 Type 2 diabetes mellitus with other specified complication: Secondary | ICD-10-CM | POA: Diagnosis present

## 2018-10-25 DIAGNOSIS — E876 Hypokalemia: Secondary | ICD-10-CM | POA: Diagnosis not present

## 2018-10-25 DIAGNOSIS — I428 Other cardiomyopathies: Secondary | ICD-10-CM | POA: Diagnosis not present

## 2018-10-25 DIAGNOSIS — M109 Gout, unspecified: Secondary | ICD-10-CM | POA: Diagnosis present

## 2018-10-25 DIAGNOSIS — J9690 Respiratory failure, unspecified, unspecified whether with hypoxia or hypercapnia: Secondary | ICD-10-CM

## 2018-10-25 DIAGNOSIS — E1142 Type 2 diabetes mellitus with diabetic polyneuropathy: Secondary | ICD-10-CM | POA: Diagnosis present

## 2018-10-25 DIAGNOSIS — M069 Rheumatoid arthritis, unspecified: Secondary | ICD-10-CM | POA: Diagnosis present

## 2018-10-25 DIAGNOSIS — E1122 Type 2 diabetes mellitus with diabetic chronic kidney disease: Secondary | ICD-10-CM | POA: Diagnosis present

## 2018-10-25 DIAGNOSIS — R188 Other ascites: Secondary | ICD-10-CM

## 2018-10-25 DIAGNOSIS — E1165 Type 2 diabetes mellitus with hyperglycemia: Secondary | ICD-10-CM | POA: Diagnosis not present

## 2018-10-25 HISTORY — PX: CORONARY/GRAFT ACUTE MI REVASCULARIZATION: CATH118305

## 2018-10-25 HISTORY — PX: LEFT HEART CATH AND CORONARY ANGIOGRAPHY: CATH118249

## 2018-10-25 LAB — CBC WITH DIFFERENTIAL/PLATELET
Abs Immature Granulocytes: 0.04 10*3/uL (ref 0.00–0.07)
Basophils Absolute: 0 10*3/uL (ref 0.0–0.1)
Basophils Relative: 1 %
EOS ABS: 0.1 10*3/uL (ref 0.0–0.5)
EOS PCT: 1 %
HCT: 35.5 % — ABNORMAL LOW (ref 39.0–52.0)
Hemoglobin: 10.9 g/dL — ABNORMAL LOW (ref 13.0–17.0)
Immature Granulocytes: 1 %
LYMPHS ABS: 2.6 10*3/uL (ref 0.7–4.0)
LYMPHS PCT: 37 %
MCH: 27.5 pg (ref 26.0–34.0)
MCHC: 30.7 g/dL (ref 30.0–36.0)
MCV: 89.6 fL (ref 80.0–100.0)
Monocytes Absolute: 0.1 10*3/uL (ref 0.1–1.0)
Monocytes Relative: 2 %
Neutro Abs: 4.2 10*3/uL (ref 1.7–7.7)
Neutrophils Relative %: 58 %
Platelets: 288 10*3/uL (ref 150–400)
RBC: 3.96 MIL/uL — ABNORMAL LOW (ref 4.22–5.81)
RDW: 16.4 % — AB (ref 11.5–15.5)
WBC: 7 10*3/uL (ref 4.0–10.5)
nRBC: 0 % (ref 0.0–0.2)

## 2018-10-25 LAB — DIFFERENTIAL
Abs Immature Granulocytes: 0.02 10*3/uL (ref 0.00–0.07)
Basophils Absolute: 0 10*3/uL (ref 0.0–0.1)
Basophils Relative: 1 %
Eosinophils Absolute: 0.1 10*3/uL (ref 0.0–0.5)
Eosinophils Relative: 2 %
Immature Granulocytes: 0 %
Lymphocytes Relative: 18 %
Lymphs Abs: 1.2 10*3/uL (ref 0.7–4.0)
Monocytes Absolute: 0.2 10*3/uL (ref 0.1–1.0)
Monocytes Relative: 3 %
Neutro Abs: 5 10*3/uL (ref 1.7–7.7)
Neutrophils Relative %: 76 %

## 2018-10-25 LAB — CBC
HCT: 36.4 % — ABNORMAL LOW (ref 39.0–52.0)
Hemoglobin: 11.3 g/dL — ABNORMAL LOW (ref 13.0–17.0)
MCH: 27.4 pg (ref 26.0–34.0)
MCHC: 31 g/dL (ref 30.0–36.0)
MCV: 88.3 fL (ref 80.0–100.0)
Platelets: 214 10*3/uL (ref 150–400)
RBC: 4.12 MIL/uL — AB (ref 4.22–5.81)
RDW: 16.1 % — ABNORMAL HIGH (ref 11.5–15.5)
WBC: 6.6 10*3/uL (ref 4.0–10.5)
nRBC: 0 % (ref 0.0–0.2)

## 2018-10-25 LAB — COMPREHENSIVE METABOLIC PANEL
ALT: 16 U/L (ref 0–44)
ALT: 63 U/L — ABNORMAL HIGH (ref 0–44)
AST: 34 U/L (ref 15–41)
AST: 95 U/L — ABNORMAL HIGH (ref 15–41)
Albumin: 3 g/dL — ABNORMAL LOW (ref 3.5–5.0)
Albumin: 2.9 g/dL — ABNORMAL LOW (ref 3.5–5.0)
Alkaline Phosphatase: 83 U/L (ref 38–126)
Alkaline Phosphatase: 81 U/L (ref 38–126)
Anion gap: 12 (ref 5–15)
Anion gap: 13 (ref 5–15)
BUN: 27 mg/dL — AB (ref 8–23)
BUN: 24 mg/dL — ABNORMAL HIGH (ref 8–23)
CO2: 22 mmol/L (ref 22–32)
CO2: 21 mmol/L — ABNORMAL LOW (ref 22–32)
CREATININE: 1.55 mg/dL — AB (ref 0.61–1.24)
Calcium: 8.3 mg/dL — ABNORMAL LOW (ref 8.9–10.3)
Calcium: 8.2 mg/dL — ABNORMAL LOW (ref 8.9–10.3)
Chloride: 102 mmol/L (ref 98–111)
Chloride: 100 mmol/L (ref 98–111)
Creatinine, Ser: 1.58 mg/dL — ABNORMAL HIGH (ref 0.61–1.24)
GFR calc Af Amer: 46 mL/min — ABNORMAL LOW (ref >60)
GFR calc Af Amer: 47 mL/min — ABNORMAL LOW (ref >60)
GFR calc non Af Amer: 41 mL/min — ABNORMAL LOW (ref >60)
GFR, EST NON AFRICAN AMERICAN: 40 mL/min — AB (ref >60)
Glucose, Bld: 208 mg/dL — ABNORMAL HIGH (ref 70–99)
Glucose, Bld: 189 mg/dL — ABNORMAL HIGH (ref 70–99)
Potassium: 3.1 mmol/L — ABNORMAL LOW (ref 3.5–5.1)
Potassium: 3.1 mmol/L — ABNORMAL LOW (ref 3.5–5.1)
Sodium: 136 mmol/L (ref 135–145)
Sodium: 134 mmol/L — ABNORMAL LOW (ref 135–145)
Total Bilirubin: 1 mg/dL (ref 0.3–1.2)
Total Bilirubin: 0.6 mg/dL (ref 0.3–1.2)
Total Protein: 6 g/dL — ABNORMAL LOW (ref 6.5–8.1)
Total Protein: 5.8 g/dL — ABNORMAL LOW (ref 6.5–8.1)

## 2018-10-25 LAB — PROTIME-INR
INR: 1.08
Prothrombin Time: 13.9 seconds (ref 11.4–15.2)

## 2018-10-25 LAB — MAGNESIUM
Magnesium: 2.2 mg/dL (ref 1.7–2.4)
Magnesium: 2.2 mg/dL (ref 1.7–2.4)

## 2018-10-25 LAB — HEPARIN LEVEL (UNFRACTIONATED): Heparin Unfractionated: 0.1 IU/mL — ABNORMAL LOW (ref 0.30–0.70)

## 2018-10-25 LAB — TROPONIN I
Troponin I: 0.07 ng/mL (ref <0.03)
Troponin I: 0.19 ng/mL (ref <0.03)
Troponin I: 0.94 ng/mL (ref <0.03)

## 2018-10-25 LAB — I-STAT TROPONIN, ED: TROPONIN I, POC: 0.04 ng/mL (ref 0.00–0.08)

## 2018-10-25 LAB — APTT: aPTT: 23 seconds — ABNORMAL LOW (ref 24–36)

## 2018-10-25 LAB — TSH: TSH: 2.349 u[IU]/mL (ref 0.350–4.500)

## 2018-10-25 SURGERY — CORONARY/GRAFT ACUTE MI REVASCULARIZATION
Anesthesia: LOCAL

## 2018-10-25 MED ORDER — AMIODARONE HCL IN DEXTROSE 360-4.14 MG/200ML-% IV SOLN
60.0000 mg/h | INTRAVENOUS | Status: AC
  Administered 2018-10-25: 60 mg/h via INTRAVENOUS
  Filled 2018-10-25 (×3): qty 200

## 2018-10-25 MED ORDER — POTASSIUM CHLORIDE IN NACL 40-0.9 MEQ/L-% IV SOLN: INTRAVENOUS | Status: DC

## 2018-10-25 MED ORDER — ACETAMINOPHEN 650 MG RE SUPP: 650.0000 mg | RECTAL | Status: DC | PRN

## 2018-10-25 MED ORDER — METHOTREXATE 2.5 MG PO TABS: 10.0000 mg | ORAL_TABLET | ORAL | Status: DC

## 2018-10-25 MED ORDER — ALLOPURINOL 300 MG PO TABS
300.0000 mg | ORAL_TABLET | Freq: Every day | ORAL | Status: DC
  Administered 2018-10-26 – 2018-10-27 (×2): 300 mg via ORAL
  Filled 2018-10-25 (×4): qty 1

## 2018-10-25 MED ORDER — ACETAMINOPHEN 325 MG PO TABS
650.0000 mg | ORAL_TABLET | ORAL | Status: DC | PRN
  Administered 2018-10-25: 650 mg via ORAL
  Filled 2018-10-25: qty 2

## 2018-10-25 MED ORDER — POTASSIUM CHLORIDE CRYS ER 20 MEQ PO TBCR
40.0000 meq | EXTENDED_RELEASE_TABLET | Freq: Once | ORAL | Status: DC
  Filled 2018-10-25: qty 2

## 2018-10-25 MED ORDER — ASPIRIN 81 MG PO CHEW
81.0000 mg | CHEWABLE_TABLET | Freq: Every day | ORAL | Status: DC
  Administered 2018-10-25: 81 mg via ORAL
  Filled 2018-10-25: qty 1

## 2018-10-25 MED ORDER — PANTOPRAZOLE SODIUM 40 MG PO TBEC
40.0000 mg | DELAYED_RELEASE_TABLET | Freq: Every day | ORAL | Status: DC
  Filled 2018-10-25: qty 1

## 2018-10-25 MED ORDER — ACETAMINOPHEN 160 MG/5ML PO SOLN: 650.0000 mg | ORAL | Status: DC | PRN

## 2018-10-25 MED ORDER — HEPARIN (PORCINE) 25000 UT/250ML-% IV SOLN
1150.0000 [IU]/h | INTRAVENOUS | Status: DC
  Administered 2018-10-25: 1000 [IU]/h via INTRAVENOUS
  Filled 2018-10-25: qty 250

## 2018-10-25 MED ORDER — SODIUM CHLORIDE 0.9 % IV SOLN: INTRAVENOUS | Status: DC | PRN

## 2018-10-25 MED ORDER — POTASSIUM CHLORIDE 10 MEQ/100ML IV SOLN
10.0000 meq | INTRAVENOUS | Status: AC
  Administered 2018-10-25: 10 meq via INTRAVENOUS
  Filled 2018-10-25 (×2): qty 100

## 2018-10-25 MED ORDER — AMIODARONE LOAD VIA INFUSION
150.0000 mg | Freq: Once | INTRAVENOUS | Status: AC
  Administered 2018-10-25: 150 mg via INTRAVENOUS
  Filled 2018-10-25: qty 83.34

## 2018-10-25 MED ORDER — METOPROLOL TARTRATE 25 MG PO TABS
25.0000 mg | ORAL_TABLET | Freq: Two times a day (BID) | ORAL | Status: DC
  Filled 2018-10-25 (×2): qty 1

## 2018-10-25 MED ORDER — POTASSIUM CHLORIDE CRYS ER 20 MEQ PO TBCR
40.0000 meq | EXTENDED_RELEASE_TABLET | Freq: Once | ORAL | Status: AC
  Administered 2018-10-25: 40 meq via ORAL
  Filled 2018-10-25: qty 2

## 2018-10-25 MED ORDER — FOLIC ACID 1 MG PO TABS
0.5000 mg | ORAL_TABLET | Freq: Every day | ORAL | Status: DC
  Filled 2018-10-25: qty 1

## 2018-10-25 MED ORDER — CLOPIDOGREL BISULFATE 75 MG PO TABS: 75.0000 mg | ORAL_TABLET | Freq: Every day | ORAL | Status: DC

## 2018-10-25 MED ORDER — LEFLUNOMIDE 20 MG PO TABS
10.0000 mg | ORAL_TABLET | Freq: Every day | ORAL | Status: DC
  Filled 2018-10-25 (×2): qty 1

## 2018-10-25 MED ORDER — HYDRALAZINE HCL 20 MG/ML IJ SOLN: 10.0000 mg | INTRAMUSCULAR | Status: DC | PRN

## 2018-10-25 MED ORDER — AMIODARONE HCL IN DEXTROSE 360-4.14 MG/200ML-% IV SOLN
30.0000 mg/h | INTRAVENOUS | Status: DC
  Administered 2018-10-26 – 2018-10-28 (×7): 30 mg/h via INTRAVENOUS
  Filled 2018-10-25 (×9): qty 200

## 2018-10-25 MED ORDER — ATORVASTATIN CALCIUM 40 MG PO TABS
40.0000 mg | ORAL_TABLET | Freq: Two times a day (BID) | ORAL | Status: DC
  Administered 2018-10-25: 40 mg via ORAL
  Filled 2018-10-25: qty 1

## 2018-10-25 MED ORDER — FENTANYL CITRATE (PF) 100 MCG/2ML IJ SOLN
25.0000 ug | INTRAMUSCULAR | Status: DC | PRN
  Administered 2018-10-25: 25 ug via INTRAVENOUS
  Filled 2018-10-25: qty 2

## 2018-10-25 MED ORDER — SODIUM CHLORIDE 0.9 % IV SOLN
INTRAVENOUS | Status: DC
  Administered 2018-10-25: 19:00:00 via INTRAVENOUS

## 2018-10-25 MED ORDER — KETOROLAC TROMETHAMINE 15 MG/ML IJ SOLN
15.0000 mg | Freq: Once | INTRAMUSCULAR | Status: AC
  Administered 2018-10-25: 15 mg via INTRAVENOUS
  Filled 2018-10-25: qty 1

## 2018-10-25 MED ORDER — STROKE: EARLY STAGES OF RECOVERY BOOK
Freq: Once | Status: AC
  Administered 2018-10-25: 19:00:00
  Filled 2018-10-25 (×2): qty 1

## 2018-10-25 MED ORDER — PROCHLORPERAZINE EDISYLATE 10 MG/2ML IJ SOLN
7.5000 mg | Freq: Once | INTRAMUSCULAR | Status: AC
  Administered 2018-10-25: 7.5 mg via INTRAVENOUS
  Filled 2018-10-25: qty 2

## 2018-10-25 SURGICAL SUPPLY — 20 items
BALLN EMERGE MR 2.0X12 (BALLOONS) ×2
BALLN SAPPHIRE ~~LOC~~ 2.5X15 (BALLOONS) ×1 IMPLANT
BALLOON EMERGE MR 2.0X12 (BALLOONS) IMPLANT
CATH INFINITI 5 FR IM (CATHETERS) ×1 IMPLANT
CATH INFINITI 5 FR LCB (CATHETERS) ×1 IMPLANT
CATH INFINITI 5FR MULTPACK ANG (CATHETERS) ×1 IMPLANT
CATH VISTA GUIDE 6FR IM 90 CM (CATHETERS) ×1 IMPLANT
GLIDESHEATH SLEND SS 6F .021 (SHEATH) ×1 IMPLANT
GUIDEWIRE INQWIRE 1.5J.035X260 (WIRE) IMPLANT
INQWIRE 1.5J .035X260CM (WIRE) ×2
KIT ENCORE 26 ADVANTAGE (KITS) ×1 IMPLANT
KIT HEART LEFT (KITS) ×2 IMPLANT
PACK CARDIAC CATHETERIZATION (CUSTOM PROCEDURE TRAY) ×2 IMPLANT
SHEATH PINNACLE 6F 10CM (SHEATH) ×3 IMPLANT
STENT SYNERGY DES 2.5X20 (Permanent Stent) ×1 IMPLANT
TRANSDUCER W/STOPCOCK (MISCELLANEOUS) ×2 IMPLANT
TUBING CIL FLEX 10 FLL-RA (TUBING) ×2 IMPLANT
WIRE AQUATRAK .035X150 ANG (WIRE) ×1 IMPLANT
WIRE EMERALD 3MM-J .035X150CM (WIRE) ×1 IMPLANT
WIRE PT2 MS 185 (WIRE) ×1 IMPLANT

## 2018-10-25 NOTE — ED Triage Notes (Signed)
Pt brought in by [facility] for c/o loc; wife states when she came in from outside pt was sitting in chair and talking, she walked out of the room and came back to find pt unresponsive; when ems arrived pt was found to be unresponsive and heart rate in the low 60'; pt was given 0.4mg  of narcan and became less lethargic; pt arrived alert and was able to give his name and dob but unable to state where he was

## 2018-10-25 NOTE — ED Notes (Signed)
Called and Beeped out Code Stroke.

## 2018-10-25 NOTE — Progress Notes (Signed)
ANTICOAGULATION CONSULT NOTE  Pharmacy Consult for Heparin Indication: atrial fibrillation, elevated troponin   No Known Allergies  Patient Measurements: Height: 6\' 1"  (185.4 cm) Weight: 171 lb 11.8 oz (77.9 kg) IBW/kg (Calculated) : 79.9 HEPARIN DW (KG): 77.9  Vital Signs: Temp: 98 F (36.7 C) (01/29 1909) Temp Source: Oral (01/29 1909) BP: 152/81 (01/29 1909) Pulse Rate: 56 (01/29 1909)  Labs: Recent Labs    10/20/2018 1314 09/27/2018 1539 10/02/2018 2126  HGB 11.3*  --  10.9*  HCT 36.4*  --  35.5*  PLT 214  --  288  APTT 23*  --   --   LABPROT 13.9  --   --   INR 1.08  --   --   HEPARINUNFRC  --   --  0.10*  CREATININE 1.58*  --  1.55*  TROPONINI 0.07* 0.19* 0.94*    Estimated Creatinine Clearance: 39.1 mL/min (A) (by C-G formula based on SCr of 1.55 mg/dL (H)).   Medical History: Past Medical History:  Diagnosis Date  . CAD (coronary artery disease)    a. s/p  MI in 1988 and CABG 1989;  b. s/p redo CABG 2007;  c. s/p PCI DES to VG->PDA and native Diag;  d. 2010 Cath: occluded VG's to RI and PDA, patent LIMA->LAD, patent stent in diag->med Rx.  . Carotid arterial disease ([place])    a. s/p bilat patch angioplasties;  b. 10/7515 RICA 0-01%, LICA 74-94%  . Chronic systolic CHF (congestive heart failure) ([place])    a. 03/2008 EF 30% noted on Myoview.  . CKD (chronic kidney disease), stage III ([place])   . Gout   . History of TIAs 2000  . HTN (hypertension)   . Hypercholesteremia   . Ischemic cardiomyopathy    a. 03/2008 EF 30% noted on Myoview.  . Prostate cancer ([place])   . Rheumatoid arthritis ([place])     Medications:  See med rec  Assessment: Patient brought in by EMS due to loss of consciousness.   In addition, MD performed assessment on this patient for suspected stroke which neurology team assessed and no not think this was a focal stroke. (likely d/t tachydysrhythmia that led to change in mental status) Pt also has atrial fib, elevated troponin and pharmacy asked to  start heparin. With questionable stroke, will not give an initial heparin bolus.  1/29 PM update: heparin level sub-therapeutic, Hgb 10.9  Goal of Therapy:  Heparin level 0.3-0.7 units/ml Monitor platelets by anticoagulation protocol: Yes   Plan:  Inc heparin to 1150 units/hr Re-check heparin level in 8 hours  Narda Bonds, PharmD, [facility] Pharmacist Phone: [phone]

## 2018-10-25 NOTE — H&P (Signed)
Cardiology Admission History and Physical:   Patient ID: [name] [name] MRN: [id number]; DOB: 1932/08/02   Admission date: 10/24/2018  Primary Care Provider: Merrilee Seashore, MD Primary Cardiologist: No primary care provider on file.  Primary Electrophysiologist:  None   Chief Complaint:  VT arrest  HPI:   [name] is a 85 y.o. male with CAD s/p CABG (1989 with redo in 2007) and PCI (2010) [as of 2010 LIMA -> LAD patent, SVG to Ramus, PDA occluded, stent in native diagonal and in SVG -> diag patent], carotid disease s/p CEA, CKD III (Cr 1.5), ICM (EF 40-45%), and HTN who presented this afternoon with AMS (resolved) and subsequently suffered a VT/VF arrest with post-ROSC EKG demonstrative of STE in the anterior leads.  Briefly, the patient was in his usual state of health until earlier today, when his wife found his altered. She reported he didn't recognize her and was behaving strangely. Per initial H&P patient was noted to be dyspneic and unconscious with pinpoint pupils and was diaphoretic. They were able to feel a pulse but were unable to get a blood pressure. He was given Narcan en route to the ER without much effect.   In the ER, he had returned to his baseline mental status. He was seen by neurology who did not feel that his presentation was consistent with a CVA or TIA. Initial EKG demonstrated atrial fibrillation (?new) with frequent ectopy and troponin was elevated to 0.07 (but pt. Without CP). He was started on a heparin gtt and admitted to the hospitalist service for further work up.   While on the floor, patient suffered VT/VF arrest requiring 1 min of ACLS and 1 shock before return of pulse. Patient was reported back to his baseline mental status. He was transferred to the ICU for further monitoring and cardiology was called for recommendations. Repeat EKG performed at this time and revealed anterior ST elevations for which a code STEMI was activated.   He was  taken emergently to the cath lab and was found to have an occluded native RCA, LCx, a patent DES to the diagonal, and a 90% mLAD lesion beyond the site of LIMA anastomosis. His SVG to the ramus and PDA were occluded.   Per his wife, he has had decreased exercise tolerance x weeks - months (he was previously active, exercising 3-4 x a week and playing golf). He had also complained of a chronic cough since last November, despite multiple rx's for URI, bronchitis, etc.   Past Medical History:  Diagnosis Date  . CAD (coronary artery disease)    a. s/p  MI in 1988 and CABG 1989;  b. s/p redo CABG 2007;  c. s/p PCI DES to VG->PDA and native Diag;  d. 2010 Cath: occluded VG's to RI and PDA, patent LIMA->LAD, patent stent in diag->med Rx.  . Carotid arterial disease ([place])    a. s/p bilat patch angioplasties;  b. 0/6301 RICA 6-01%, LICA 09-32%  . Chronic systolic CHF (congestive heart failure) ([place])    a. 03/2008 EF 30% noted on Myoview.  . CKD (chronic kidney disease), stage III ([place])   . Gout   . History of TIAs 2000  . HTN (hypertension)   . Hypercholesteremia   . Ischemic cardiomyopathy    a. 03/2008 EF 30% noted on Myoview.  . Prostate cancer ([place])   . Rheumatoid arthritis [facility])     Past Surgical History:  Procedure Laterality Date  . BACK SURGERY    .  CARDIOVASCULAR STRESS TEST  04-02-08   EF 30%  . CAROTID ENDARTERECTOMY  1992, 2007   LEFT, RIGHT  . CHOLECYSTECTOMY N/A 06/22/2013   Procedure: LAPAROSCOPIC CHOLECYSTECTOMY;  Surgeon: Gayland Curry, MD;  Location: [place];  Service: General;  Laterality: N/A;  . CORONARY ARTERY BYPASS GRAFT  [phone]  . KNEE ARTHROSCOPY     RIGHT KNEE  . RADIOACTIVE SEED IMPLANT  2005     Medications Prior to Admission: Prior to Admission medications   Medication Sig Start Date End Date Taking? Authorizing Provider  allopurinol (ZYLOPRIM) 300 MG tablet TAKE 1 TABLET BY MOUTH EVERY DAY 10/31/15  Yes Martinique, Peter M, MD  amLODipine (NORVASC) 10 MG tablet  Take 10 mg by mouth daily.     Yes [provider]  aspirin 81 MG tablet Take 81 mg by mouth daily.     Yes [provider]  atorvastatin (LIPITOR) 40 MG tablet Take 40 mg by mouth 2 (two) times daily.    Yes [provider]  clopidogrel (PLAVIX) 75 MG tablet TAKE 1 TABLET BY MOUTH EVERY DAY 06/23/18  Yes Martinique, Peter M, MD  folic acid (FOLVITE) 0.5 MG tablet Take 0.5 mg by mouth daily.   Yes [provider]  hydrALAZINE (APRESOLINE) 25 MG tablet TAKE 1 TABLET BY MOUTH THREE TIMES A DAY 06/22/18  Yes [place], Peter M, MD  hydrochlorothiazide (MICROZIDE) 12.5 MG capsule Take 12.5 mg by mouth daily.   Yes [provider]  isosorbide mononitrate (IMDUR) 30 MG 24 hr tablet TAKE 1 TABLET BY MOUTH TWICE A DAY 08/14/18  Yes [place]ique, Peter M, MD  ketoconazole (NIZORAL) 2 % shampoo See admin instructions. 04/24/18  Yes [provider]  leflunomide (ARAVA) 10 MG tablet Take 10 mg by mouth daily. 03/29/18  Yes [provider]  metFORMIN (GLUCOPHAGE-XR) 500 MG 24 hr tablet TAKE 1 TABLET BY MOUTH EVERY DAY WITH EVENING MEAL 03/09/18  Yes [provider]  metoprolol tartrate (LOPRESSOR) 25 MG tablet Take 12.5 mg by mouth 2 (two) times daily.     Yes [provider]  azithromycin (ZITHROMAX) 250 MG tablet Take 1 tablet by mouth 2 (two) times daily. 08/25/18   [provider]  desonide (DESOWEN) 0.05 % lotion Apply 1 application topically as needed.  08/13/18   [provider]  ergocalciferol (VITAMIN D2) 50000 UNITS capsule Take 50,000 Units by mouth every 30 (thirty) days. Twice monthly 1st and 15th    [provider]  methotrexate (RHEUMATREX) 2.5 MG tablet Take 4 tablets by mouth once a week. On mondays 08/18/18   [provider]  nitroGLYCERIN (NITROSTAT) 0.4 MG SL tablet Place 1 tablet (0.4 mg total) under the tongue every 5 (five) minutes as needed. 06/25/16   Martinique, Peter M, MD     Allergies:   No  Known Allergies  Social History:   Social History   Socioeconomic History  . Marital status: Married    Spouse name: Not on file  . Number of children: 3  . Years of education: Not on file  . Highest education level: Not on file  Occupational History  . 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 Financial resource strain: Not on file  . Food insecurity:    Worry: Not on file    Inability: Not on file  . Transportation needs:    Medical: Not on file    Non-medical: Not on file  Tobacco Use  .  Smoking status: Former Smoker    Types: Cigarettes    Last attempt to quit: 09/27/1986    Years since quitting: 32.0  . Smokeless tobacco: Never Used  Substance and Sexual Activity  . Alcohol use: No  . Drug use: No  . Sexual activity: Not on file  Lifestyle  . Physical activity:    Days per week: Not on file    Minutes per session: Not on file  . Stress: Not on file  Relationships  . Social connections:    Talks on phone: Not on file    Gets together: Not on file    Attends religious service: Not on file    Active member of club or organization: Not on file    Attends meetings of clubs or organizations: Not on file    Relationship status: Not on file  . Intimate partner violence:    Fear of current or ex partner: Not on file    Emotionally abused: Not on file    Physically abused: Not on file    Forced sexual activity: Not on file  Other Topics Concern  . Not on file  Social History Narrative   Lives in [place] with wife.  Works at a [employer] two days/wk and does water walking 3 days/wk x 30-45 mins per session.    Family History:   The patient's family history includes Cancer (age of onset: 33) in his mother; Other (age of onset: 45) in his father.    Review of Systems: [y] = yes, [ ]  = no     General: Weight gain [ ] ; Weight loss [ ] ; Anorexia [ ] ; Fatigue Blue.Reese ]; Fever [ ] ; Chills [ ] ; Weakness [ ]    Cardiac: Chest  pain/pressure Blue.Reese ]; Resting SOB [ ] ; Exertional SOB [ ] ; Orthopnea [ ] ; Pedal Edema [ ] ; Palpitations [ ] ; Syncope Blue.Reese ]; Presyncope [ ] ; Paroxysmal nocturnal dyspnea[ ]    Pulmonary: Cough Blue.Reese ]; Wheezing[ ] ; Hemoptysis[ ] ; Sputum [ ] ; Snoring [ ]    GI: Vomiting[ ] ; Dysphagia[ ] ; Melena[ ] ; Hematochezia [ ] ; Heartburn[ ] ; Abdominal pain [ ] ; Constipation [ ] ; Diarrhea [ ] ; BRBPR [ ]    GU: Hematuria[ ] ; Dysuria [ ] ; Nocturia[ ]    Vascular: Pain in legs with walking [ ] ; Pain in feet with lying flat [ ] ; Non-healing sores [ ] ; Stroke [ ] ; TIA [ ] ; Slurred speech [ ] ;   Neuro: Headaches[ ] ; Vertigo[ ] ; Seizures[ ] ; Paresthesias[ ] ;Blurred vision [ ] ; Diplopia [ ] ; Vision changes [ ]    Ortho/Skin: Arthritis [ ] ; Joint pain [ ] ; Muscle pain [ ] ; Joint swelling [ ] ; Back Pain [ ] ; Rash [ ]    Psych: Depression[ ] ; Anxiety[ ]    Heme: Bleeding problems [ ] ; Clotting disorders [ ] ; Anemia [ ]    Endocrine: Diabetes [ ] ; Thyroid dysfunction[ ]   Physical Exam/Data:   Vitals:   10/23/2018 2300 10/24/2018 2315 10/18/2018 2330 10/26/18 0053  BP: 118/74 112/72 132/76   Pulse: (!) 43 (!) 101 (!) 54   Resp: (!) 25 15 (!) 29   Temp:      TempSrc:      SpO2: 100% 99% 99% 90%  Weight:      Height:       No intake or output data in the 24 hours ending 10/08/2018 2348 Filed Weights   10/03/2018 1350  Weight: 77.9 kg   Body mass index is 22.66 kg/m.   General:  Well  nourished, well developed, in no acute distress.  HEENT: normal Lymph: no adenopathy Neck: no JVD Endocrine:  No thryomegaly Vascular: No carotid bruits; FA pulses 2+ bilaterally without bruits. 2+ radial pulses.  Cardiac:  normal S1, S2; irregularly irregular; no murmur Lungs:  clear to auscultation bilaterally, no wheezing, rhonchi or rales anteriorly (unable to auscultate posteriorly as sheath in place).  Abd: soft, nontender, no hepatomegaly  Ext: no LE edema Musculoskeletal:  No deformities, BUE and BLE strength normal and equal Skin:  warm and dry  Neuro:  CNs 2-12 intact, no focal abnormalities noted Psych:  Normal affect   EKG:  The ECG that was done was personally reviewed and demonstrates STE V2-V4 with more subtle ST changes inferiorly.   Relevant CV Studies: TTE 05/2013: Study Conclusions  Left ventricle: The cavity size was mildly dilated. Systolic function was mildly to moderately reduced. The estimated ejection fraction was in the range of 40% to 45%. Akinesis of the midanteroseptal myocardium. Akinesis of the basalinferior myocardium. There was an increased relative contribution of atrial contraction to ventricular filling.  Carotid Doppler 11/2017: Final Interpretation: Right Carotid: Velocities in the right ICA are consistent with a 1-39% stenosis.                Non-hemodynamically significant plaque <50% noted in the CCA. The                ECA appears >50% stenosed.  Left Carotid: Velocities in the left ICA are consistent with a 1-39% stenosis.               Non-hemodynamically significant plaque noted in the CCA.  Vertebrals:  Bilateral vertebral arteries demonstrate antegrade flow. Subclavians: Normal flow hemodynamics were seen in bilateral subclavian              arteries.  Laboratory Data:  Chemistry Recent Labs  Lab 10/08/2018 1314 10/02/2018 2126  NA 136 134*  K 3.1* 3.1*  CL 102 100  CO2 22 21*  GLUCOSE 208* 189*  BUN 27* 24*  CREATININE 1.58* 1.55*  CALCIUM 8.3* 8.2*  GFRNONAA 40* 41*  GFRAA 46* 47*  ANIONGAP 12 13    Recent Labs  Lab 10/15/2018 1314 10/16/2018 2126  PROT 6.0* 5.8*  ALBUMIN 3.0* 2.9*  AST 34 95*  ALT 16 63*  ALKPHOS 83 81  BILITOT 1.0 0.6   Hematology Recent Labs  Lab 10/09/2018 1314 10/20/2018 2126  WBC 6.6 7.0  RBC 4.12* 3.96*  HGB 11.3* 10.9*  HCT 36.4* 35.5*  MCV 88.3 89.6  MCH 27.4 27.5  MCHC 31.0 30.7  RDW 16.1* 16.4*  PLT 214 288   Cardiac Enzymes Recent Labs  Lab 10/10/2018 1314 10/26/2018 1539 09/30/2018 2126  TROPONINI 0.07* 0.19*  0.94*    Recent Labs  Lab 10/03/2018 1316  TROPIPOC 0.04    BNPNo results for input(s): BNP, PROBNP in the last 168 hours.  DDimer No results for input(s): DDIMER in the last 168 hours.  Radiology/Studies:  Mr Herby Abraham Contrast  Result Date: 10/07/2018 CLINICAL DATA:  Altered level of consciousness. Prostate cancer history. EXAM: MRI HEAD WITHOUT CONTRAST TECHNIQUE: Multiplanar, multiecho pulse sequences of the brain and surrounding structures were obtained without intravenous contrast. COMPARISON:  CT head 10/08/2018 FINDINGS: Brain: Moderate atrophy. Chronic hemorrhagic infarction left basal ganglia/external capsule. Mild chronic microvascular ischemic change elsewhere in the white matter. Negative for acute infarct. Negative for mass or edema. No midline shift. Vascular: Normal arterial flow void Skull and upper cervical  spine: Negative Sinuses/Orbits: Moderate mucosal edema throughout the paranasal sinuses. No orbital mass. Left cataract surgery Other: None IMPRESSION: Atrophy and chronic ischemic changes as above. Chronic hemorrhagic infarction left lateral basal ganglia. No acute abnormality. Electronically Signed   By: Franchot Gallo M.D.   On: 10/24/2018 16:52   US Carotid Bilateral (at [facility] Only)  Result Date: 10/18/2018 CLINICAL DATA:  Altered mental status, ataxic, hyperlipidemia EXAM: BILATERAL CAROTID DUPLEX ULTRASOUND TECHNIQUE: Pearline Cables scale imaging, color Doppler and duplex ultrasound were performed of bilateral carotid and vertebral arteries in the neck. COMPARISON:  09/28/2018 head CT FINDINGS: Criteria: Quantification of carotid stenosis is based on velocity parameters that correlate the residual internal carotid diameter with NASCET-based stenosis levels, using the diameter of the distal internal carotid lumen as the denominator for stenosis measurement. The following velocity measurements were obtained: RIGHT ICA: 71/18 cm/sec CCA: 16/10 cm/sec SYSTOLIC ICA/CCA RATIO:  0.9  ECA: 127 cm/sec LEFT ICA: 105/24 cm/sec CCA: 960/45 cm/sec SYSTOLIC ICA/CCA RATIO:  0.8 ECA: 160 cm/sec RIGHT CAROTID ARTERY: Moderate intimal thickening and diffuse carotid atherosclerosis. No hemodynamically significant right ICA stenosis, velocity elevation, or turbulent flow. Degree of narrowing less than 50%. RIGHT VERTEBRAL ARTERY:  Antegrade LEFT CAROTID ARTERY: Similar scattered moderate intimal thickening and diffuse carotid atherosclerosis. No hemodynamically significant left ICA stenosis, velocity elevation, or turbulent flow. LEFT VERTEBRAL ARTERY:  Antegrade IMPRESSION: Moderate carotid atherosclerosis. No hemodynamically significant ICA stenosis. Degree of narrowing less than 50% bilaterally by ultrasound criteria. Patent antegrade vertebral flow bilaterally Electronically Signed   By: Jerilynn Mages.  Shick M.D.   On: 10/09/2018 16:17   Dg Chest Port 1 View  Result Date: 09/30/2018 CLINICAL DATA:  Chest pain. EXAM: PORTABLE CHEST 1 VIEW COMPARISON:  Frontal and lateral views 09/14/2018 FINDINGS: Post median sternotomy and CABG. Mild cardiomegaly. Diffuse interstitial opacities most prominent in the perihilar and suprahilar regions. No confluent airspace disease. No evidence of pleural effusion or pneumothorax. No acute osseous abnormalities IMPRESSION: Mild cardiomegaly, new from exam last month. New diffuse interstitial opacities most prominent in the perihilar and suprahilar regions, favor pulmonary edema over atypical infection. Electronically Signed   By: Keith Rake M.D.   On: 09/29/2018 22:07   Ct Head Code Stroke Wo Contrast  Result Date: 10/05/2018 CLINICAL DATA:  Code stroke.  Ataxia.  Syncope. EXAM: CT HEAD WITHOUT CONTRAST TECHNIQUE: Contiguous axial images were obtained from the base of the skull through the vertex without intravenous contrast. COMPARISON:  10/14/2006 FINDINGS: Brain: There is no evidence of acute infarct, intracranial hemorrhage, mass, midline shift, or extra-axial fluid  collection. Mildly prominent, symmetric extra-axial CSF spaces over both frontal convexities are favored to be secondary to progressive cerebral atrophy rather than reflecting subdural hygromas. A chronic infarct is again noted involving the left basal ganglia and external capsule. Scattered hypodensities elsewhere in the cerebral white matter bilaterally have mildly progressed from the prior study and are nonspecific but compatible with mild chronic small vessel ischemic disease. Vascular: Calcified atherosclerosis at the skull base. No hyperdense vessel. Skull: No fracture or focal osseous lesion. Sinuses/Orbits: Near complete opacification of the right sphenoid sinus, likely by combination of fluid and mucosal thickening and with sclerosis and thickening of the sinus walls indicating an element of chronicity. Moderate bilateral ethmoid air cell mucosal thickening. Small volume fluid in the frontal sinuses. Clear mastoid air cells. Left cataract extraction. Other: None. ASPECTS [facility] Stroke Program Early CT Score) Not scored with this history. IMPRESSION: 1. No evidence of acute intracranial abnormality. 2.  Chronic left basal ganglia/external capsule infarct. 3. Mildly progressive cerebral atrophy and chronic small vessel ischemic disease. These results were called by telephone at the time of interpretation on 10/23/2018 at 1:33 pm to Dr. Tomi Bamberger, who verbally acknowledged these results. Electronically Signed   By: Logan Bores M.D.   On: 09/29/2018 13:33    Assessment and Plan:   [name] is an 85 year old gentleman with extensive native CAD s/p CABG x 2 with only LIMA -> LAD and SVG -> Diag patent, CKD, HTN, HLD who presented to OSH earlier today after an episode of altered mental status and hypotension, subsequently suffering a VT/VF arrest with post-ROSC EKG demonstrative of anterior ST elevations.   Overall, I have to suspect that his episode of AMS that occurred earlier today may have been in the  context of a ventricular arrhythmia based upon the events of later this evening. Particularly given that all neuro imaging has been unrevealing with regards to new or acute intracranial abnormality.   He is now s/p coronary angiography which demonstrated a 90% lesion in the mLAD distal to the LIMA anastomosis for which a DES x 1 was placed.   #Anterior STEMI c/b VT/VF arrest #CAD s/p CABG x 2, PCI #Ischemic Cardiomyopathy, EF previously 40% -- ASA 81mg  daily, Plavix 75 mg daily -- Bival gtt x 2 hours.  -- TTE in the AM.  -- High dose statin (atorvastatin 40mg  BID - home dosing). -- Will check lipid panel and hemoglobin a1c for risk stratification.  -- Continue amiodarone gtt -> plan load to 10g given AF/VT.  -- BID BMP - keep K > 4, Mg > 2 -- TSH within normal limits -- Suspect his chronic cough may be related to ongoing pulmonary edema. Would continue to gently diurese (s/p 20mg  IV lasix x 1 in the cath lab).   #Altered Mental Status, Resolved -- MRI brain without acute abnormality -- Carotid ultrasound without flow limiting stenosis -- CT head negative -- Suspect was in the context arrhythmia as mentioned above.  -- Can consider EEG if neurology feels it is warranted, but suspect alternative explanation.   #Atrial Fibrillation, New Onset -- Restart heparin gtt once off bival and 6-8 hours s/p sheath pull.  -- Would recommend transition back to plavix in order to continue therapeutic anticoagulation (CHADS-2-VASC = 8). Would determine time frame for d/c of ASA to avoid prolonged triple therapy.   -- PPI while on triple therapy.   #HTN -- Hold home amlodipine, HCTZ, metoprolol, hydralazine for now.    #DM2 -- Hold home metformin -- ISS PRN for hyperglycemia.   Severity of Illness: The appropriate patient status for this patient is INPATIENT. Inpatient status is judged to be reasonable and necessary in order to provide the required intensity of service to ensure the patient's  safety. The patient's presenting symptoms, physical exam findings, and initial radiographic and laboratory data in the context of their chronic comorbidities is felt to place them at high risk for further clinical deterioration. Furthermore, it is not anticipated that the patient will be medically stable for discharge from the hospital within 2 midnights of admission. The following factors support the patient status of inpatient.   " The patient's presenting symptoms include STEMI. " The worrisome physical exam findings include STEMI " The initial radiographic and laboratory data are worrisome because of STEMI " The chronic co-morbidities include HTN, CKD, HLD, Carotid disease   * I certify that at the point of admission it  is my clinical judgment that the patient will require inpatient hospital care spanning beyond 2 midnights from the point of admission due to high intensity of service, high risk for further deterioration and high frequency of surveillance required.*    For questions or updates, please contact [place] Please consult [email or web address] for contact info under   Signed, Milus Banister, MD  10/03/2018 11:48 PM

## 2018-10-25 NOTE — ED Notes (Signed)
Pt had an episode of incontinence upon arrival to ED; pt cleaned and new linen changed and pt placed in a gown

## 2018-10-25 NOTE — ED Notes (Signed)
Pt gone to [place] and MRI, called to inform Izora Gala, 300 RN of pt delay

## 2018-10-25 NOTE — Progress Notes (Signed)
ANTICOAGULATION CONSULT NOTE - Initial Consult  Pharmacy Consult for Heparin Indication: atrial fibrillation  No Known Allergies  Patient Measurements: Height: 6\' 1"  (185.4 cm) Weight: 171 lb 11.8 oz (77.9 kg) IBW/kg (Calculated) : 79.9 HEPARIN DW (KG): 77.9  Vital Signs: BP: 109/69 (01/29 1345) Pulse Rate: 85 (01/29 1345)  Labs: Recent Labs    10/23/2018 1314  HGB 11.3*  HCT 36.4*  PLT 214  APTT 23*  LABPROT 13.9  INR 1.08  CREATININE 1.58*  TROPONINI 0.07*    Estimated Creatinine Clearance: 38.3 mL/min (A) (by C-G formula based on SCr of 1.58 mg/dL (H)).   Medical History: Past Medical History:  Diagnosis Date  . CAD (coronary artery disease)    a. s/p  MI in 1988 and CABG 1989;  b. s/p redo CABG 2007;  c. s/p PCI DES to VG->PDA and native Diag;  d. 2010 Cath: occluded VG's to RI and PDA, patent LIMA->LAD, patent stent in diag->med Rx.  . Carotid arterial disease ([place])    a. s/p bilat patch angioplasties;  b. 10/7033 RICA 0-09%, LICA 38-18%  . Chronic systolic CHF (congestive heart failure) ([place])    a. 03/2008 EF 30% noted on Myoview.  . CKD (chronic kidney disease), stage III ([place])   . Gout   . History of TIAs 2000  . HTN (hypertension)   . Hypercholesteremia   . Ischemic cardiomyopathy    a. 03/2008 EF 30% noted on Myoview.  . Prostate cancer ([place])   . Rheumatoid arthritis ([place])     Medications:  See med rec  Assessment: Patient brought in by EMS due to loss of consciousness.   In addition, MD performed assessment on this patient for suspected stroke which neurology team assessed and no not think this was a focal stroke. (likely d/t tachydysrhythmia that led to change in mental status) Pt also has atrial fib, elevated troponin and pharmacy asked to start heparin. With questionable stroke, will not give an initial heparin bolus.  Goal of Therapy:  Heparin level 0.3-0.7 units/ml Monitor platelets by anticoagulation protocol: Yes   Plan:  Start heparin  infusion at 1000 units/hr Check anti-Xa level in 6-8 hours and daily while on heparin Continue to monitor H&H and platelets  Isac Sarna, BS Vena [place], BCPS Clinical Pharmacist Pager [phone] 10/14/2018,2:31 PM

## 2018-10-25 NOTE — Consult Note (Signed)
[place] A. Merlene Laughter, MD     [email or web address]          [name] [name] is an 85 y.o. male.   ASSESSMENT/PLAN: 1.  Episode of unresponsiveness: Etiology unclear but given the new onset atrial fibrillation I suspect this is the most likely etiology.  The patient does have a remote infarct on imaging which raises the possibility of seizure although the infarct involves the basal ganglia. 2.  Bradycardia: Unclear this could be causative to the patient's episode but warrants further discussion. 3.  New onset atrial fibrillation 4.  Remote left basal ganglia infarct with hemorrhagic changes; exact timing of this event is unclear with a suspected occurred during 1 of the patient's coronary bypass procedures. 5.  Diabetic polyneuropathy     RECOMMENDATION: 1.  EEG 2.  Long-term anticoagulation; discontinue Plavix is okay with cardiology 3.  I will check orthostatics to evaluate for possible diabetic induced dysautonomia.    The patient is a 85 year old white male who presents with episode of unresponsiveness.  The patient reports that he had not been feeling well the day before.  He had a couple spells where his vision went pink and he had a sensation as if his heart rate was too low.  Both events lasted for about 5 minutes or so.  With his current event, the patient reports that he again felt as if his heart rate was low and he passed out.  He was found by his wife to be unresponsive.  Pulse rate was present when EMS arrived but his blood pressure apparently was not obtainable.  There are no reports of hypoglycemia.  He does have a history of diabetes mellitus.  No reports of convulsion or oral trauma.  He was incontinence of bladder and bowel.  The patient tells me that he has had a TIA in the past.  This happened several years ago in the 1990s when he developed transient periorbital numbness on the right side along with numbness of the right upper extremity.  This  happened about 2 times within a week or so.  He did go to's get testing and was told that imaging was negative and that he had a TIA.  The patient has had chronic cough for the last 3 months.  Has had multiple x-rays done.  He was given a strong cough medication for this.  He took it on this past Sunday but developed significant cognitive impairment and therefore he did not take another tablet.  The symptoms persisted until Monday.  The patient is very active.  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  Patient has episodic tinnitus.  The review of systems otherwise negative.    GENERAL: This a pleasant male in no acute distress.  HEENT: Neck is supple no trauma appreciated  ABDOMEN: soft  EXTREMITIES: No edema   BACK: Normal  SKIN: Normal by inspection.    MENTAL STATUS: Alert and oriented. Speech, language and cognition are generally intact. Judgment and insight normal.   CRANIAL NERVES: Pupils are equal, round and reactive to light and accomodation; extra ocular movements are full, there is no significant nystagmus; visual fields are full; upper and lower facial muscles are normal in strength and symmetric, there is no flattening of the nasolabial folds; tongue is midline; uvula is midline; shoulder elevation is normal.  MOTOR: Normal tone, bulk and strength; no pronator drift.  COORDINATION: Left finger to nose is normal, right  finger to nose is normal, No rest tremor; no intention tremor; no postural tremor; no bradykinesia.  REFLEXES: Deep tendon reflexes are symmetrical and normal in the upper extremities but markedly diminished in the legs. Plantar reflexes are flexor bilaterally.   SENSATION: Normal to light touch, temperature, and pain.    Blood pressure (!) 163/85, pulse (!) 49, temperature 98 F (36.7 C), temperature source Oral, resp. rate 20, height 6\' 1"  (1.854 m), weight 77.9 kg, SpO2 98 %.  Past Medical History:  Diagnosis Date  . CAD  (coronary artery disease)    a. s/p  MI in 1988 and CABG 1989;  b. s/p redo CABG 2007;  c. s/p PCI DES to VG->PDA and native Diag;  d. 2010 Cath: occluded VG's to RI and PDA, patent LIMA->LAD, patent stent in diag->med Rx.  . Carotid arterial disease ([place])    a. s/p bilat patch angioplasties;  b. 10/4266 RICA 3-41%, LICA 96-22%  . Chronic systolic CHF (congestive heart failure) ([place])    a. 03/2008 EF 30% noted on Myoview.  . CKD (chronic kidney disease), stage III ([place])   . Gout   . History of TIAs 2000  . HTN (hypertension)   . Hypercholesteremia   . Ischemic cardiomyopathy    a. 03/2008 EF 30% noted on Myoview.  . Prostate cancer ([place])   . Rheumatoid arthritis [facility])     Past Surgical History:  Procedure Laterality Date  . BACK SURGERY    . CARDIOVASCULAR STRESS TEST  04-02-08   EF 30%  . CAROTID ENDARTERECTOMY  1992, 2007   LEFT, RIGHT  . CHOLECYSTECTOMY N/A 06/22/2013   Procedure: LAPAROSCOPIC CHOLECYSTECTOMY;  Surgeon: Gayland Curry, MD;  Location: [facility];  Service: General;  Laterality: N/A;  . CORONARY ARTERY BYPASS GRAFT  [phone]  . KNEE ARTHROSCOPY     RIGHT KNEE  . RADIOACTIVE SEED IMPLANT  2005    Family History  Problem Relation Age of Onset  . Other Father 11       CEREBRAL HEMORRHAGE  . Cancer Mother 68    Social History:  reports that he quit smoking about 32 years ago. His smoking use included cigarettes. He has never used smokeless tobacco. He reports that he does not drink alcohol or use drugs.  Allergies: No Known Allergies  Medications: Prior to Admission medications   Medication Sig Start Date End Date Taking? Authorizing Provider  allopurinol (ZYLOPRIM) 300 MG tablet TAKE 1 TABLET BY MOUTH EVERY DAY 10/31/15  Yes [place], Peter M, MD  amLODipine (NORVASC) 10 MG tablet Take 10 mg by mouth daily.     Yes [provider]  aspirin 81 MG tablet Take 81 mg by mouth daily.     Yes [provider]  atorvastatin (LIPITOR) 40 MG tablet Take 40 mg  by mouth 2 (two) times daily.    Yes [provider]  clopidogrel (PLAVIX) 75 MG tablet TAKE 1 TABLET BY MOUTH EVERY DAY 06/23/18  Yes [place]ique, Peter M, MD  folic acid (FOLVITE) 0.5 MG tablet Take 0.5 mg by mouth daily.   Yes [provider]  hydrALAZINE (APRESOLINE) 25 MG tablet TAKE 1 TABLET BY MOUTH THREE TIMES A DAY 06/22/18  Yes [place], Peter M, MD  hydrochlorothiazide (MICROZIDE) 12.5 MG capsule Take 12.5 mg by mouth daily.   Yes [provider]  isosorbide mononitrate (IMDUR) 30 MG 24 hr tablet TAKE 1 TABLET BY MOUTH TWICE A DAY 08/14/18  Yes Martinique, Peter M, MD  ketoconazole (  NIZORAL) 2 % shampoo See admin instructions. 04/24/18  Yes [provider]  leflunomide (ARAVA) 10 MG tablet Take 10 mg by mouth daily. 03/29/18  Yes [provider]  metFORMIN (GLUCOPHAGE-XR) 500 MG 24 hr tablet TAKE 1 TABLET BY MOUTH EVERY DAY WITH EVENING MEAL 03/09/18  Yes [provider]  metoprolol tartrate (LOPRESSOR) 25 MG tablet Take 12.5 mg by mouth 2 (two) times daily.     Yes [provider]  azithromycin (ZITHROMAX) 250 MG tablet Take 1 tablet by mouth 2 (two) times daily. 08/25/18   [provider]  desonide (DESOWEN) 0.05 % lotion Apply 1 application topically as needed.  08/13/18   [provider]  ergocalciferol (VITAMIN D2) 50000 UNITS capsule Take 50,000 Units by mouth every 30 (thirty) days. Twice monthly 1st and 15th    [provider]  methotrexate (RHEUMATREX) 2.5 MG tablet Take 4 tablets by mouth once a week. On mondays 08/18/18   [provider]  nitroGLYCERIN (NITROSTAT) 0.4 MG SL tablet Place 1 tablet (0.4 mg total) under the tongue every 5 (five) minutes as needed. 06/25/16   [place], Peter M, MD    Scheduled Meds: Derrill Memo ON 10/26/2018] allopurinol  300 mg Oral Daily  . aspirin  81 mg Oral Daily  . atorvastatin  40 mg Oral BID  . [START ON 10/26/2018] clopidogrel  75 mg Oral Daily  . [START ON  8/93/8101] folic acid  0.5 mg Oral Daily  . [START ON 10/26/2018] leflunomide  10 mg Oral Daily  . [START ON 10/30/2018] methotrexate  10 mg Oral Weekly  . metoprolol tartrate  25 mg Oral BID   Continuous Infusions: . sodium chloride 75 mL/hr at 09/27/2018 1837  . heparin 1,000 Units/hr (10/10/2018 1501)   PRN Meds:.acetaminophen **OR** acetaminophen (TYLENOL) oral liquid 160 mg/5 mL **OR** acetaminophen, hydrALAZINE     Results for orders placed or performed during the hospital encounter of 09/28/2018 (from the past 48 hour(s))  Protime-INR     Status: None   Collection Time: 09/30/2018  1:14 PM  Result Value Ref Range   Prothrombin Time 13.9 11.4 - 15.2 seconds   INR 1.08     Comment: Performed at [facility], [address] [place], [place] [address]  APTT     Status: Abnormal   Collection Time: 09/27/2018  1:14 PM  Result Value Ref Range   aPTT 23 (L) 24 - 36 seconds    Comment: Performed at [facility], [address], [place], [place] [address]  CBC     Status: Abnormal   Collection Time: 10/18/2018  1:14 PM  Result Value Ref Range   WBC 6.6 4.0 - 10.5 K/uL   RBC 4.12 (L) 4.22 - 5.81 MIL/uL   Hemoglobin 11.3 (L) 13.0 - 17.0 g/dL   HCT 36.4 (L) 39.0 - 52.0 %   MCV 88.3 80.0 - 100.0 fL   MCH 27.4 26.0 - 34.0 pg   MCHC 31.0 30.0 - 36.0 g/dL   RDW 16.1 (H) 11.5 - 15.5 %   Platelets 214 150 - 400 K/uL   nRBC 0.0 0.0 - 0.2 %    Comment: Performed at [facility], [address] [place], [place] [address]  Differential     Status: None   Collection Time: 10/22/2018  1:14 PM  Result Value Ref Range   Neutrophils Relative % 76 %   Neutro Abs 5.0 1.7 - 7.7 K/uL   Lymphocytes Relative 18 %   Lymphs Abs  1.2 0.7 - 4.0 K/uL   Monocytes Relative 3 %   Monocytes Absolute 0.2 0.1 - 1.0 K/uL   Eosinophils Relative 2 %   Eosinophils Absolute 0.1 0.0 - 0.5 K/uL   Basophils Relative 1 %   Basophils Absolute 0.0 0.0 - 0.1 K/uL   Immature Granulocytes 0 %   Abs Immature Granulocytes 0.02 0.00 -  0.07 K/uL    Comment: Performed at [facility], [address] [place], [place] [address]  Comprehensive metabolic panel     Status: Abnormal   Collection Time: 10/20/2018  1:14 PM  Result Value Ref Range   Sodium 136 135 - 145 mmol/L   Potassium 3.1 (L) 3.5 - 5.1 mmol/L   Chloride 102 98 - 111 mmol/L   CO2 22 22 - 32 mmol/L   Glucose, Bld 208 (H) 70 - 99 mg/dL   BUN 27 (H) 8 - 23 mg/dL   Creatinine, Ser 1.58 (H) 0.61 - 1.24 mg/dL   Calcium 8.3 (L) 8.9 - 10.3 mg/dL   Total Protein 6.0 (L) 6.5 - 8.1 g/dL   Albumin 3.0 (L) 3.5 - 5.0 g/dL   AST 34 15 - 41 U/L   ALT 16 0 - 44 U/L   Alkaline Phosphatase 83 38 - 126 U/L   Total Bilirubin 1.0 0.3 - 1.2 mg/dL   GFR calc non Af Amer 40 (L) >60 mL/min   GFR calc Af Amer 46 (L) >60 mL/min   Anion gap 12 5 - 15    Comment: Performed at [facility], [address] [place], [place] [address]  Troponin I - ONCE - STAT     Status: Abnormal   Collection Time: 10/14/2018  1:14 PM  Result Value Ref Range   Troponin I 0.07 (HH) <0.03 ng/mL    Comment: CRITICAL RESULT CALLED TO, READ BACK BY AND VERIFIED WITH: MINTER,R AT 1353 ON 1.29.20 BY ISLEY,B Performed at [facility], [address] [place], [place] [address]   Magnesium     Status: None   Collection Time: 10/27/2018  1:14 PM  Result Value Ref Range   Magnesium 2.2 1.7 - 2.4 mg/dL    Comment: Performed at [facility], [address] [place], [place] [address]  I-stat troponin, ED     Status: None   Collection Time: 10/08/2018  1:16 PM  Result Value Ref Range   Troponin i, poc 0.04 0.00 - 0.08 ng/mL   Comment 3            Comment: Due to the release kinetics of cTnI, a negative result within the first hours of the onset of symptoms does not rule out myocardial infarction with certainty. If myocardial infarction is still suspected, repeat the test at appropriate intervals.   TSH     Status: None   Collection Time: 10/12/2018  3:39 PM  Result Value Ref Range   TSH 2.349 0.350 - 4.500  uIU/mL    Comment: Performed by a 3rd Generation assay with a functional sensitivity of <=0.01 uIU/mL. Performed at [facility], [address], [place], [place] [address]   Troponin I - Now Then Q6H     Status: Abnormal   Collection Time: 09/30/2018  3:39 PM  Result Value Ref Range   Troponin I 0.19 (HH) <0.03 ng/mL    Comment: CRITICAL VALUE NOTED.  VALUE IS CONSISTENT WITH PREVIOUSLY REPORTED AND CALLED VALUE. Performed at [facility], [address], [place],  [address]     Studies/Results:  Brain MRI is reviewed in person.  There is mild to periventricular white matter hyperintensity consistent with chronic microvascular changes.  There is a area of large encephalomalacia involving the lentiform nucleus on the left side.  This is associated with increased signal on flair.  There is also reduced signal noted on SWI indicating chronic hemorrhage.  The findings suggest that the encephalomalacia may have been due to putaminal hemorrhage in the past.    Shristi Scheib A. Merlene Laughter, M.D.  Diplomate, Tax adviser of Psychiatry and Neurology ( Neurology). 10/02/2018, 7:09 PM

## 2018-10-25 NOTE — Progress Notes (Signed)
[name] [name] GYK:[id number] DOB: Apr 23, 1932 DOA: 10/07/2018   Night shift hospitalist telemetry coverage note.   The patient was seen after CODE BLUE was initiated.  The patient was transiently on V. tach, followed by V. fib, which responded quickly to about a minute of CPR and cardioversion.  Upon ROSC  the patient was alert x3.  He was transferred to the ICU, analgesics for chest pain were given.  Initial EKG after event, which showed undetermined rhythm, not in system.  Heparin was continued.  Magnesium sulfate 2 g IVPB x1 given and amiodarone bolus plus infusion started.  Follow-up troponin was elevated at 0.94 ng/mL.  Cardiology was contacted.  Repeat EKG showed antero-lateral leads STEMI.  Cardiology was re-paged and code STEMI was called.  Local EMS was notified so the patient can be transported to the Cath Lab.  Past Medical History:  Diagnosis Date  . CAD (coronary artery disease)    a. s/p  MI in 1988 and CABG 1989;  b. s/p redo CABG 2007;  c. s/p PCI DES to VG->PDA and native Diag;  d. 2010 Cath: occluded VG's to RI and PDA, patent LIMA->LAD, patent stent in diag->med Rx.  . Carotid arterial disease ([place])    a. s/p bilat patch angioplasties;  b. 03/9389 RICA 3-00%, LICA 92-33%  . Chronic systolic CHF (congestive heart failure) ([place])    a. 03/2008 EF 30% noted on Myoview.  . CKD (chronic kidney disease), stage III ([place])   . Gout   . History of TIAs 2000  . HTN (hypertension)   . Hypercholesteremia   . Ischemic cardiomyopathy    a. 03/2008 EF 30% noted on Myoview.  . Prostate cancer ([place])   . Rheumatoid arthritis [facility])     Past Surgical History:  Procedure Laterality Date  . BACK SURGERY    . CARDIOVASCULAR STRESS TEST  04-02-08   EF 30%  . CAROTID ENDARTERECTOMY  1992, 2007   LEFT, RIGHT  . CHOLECYSTECTOMY N/A 06/22/2013   Procedure: LAPAROSCOPIC CHOLECYSTECTOMY;  Surgeon: Gayland Curry, MD;  Location: [facility];  Service: General;  Laterality: N/A;  . CORONARY ARTERY BYPASS  GRAFT  [phone]  . KNEE ARTHROSCOPY     RIGHT KNEE  . RADIOACTIVE SEED IMPLANT  2005     reports that he quit smoking about 32 years ago. His smoking use included cigarettes. He has never used smokeless tobacco. He reports that he does not drink alcohol or use drugs.  No Known Allergies  Family History  Problem Relation Age of Onset  . Other Father 38       CEREBRAL HEMORRHAGE  . Cancer Mother 15   Prior to Admission medications   Medication Sig Start Date End Date Taking? Authorizing Provider  allopurinol (ZYLOPRIM) 300 MG tablet TAKE 1 TABLET BY MOUTH EVERY DAY 10/31/15  Yes [place], Peter M, MD  amLODipine (NORVASC) 10 MG tablet Take 10 mg by mouth daily.     Yes [provider]  aspirin 81 MG tablet Take 81 mg by mouth daily.     Yes [provider]  atorvastatin (LIPITOR) 40 MG tablet Take 40 mg by mouth 2 (two) times daily.    Yes [provider]  clopidogrel (PLAVIX) 75 MG tablet TAKE 1 TABLET BY MOUTH EVERY DAY 06/23/18  Yes Martinique, Peter M, MD  folic acid (FOLVITE) 0.5 MG tablet Take 0.5 mg by mouth daily.   Yes [provider]  hydrALAZINE (APRESOLINE) 25 MG tablet TAKE 1  TABLET BY MOUTH THREE TIMES A DAY 06/22/18  Yes [place], Peter M, MD  hydrochlorothiazide (MICROZIDE) 12.5 MG capsule Take 12.5 mg by mouth daily.   Yes [provider]  isosorbide mononitrate (IMDUR) 30 MG 24 hr tablet TAKE 1 TABLET BY MOUTH TWICE A DAY 08/14/18  Yes Martinique, Peter M, MD  ketoconazole (NIZORAL) 2 % shampoo See admin instructions. 04/24/18  Yes [provider]  leflunomide (ARAVA) 10 MG tablet Take 10 mg by mouth daily. 03/29/18  Yes [provider]  metFORMIN (GLUCOPHAGE-XR) 500 MG 24 hr tablet TAKE 1 TABLET BY MOUTH EVERY DAY WITH EVENING MEAL 03/09/18  Yes [provider]  metoprolol tartrate (LOPRESSOR) 25 MG tablet Take 12.5 mg by mouth 2 (two) times daily.     Yes [provider]  azithromycin (ZITHROMAX) 250 MG  tablet Take 1 tablet by mouth 2 (two) times daily. 08/25/18   [provider]  desonide (DESOWEN) 0.05 % lotion Apply 1 application topically as needed.  08/13/18   [provider]  ergocalciferol (VITAMIN D2) 50000 UNITS capsule Take 50,000 Units by mouth every 30 (thirty) days. Twice monthly 1st and 15th    [provider]  methotrexate (RHEUMATREX) 2.5 MG tablet Take 4 tablets by mouth once a week. On mondays 08/18/18   [provider]  nitroGLYCERIN (NITROSTAT) 0.4 MG SL tablet Place 1 tablet (0.4 mg total) under the tongue every 5 (five) minutes as needed. 06/25/16   Martin[place], Peter M, MD    Physical Exam: Vitals:   10/27/2018 2245 10/19/2018 2300 10/17/2018 2315 10/14/2018 2330  BP: 116/69 118/74 112/72 132/76  Pulse: (!) 36 (!) 43 (!) 101 (!) 54  Resp: (!) 31 (!) 25 15 (!) 29  Temp:      TempSrc:      SpO2: 97% 100% 99% 99%  Weight:      Height:        Constitutional: NAD, calm, comfortable Eyes: PERRL, lids and conjunctivae normal ENMT: Mucous membranes are moist. Posterior pharynx clear of any exudate or lesions. Neck: normal, supple, no masses, no thyromegaly Respiratory: Mild rhonchi on upper lung fields, particularly on right side.  No wheezing, no crackles. Normal respiratory effort. No accessory muscle use.  Cardiovascular: Irregularly irregular, no murmurs / rubs / gallops. No extremity edema. 2+ pedal pulses. No carotid bruits.  Abdomen: Soft, no tenderness, no masses palpated. No hepatosplenomegaly. Bowel sounds positive.  Musculoskeletal: no clubbing / cyanosis. Good ROM, no contractures. Normal muscle tone.  Skin: no rashes, lesions, ulcers. No induration on very limited dermatological examination. Neurologic: CN 2-12 grossly intact. Sensation intact, DTR normal. Strength 5/5 in all 4.  Psychiatric: Normal judgment and insight. Alert and oriented x 4. Normal mood.    CBC: Recent Labs  Lab 10/05/2018 1314 10/27/2018 2126  WBC 6.6 7.0   NEUTROABS 5.0 4.2  HGB 11.3* 10.9*  HCT 36.4* 35.5*  MCV 88.3 89.6  PLT 214 938   Basic Metabolic Panel: Recent Labs  Lab 10/02/2018 1314 10/06/2018 2126  NA 136 134*  K 3.1* 3.1*  CL 102 100  CO2 22 21*  GLUCOSE 208* 189*  BUN 27* 24*  CREATININE 1.58* 1.55*  CALCIUM 8.3* 8.2*  MG 2.2 2.2   GFR: Estimated Creatinine Clearance: 39.1 mL/min (A) (by C-G formula based on SCr of 1.55 mg/dL (H)). Liver Function Tests: Recent Labs  Lab 10/10/2018 1314 10/19/2018 2126  AST 34 95*  ALT 16 63*  ALKPHOS 83 81  BILITOT 1.0 0.6  PROT 6.0* 5.8*  ALBUMIN 3.0* 2.9*   No results for input(s): LIPASE, AMYLASE in the last 168 hours. No results for input(s): AMMONIA in the last 168 hours. Coagulation Profile: Recent Labs  Lab 10/14/2018 1314  INR 1.08   Cardiac Enzymes: Recent Labs  Lab 10/02/2018 1314 09/28/2018 1539 10/08/2018 2126  TROPONINI 0.07* 0.19* 0.94*   BNP (last 3 results) No results for input(s): PROBNP in the last 8760 hours. HbA1C: No results for input(s): HGBA1C in the last 72 hours. CBG: No results for input(s): GLUCAP in the last 168 hours. Lipid Profile: No results for input(s): CHOL, HDL, LDLCALC, TRIG, CHOLHDL, LDLDIRECT in the last 72 hours. Thyroid Function Tests: Recent Labs    10/23/2018 1539  TSH 2.349   Anemia Panel: No results for input(s): VITAMINB12, FOLATE, FERRITIN, TIBC, IRON, RETICCTPCT in the last 72 hours. Urine analysis:    Component Value Date/Time   COLORURINE YELLOW 09/09/2016 1843   APPEARANCEUR CLEAR 09/09/2016 1843   LABSPEC 1.023 09/09/2016 1843   PHURINE 5.0 09/09/2016 1843   GLUCOSEU NEGATIVE 09/09/2016 1843   HGBUR NEGATIVE 09/09/2016 1843   BILIRUBINUR NEGATIVE 09/09/2016 1843   KETONESUR NEGATIVE 09/09/2016 1843   PROTEINUR 100 (A) 09/09/2016 1843   UROBILINOGEN 0.2 06/20/2013 0659   NITRITE NEGATIVE 09/09/2016 1843   LEUKOCYTESUR NEGATIVE 09/09/2016 1843    Radiological Exams on Admission: Mr Brain Wo  Contrast  Result Date: 10/03/2018 CLINICAL DATA:  Altered level of consciousness. Prostate cancer history. EXAM: MRI HEAD WITHOUT CONTRAST TECHNIQUE: Multiplanar, multiecho pulse sequences of the brain and surrounding structures were obtained without intravenous contrast. COMPARISON:  CT head 10/15/2018 FINDINGS: Brain: Moderate atrophy. Chronic hemorrhagic infarction left basal ganglia/external capsule. Mild chronic microvascular ischemic change elsewhere in the white matter. Negative for acute infarct. Negative for mass or edema. No midline shift. Vascular: Normal arterial flow void Skull and upper cervical spine: Negative Sinuses/Orbits: Moderate mucosal edema throughout the paranasal sinuses. No orbital mass. Left cataract surgery Other: None IMPRESSION: Atrophy and chronic ischemic changes as above. Chronic hemorrhagic infarction left lateral basal ganglia. No acute abnormality. Electronically Signed   By: Franchot Gallo M.D.   On: 10/08/2018 16:52   US Carotid Bilateral (at [facility] And [facility] Only)  Result Date: 10/24/2018 CLINICAL DATA:  Altered mental status, ataxic, hyperlipidemia EXAM: BILATERAL CAROTID DUPLEX ULTRASOUND TECHNIQUE: Pearline Cables scale imaging, color Doppler and duplex ultrasound were performed of bilateral carotid and vertebral arteries in the neck. COMPARISON:  10/02/2018 head CT FINDINGS: Criteria: Quantification of carotid stenosis is based on velocity parameters that correlate the residual internal carotid diameter with NASCET-based stenosis levels, using the diameter of the distal internal carotid lumen as the denominator for stenosis measurement. The following velocity measurements were obtained: RIGHT ICA: 71/18 cm/sec CCA: 83/15 cm/sec SYSTOLIC ICA/CCA RATIO:  0.9 ECA: 127 cm/sec LEFT ICA: 105/24 cm/sec CCA: 176/16 cm/sec SYSTOLIC ICA/CCA RATIO:  0.8 ECA: 160 cm/sec RIGHT CAROTID ARTERY: Moderate intimal thickening and diffuse carotid atherosclerosis. No hemodynamically significant right ICA  stenosis, velocity elevation, or turbulent flow. Degree of narrowing less than 50%. RIGHT VERTEBRAL ARTERY:  Antegrade LEFT CAROTID ARTERY: Similar scattered moderate intimal thickening and diffuse carotid atherosclerosis. No hemodynamically significant left ICA stenosis, velocity elevation, or turbulent flow. LEFT VERTEBRAL ARTERY:  Antegrade IMPRESSION: Moderate carotid atherosclerosis. No hemodynamically significant ICA stenosis. Degree of narrowing less than 50% bilaterally by ultrasound criteria. Patent antegrade vertebral flow bilaterally Electronically Signed   By: Jerilynn Mages.  Shick M.D.   On: 10/12/2018 16:17   Dg Chest  Port 1 View  Result Date: 10/16/2018 CLINICAL DATA:  Chest pain. EXAM: PORTABLE CHEST 1 VIEW COMPARISON:  Frontal and lateral views 09/14/2018 FINDINGS: Post median sternotomy and CABG. Mild cardiomegaly. Diffuse interstitial opacities most prominent in the perihilar and suprahilar regions. No confluent airspace disease. No evidence of pleural effusion or pneumothorax. No acute osseous abnormalities IMPRESSION: Mild cardiomegaly, new from exam last month. New diffuse interstitial opacities most prominent in the perihilar and suprahilar regions, favor pulmonary edema over atypical infection. Electronically Signed   By: Keith Rake M.D.   On: 10/04/2018 22:07   Ct Head Code Stroke Wo Contrast  Result Date: 10/16/2018 CLINICAL DATA:  Code stroke.  Ataxia.  Syncope. EXAM: CT HEAD WITHOUT CONTRAST TECHNIQUE: Contiguous axial images were obtained from the base of the skull through the vertex without intravenous contrast. COMPARISON:  10/14/2006 FINDINGS: Brain: There is no evidence of acute infarct, intracranial hemorrhage, mass, midline shift, or extra-axial fluid collection. Mildly prominent, symmetric extra-axial CSF spaces over both frontal convexities are favored to be secondary to progressive cerebral atrophy rather than reflecting subdural hygromas. A chronic infarct is again noted  involving the left basal ganglia and external capsule. Scattered hypodensities elsewhere in the cerebral white matter bilaterally have mildly progressed from the prior study and are nonspecific but compatible with mild chronic small vessel ischemic disease. Vascular: Calcified atherosclerosis at the skull base. No hyperdense vessel. Skull: No fracture or focal osseous lesion. Sinuses/Orbits: Near complete opacification of the right sphenoid sinus, likely by combination of fluid and mucosal thickening and with sclerosis and thickening of the sinus walls indicating an element of chronicity. Moderate bilateral ethmoid air cell mucosal thickening. Small volume fluid in the frontal sinuses. Clear mastoid air cells. Left cataract extraction. Other: None. ASPECTS [facility] Stroke Program Early CT Score) Not scored with this history. IMPRESSION: 1. No evidence of acute intracranial abnormality. 2. Chronic left basal ganglia/external capsule infarct. 3. Mildly progressive cerebral atrophy and chronic small vessel ischemic disease. These results were called by telephone at the time of interpretation on 10/13/2018 at 1:33 pm to Dr. Tomi Bamberger, who verbally acknowledged these results. Electronically Signed   By: Logan Bores M.D.   On: 09/27/2018 13:33    EKG: Independently reviewed.  Vent. rate 78 BPM PR interval * ms QRS duration 130 ms QT/QTc 456/519 ms P-R-T axes 16 39 98 Undetermined rhythm Non-specific intra-ventricular conduction block ST elevation consider anterolateral injury or acute infarct ST elevation consider inferior injury or acute infarct   ACUTE MI / STEMI Consider right ventricular involvement in acute inferior infarct Abnormal ECG 25mm/s 61mm/mV 100Hz  9.0.4 12SL 241 HD CID: [id number] Referred by: [name] [name]  Assessment/Plan   S/P cardiac arrest   Vtach/Vfib   CAD (coronary artery disease)   STEMI Continue heparin infusion. Continue amiodarone infusion. Continue atorvastatin, beta-blocker, aspirin and  Plavix. Transferred to the cardiac lab after discussing the case with cardiology. I would like to thank Dr. Marletta Lor abd Dr. Claiborne Billings for their assistance and input.  Reubin Milan MD  10/26/2018, 12:25 AM   About 70 minutes of critical care time were used during this event and transferred to tertiary health care center.   This document was prepared using Dragon voice recognition software and may contain some unintended transcription errors.

## 2018-10-25 NOTE — Progress Notes (Signed)
CODE STROKE 1308 CALL TIME 1309 BEEPER TIME 1318 EXAM STARTED 1318 EXAM FINISHED 1318 IMAGES SENT TO SOC 1326 EXAM COMPLETED IN EPIC 1326 [facility] CALLED

## 2018-10-25 NOTE — ED Provider Notes (Signed)
[facility] EMERGENCY DEPARTMENT Provider Note   CSN: [id number] Arrival date & time: 10/27/2018  1242   An emergency department physician performed an initial assessment on this suspected stroke patient at 1315.  History   Chief Complaint Chief Complaint  Patient presents with  . Loss of Consciousness    HPI [name] [name] is a 85 y.o. male.  HPI Level 5 caveat due to acuity of the situation.  85 year old male with history of coronary artery disease, hypertension, hyperlipidemia, CHF with EF of 30%, TIA comes in with chief complaint of loss of consciousness.  According to the patient's wife, she went to the doctor's clinic at 730 a.m.  At that time patient was alert, she called him around 10:00 in the morning and he still sounded alert -however when she got home after 11 AM patient was unable to recognize her and he did not look well.  She immediately called EMS and when she returned patient was gasping for air and was unresponsive.  When paramedics arrived they noted that patient was unconscious.  He had pinpoint pupils and they gave 1.4 of Narcan.  Patient had a pulse with a could not get a blood pressure reading on him, en route to the ER he became more alert.  Patient was diaphoretic and has defecated and urinated on himself.  No one had seen any episode of seizure-like activity.  Patient arrives to the ER oriented to self and location.  He is unsure how he got to the hospital.  He is feeling dizzy and unwell.  Past Medical History:  Diagnosis Date  . CAD (coronary artery disease)    a. s/p  MI in 1988 and CABG 1989;  b. s/p redo CABG 2007;  c. s/p PCI DES to VG->PDA and native Diag;  d. 2010 Cath: occluded VG's to RI and PDA, patent LIMA->LAD, patent stent in diag->med Rx.  . Carotid arterial disease ([place])    a. s/p bilat patch angioplasties;  b. 02/2830 RICA 5-17%, LICA 61-60%  . Chronic systolic CHF (congestive heart failure) ([place])    a. 03/2008 EF 30% noted on Myoview.    . CKD (chronic kidney disease), stage III ([place])   . Gout   . History of TIAs 2000  . HTN (hypertension)   . Hypercholesteremia   . Ischemic cardiomyopathy    a. 03/2008 EF 30% noted on Myoview.  . Prostate cancer ([place])   . Rheumatoid arthritis [facility])     Patient Active Problem List   Diagnosis Date Noted  . Chronic pain of right knee 02/16/2017  . Unilateral primary osteoarthritis, right knee 02/16/2017  . Unspecified constipation 06/24/2013  . Acute renal failure ([place]) 06/21/2013  . Acute cholecystitis 06/20/2013  . Chronic systolic CHF (congestive heart failure) ([place]) 08/25/2012  . Carotid arterial disease ([place])   . CAD (coronary artery disease)   . HTN (hypertension)   . Hypercholesteremia   . Chronic renal insufficiency   . Left ventricular dysfunction   . Gout   . History of TIAs   . Prostate cancer [facility])     Past Surgical History:  Procedure Laterality Date  . BACK SURGERY    . CARDIOVASCULAR STRESS TEST  04-02-08   EF 30%  . CAROTID ENDARTERECTOMY  1992, 2007   LEFT, RIGHT  . CHOLECYSTECTOMY N/A 06/22/2013   Procedure: LAPAROSCOPIC CHOLECYSTECTOMY;  Surgeon: Gayland Curry, MD;  Location: [place];  Service: General;  Laterality: N/A;  . CORONARY ARTERY BYPASS GRAFT  [phone]  . KNEE ARTHROSCOPY     RIGHT KNEE  . RADIOACTIVE SEED IMPLANT  2005        Home Medications    Prior to Admission medications   Medication Sig Start Date End Date Taking? Authorizing Provider  allopurinol (ZYLOPRIM) 300 MG tablet TAKE 1 TABLET BY MOUTH EVERY DAY 10/31/15  Yes [place], Peter M, MD  amLODipine (NORVASC) 10 MG tablet Take 10 mg by mouth daily.     Yes [provider]  aspirin 81 MG tablet Take 81 mg by mouth daily.     Yes [provider]  atorvastatin (LIPITOR) 40 MG tablet Take 40 mg by mouth 2 (two) times daily.    Yes [provider]  clopidogrel (PLAVIX) 75 MG tablet TAKE 1 TABLET BY MOUTH EVERY DAY 06/23/18  Yes [place], Peter M, MD  folic acid  (FOLVITE) 0.5 MG tablet Take 0.5 mg by mouth daily.   Yes [provider]  hydrALAZINE (APRESOLINE) 25 MG tablet TAKE 1 TABLET BY MOUTH THREE TIMES A DAY 06/22/18  Yes [place], Peter M, MD  hydrochlorothiazide (MICROZIDE) 12.5 MG capsule Take 12.5 mg by mouth daily.   Yes [provider]  isosorbide mononitrate (IMDUR) 30 MG 24 hr tablet TAKE 1 TABLET BY MOUTH TWICE A DAY 08/14/18  Yes [place], Peter M, MD  leflunomide (ARAVA) 10 MG tablet Take 10 mg by mouth daily. 03/29/18  Yes [provider]  metFORMIN (GLUCOPHAGE-XR) 500 MG 24 hr tablet TAKE 1 TABLET BY MOUTH EVERY DAY WITH EVENING MEAL 03/09/18  Yes [provider]  metoprolol tartrate (LOPRESSOR) 25 MG tablet Take 12.5 mg by mouth 2 (two) times daily.     Yes [provider]  candesartan (ATACAND) 32 MG tablet Take 32 mg by mouth daily.    [provider]  ergocalciferol (VITAMIN D2) 50000 UNITS capsule Take 50,000 Units by mouth every 30 (thirty) days. Twice monthly 1st and 15th    [provider]  ketoconazole (NIZORAL) 2 % shampoo See admin instructions. 04/24/18   [provider]  nitroGLYCERIN (NITROSTAT) 0.4 MG SL tablet Place 1 tablet (0.4 mg total) under the tongue every 5 (five) minutes as needed. 06/25/16   [place]ique, Peter M, MD    Family History Family History  Problem Relation Age of Onset  . Other Father 38       CEREBRAL HEMORRHAGE  . Cancer Mother 36    Social History Social History   Tobacco Use  . Smoking status: Former Smoker    Types: Cigarettes    Last attempt to quit: 09/27/1986    Years since quitting: 32.0  . Smokeless tobacco: Never Used  Substance Use Topics  . Alcohol use: No  . Drug use: No     Allergies   Patient has no known allergies.   Review of Systems Review of Systems  Unable to perform ROS: Mental status change     Physical Exam Updated Vital Signs BP 109/69   Pulse 85   Resp (!) 24   Ht 6\' 1"  (1.854 m)   Wt 77.9  kg   BMI 22.66 kg/m   Physical Exam Vitals signs and nursing note reviewed.  Constitutional:      Appearance: He is well-developed.  HENT:     Head: Atraumatic.  Neck:     Musculoskeletal: Neck supple.  Cardiovascular:     Rate and Rhythm: Normal rate. Rhythm irregular.  Pulmonary:     Effort: Pulmonary effort is  normal.  Abdominal:     Tenderness: There is no abdominal tenderness.  Skin:    General: Skin is warm.  Neurological:     Mental Status: He is alert.     Cranial Nerves: No cranial nerve deficit.     Sensory: No sensory deficit.     Motor: No weakness.     Coordination: Coordination normal.     Gait: Gait normal.     Deep Tendon Reflexes: Reflexes normal.     Comments: Oriented to self and location      ED Treatments / Results  Labs (all labs ordered are listed, but only abnormal results are displayed) Labs Reviewed  APTT - Abnormal; Notable for the following components:      Result Value   aPTT 23 (*)    All other components within normal limits  CBC - Abnormal; Notable for the following components:   RBC 4.12 (*)    Hemoglobin 11.3 (*)    HCT 36.4 (*)    RDW 16.1 (*)    All other components within normal limits  COMPREHENSIVE METABOLIC PANEL - Abnormal; Notable for the following components:   Potassium 3.1 (*)    Glucose, Bld 208 (*)    BUN 27 (*)    Creatinine, Ser 1.58 (*)    Calcium 8.3 (*)    Total Protein 6.0 (*)    Albumin 3.0 (*)    GFR calc non Af Amer 40 (*)    GFR calc Af Amer 46 (*)    All other components within normal limits  TROPONIN I - Abnormal; Notable for the following components:   Troponin I 0.07 (*)    All other components within normal limits  PROTIME-INR  DIFFERENTIAL  MAGNESIUM  RAPID URINE DRUG SCREEN, HOSP PERFORMED  URINALYSIS, ROUTINE W REFLEX MICROSCOPIC  I-STAT TROPONIN, ED    EKG EKG Interpretation  Date/Time:  Wednesday October 25 2018 12:55:53 EST Ventricular Rate:  85 PR Interval:    QRS  Duration: 124 QT Interval:  434 QTC Calculation: 492 R Axis:   86 Text Interpretation:  Atrial fibrillation Nonspecific intraventricular conduction delay Probable anteroseptal infarct, old Repol abnrm suggests ischemia, diffuse leads new afib Confirmed by Varney Biles [phone]) on 10/15/2018 1:08:00 PM   Radiology Ct Head Code Stroke Wo Contrast  Result Date: 10/26/2018 CLINICAL DATA:  Code stroke.  Ataxia.  Syncope. EXAM: CT HEAD WITHOUT CONTRAST TECHNIQUE: Contiguous axial images were obtained from the base of the skull through the vertex without intravenous contrast. COMPARISON:  10/14/2006 FINDINGS: Brain: There is no evidence of acute infarct, intracranial hemorrhage, mass, midline shift, or extra-axial fluid collection. Mildly prominent, symmetric extra-axial CSF spaces over both frontal convexities are favored to be secondary to progressive cerebral atrophy rather than reflecting subdural hygromas. A chronic infarct is again noted involving the left basal ganglia and external capsule. Scattered hypodensities elsewhere in the cerebral white matter bilaterally have mildly progressed from the prior study and are nonspecific but compatible with mild chronic small vessel ischemic disease. Vascular: Calcified atherosclerosis at the skull base. No hyperdense vessel. Skull: No fracture or focal osseous lesion. Sinuses/Orbits: Near complete opacification of the right sphenoid sinus, likely by combination of fluid and mucosal thickening and with sclerosis and thickening of the sinus walls indicating an element of chronicity. Moderate bilateral ethmoid air cell mucosal thickening. Small volume fluid in the frontal sinuses. Clear mastoid air cells. Left cataract extraction. Other: None. ASPECTS [facility] Stroke Program Early CT Score) Not scored with  this history. IMPRESSION: 1. No evidence of acute intracranial abnormality. 2. Chronic left basal ganglia/external capsule infarct. 3. Mildly progressive cerebral  atrophy and chronic small vessel ischemic disease. These results were called by telephone at the time of interpretation on 10/09/2018 at 1:33 pm to Dr. Tomi Bamberger, who verbally acknowledged these results. Electronically Signed   By: Logan Bores M.D.   On: 10/14/2018 13:33    Procedures .Critical Care Performed by: Varney Biles, MD Authorized by: Varney Biles, MD   Critical care provider statement:    Critical care time (minutes):  45   Critical care time was exclusive of:  Separately billable procedures and treating other patients   Critical care was necessary to treat or prevent imminent or life-threatening deterioration of the following conditions:  CNS failure or compromise   Critical care was time spent personally by me on the following activities:  Discussions with consultants, evaluation of patient's response to treatment, examination of patient, ordering and performing treatments and interventions, ordering and review of laboratory studies, ordering and review of radiographic studies, pulse oximetry, re-evaluation of patient's condition, obtaining history from patient or surrogate and review of old charts   (including critical care time)  Medications Ordered in ED Medications - No data to display   Initial Impression / Assessment and Plan / ED Course  I have reviewed the triage vital signs and the nursing notes.  Pertinent labs & imaging results that were available during my care of the patient were reviewed by me and considered in my medical decision making (see chart for details).  Clinical Course as of Oct 26 1419  Wed Oct 25, 2018  1420 Neurology team has assessed the patient.  Patient now is oriented x3 and feeling a lot better.  They do not think this was a focal stroke.  Given that patient had bowel and bladder incontinence, and they still think seizure is a possibility.  Given that the other symptoms were associated with amnesia, they do not think this was a transient  global amnesia.  My suspicion is that patient likely had a tachydysrhythmia that probably led to change in mental status, patient becoming unresponsive, and possibly even having a seizure to syncope.  We will admit him for monitoring.  Heparin will be started.   [AN]    Clinical Course User Index [AN] Varney Biles, MD    85 year old male comes in a chief complaint of unresponsiveness. He is noted to be having new onset A. fib.  At ED arrival he is oriented x2 and appears confused.  He had a syncope-like event and is noted to be incontinent.  Patient has known history of CAD, CHF and TIA.  It appears that patient has amnesia.  There is no focal neurologic deficit but he is feeling constantly dizzy without having any dysmetria.  Initial differential diagnosis is that patient had a cryptogenic stroke given the new onset A. fib that is noted.  Other possibility includes that he had tachydysrhythmia and became hypotensive which led to a seizure/syncope type event.  It does not appear that there is any infection issue.  Given that patient was last normal within 6 hours, and perhaps even 3 hours and has constant dizziness we will call code stroke.  Final Clinical Impressions(s) / ED Diagnoses   Final diagnoses:  Syncope and collapse  Paroxysmal atrial fibrillation (HCC)  Transient alteration of awareness    ED Discharge Orders    None       Varney Biles, MD  10/23/2018 1435  

## 2018-10-25 NOTE — ED Notes (Signed)
CRITICAL VALUE ALERT  Critical Value:  Trop 0.07  Date & Time Notied:  10/19/2018, 1400  Provider Notified: Dr. Kathrynn Humble  Orders Received/Actions taken: no new orders at this time

## 2018-10-25 NOTE — H&P (Signed)
History and Physical    [name] [name] MVH:[id number] DOB: 1932-04-16 DOA: 10/02/2018  PCP: Merrilee Seashore, MD   Patient coming from: Home  Chief Complaint: Altered mental status  HPI: [name] [name] is a 85 y.o. male with medical history significant for CAD with prior CABG, prior TIAs, chronic systolic CHF with EF 40 to 45% in 2015, CKD stage III, chronic gout, dyslipidemia, and rheumatoid arthritis who presented to the ED via EMS after his wife noted the patient to be altered.  He did not recognize who she was and was behaving quite strangely.  She immediately called 911 and EMS upon arrival noticed that the patient was dyspneic as well as unconscious.  He was noted to have pinpoint pupils for which she was given some Narcan.  In route to the hospital.  Patient was noted to be diaphoretic and also lost bladder and bowel control, but there is no seizure activity noted.  Patient is currently much more alert and awake and is at his baseline per wife at bedside.  His wife does state that patient has had a chest cold over the last 2 months and had recently started taking some cough medication with narcotic in it.  Patient also states that he has been having some sensation of palpitations that change with his breathing patterns.  This has been new over the last couple days.   ED Course: Vital signs are noted to be stable, but EKG demonstrates atrial fibrillation which appears to be new onset with rate of 85 bpm.  Troponin was noted to be elevated at 0.07 and patient denies chest pain.  Potassium is 3.1, BUN is 27, and creatinine is 1.58.  CT of the head with no acute findings noted.  Patient has been started on a heparin drip.  Review of Systems: All others reviewed and otherwise negative.  Past Medical History:  Diagnosis Date  . CAD (coronary artery disease)    a. s/p  MI in 1988 and CABG 1989;  b. s/p redo CABG 2007;  c. s/p PCI DES to VG->PDA and native Diag;  d. 2010 Cath: occluded  VG's to RI and PDA, patent LIMA->LAD, patent stent in diag->med Rx.  . Carotid arterial disease ([place])    a. s/p bilat patch angioplasties;  b. 04/4131 RICA 4-40%, LICA 10-27%  . Chronic systolic CHF (congestive heart failure) ([place])    a. 03/2008 EF 30% noted on Myoview.  . CKD (chronic kidney disease), stage III ([place])   . Gout   . History of TIAs 2000  . HTN (hypertension)   . Hypercholesteremia   . Ischemic cardiomyopathy    a. 03/2008 EF 30% noted on Myoview.  . Prostate cancer ([place])   . Rheumatoid arthritis [facility])     Past Surgical History:  Procedure Laterality Date  . BACK SURGERY    . CARDIOVASCULAR STRESS TEST  04-02-08   EF 30%  . CAROTID ENDARTERECTOMY  1992, 2007   LEFT, RIGHT  . CHOLECYSTECTOMY N/A 06/22/2013   Procedure: LAPAROSCOPIC CHOLECYSTECTOMY;  Surgeon: Gayland Curry, MD;  Location: [facility];  Service: General;  Laterality: N/A;  . CORONARY ARTERY BYPASS GRAFT  [id number]  . KNEE ARTHROSCOPY     RIGHT KNEE  . RADIOACTIVE SEED IMPLANT  2005     reports that he quit smoking about 32 years ago. His smoking use included cigarettes. He has never used smokeless tobacco. He reports that he does not drink alcohol or use drugs.  No  Known Allergies  Family History  Problem Relation Age of Onset  . Other Father 20       CEREBRAL HEMORRHAGE  . Cancer Mother 46    Prior to Admission medications   Medication Sig Start Date End Date Taking? Authorizing Provider  allopurinol (ZYLOPRIM) 300 MG tablet TAKE 1 TABLET BY MOUTH EVERY DAY 10/31/15  Yes [place]ique, Peter M, MD  amLODipine (NORVASC) 10 MG tablet Take 10 mg by mouth daily.     Yes [provider]  aspirin 81 MG tablet Take 81 mg by mouth daily.     Yes [provider]  atorvastatin (LIPITOR) 40 MG tablet Take 40 mg by mouth 2 (two) times daily.    Yes [provider]  clopidogrel (PLAVIX) 75 MG tablet TAKE 1 TABLET BY MOUTH EVERY DAY 06/23/18  Yes [place], Peter M, MD  folic acid (FOLVITE) 0.5 MG  tablet Take 0.5 mg by mouth daily.   Yes [provider]  hydrALAZINE (APRESOLINE) 25 MG tablet TAKE 1 TABLET BY MOUTH THREE TIMES A DAY 06/22/18  Yes [place], Peter M, MD  hydrochlorothiazide (MICROZIDE) 12.5 MG capsule Take 12.5 mg by mouth daily.   Yes [provider]  isosorbide mononitrate (IMDUR) 30 MG 24 hr tablet TAKE 1 TABLET BY MOUTH TWICE A DAY 08/14/18  Yes [place], Peter M, MD  ketoconazole (NIZORAL) 2 % shampoo See admin instructions. 04/24/18  Yes [provider]  leflunomide (ARAVA) 10 MG tablet Take 10 mg by mouth daily. 03/29/18  Yes [provider]  metFORMIN (GLUCOPHAGE-XR) 500 MG 24 hr tablet TAKE 1 TABLET BY MOUTH EVERY DAY WITH EVENING MEAL 03/09/18  Yes [provider]  metoprolol tartrate (LOPRESSOR) 25 MG tablet Take 12.5 mg by mouth 2 (two) times daily.     Yes [provider]  azithromycin (ZITHROMAX) 250 MG tablet Take 1 tablet by mouth 2 (two) times daily. 08/25/18   [provider]  desonide (DESOWEN) 0.05 % lotion Apply 1 application topically as needed.  08/13/18   [provider]  ergocalciferol (VITAMIN D2) 50000 UNITS capsule Take 50,000 Units by mouth every 30 (thirty) days. Twice monthly 1st and 15th    [provider]  methotrexate (RHEUMATREX) 2.5 MG tablet Take 4 tablets by mouth once a week. On mondays 08/18/18   [provider]  nitroGLYCERIN (NITROSTAT) 0.4 MG SL tablet Place 1 tablet (0.4 mg total) under the tongue every 5 (five) minutes as needed. 06/25/16   [place], Peter M, MD    Physical Exam: Vitals:   10/14/2018 1325 10/02/2018 1345 10/11/2018 1350 10/22/2018 1430  BP: (!) 102/49 109/69  135/66  Pulse: 93 85  (!) 40  Resp: (!) 21 (!) 24  (!) 33  Weight:   77.9 kg   Height:   6\' 1"  (1.854 m)     Constitutional: NAD, calm, comfortable Vitals:   10/08/2018 1325 10/09/2018 1345 10/16/2018 1350 10/15/2018 1430  BP: (!) 102/49 109/69  135/66  Pulse: 93 85  (!) 40  Resp: (!) 21  (!) 24  (!) 33  Weight:   77.9 kg   Height:   6\' 1"  (1.854 m)    Eyes: lids and conjunctivae normal ENMT: Mucous membranes are moist.  Neck: normal, supple Respiratory: clear to auscultation bilaterally. Normal respiratory effort. No accessory muscle use.  Cardiovascular: Irregular rate and rhythm, no murmurs. No extremity edema. Abdomen: no tenderness, no distention. Bowel sounds positive.  Musculoskeletal:  No joint deformity upper and lower  extremities.   Skin: no rashes, lesions, ulcers.  Psychiatric: Normal judgment and insight. Alert and oriented x 3. Normal mood.   Labs on Admission: I have personally reviewed following labs and imaging studies  CBC: Recent Labs  Lab 10/10/2018 1314  WBC 6.6  NEUTROABS 5.0  HGB 11.3*  HCT 36.4*  MCV 88.3  PLT 254   Basic Metabolic Panel: Recent Labs  Lab 10/17/2018 1314  NA 136  K 3.1*  CL 102  CO2 22  GLUCOSE 208*  BUN 27*  CREATININE 1.58*  CALCIUM 8.3*  MG 2.2   GFR: Estimated Creatinine Clearance: 38.3 mL/min (A) (by C-G formula based on SCr of 1.58 mg/dL (H)). Liver Function Tests: Recent Labs  Lab 10/26/2018 1314  AST 34  ALT 16  ALKPHOS 83  BILITOT 1.0  PROT 6.0*  ALBUMIN 3.0*   No results for input(s): LIPASE, AMYLASE in the last 168 hours. No results for input(s): AMMONIA in the last 168 hours. Coagulation Profile: Recent Labs  Lab 10/14/2018 1314  INR 1.08   Cardiac Enzymes: Recent Labs  Lab 09/27/2018 1314  TROPONINI 0.07*   BNP (last 3 results) No results for input(s): PROBNP in the last 8760 hours. HbA1C: No results for input(s): HGBA1C in the last 72 hours. CBG: No results for input(s): GLUCAP in the last 168 hours. Lipid Profile: No results for input(s): CHOL, HDL, LDLCALC, TRIG, CHOLHDL, LDLDIRECT in the last 72 hours. Thyroid Function Tests: No results for input(s): TSH, T4TOTAL, FREET4, T3FREE, THYROIDAB in the last 72 hours. Anemia Panel: No results for input(s): VITAMINB12, FOLATE,  FERRITIN, TIBC, IRON, RETICCTPCT in the last 72 hours. Urine analysis:    Component Value Date/Time   COLORURINE YELLOW 09/09/2016 1843   APPEARANCEUR CLEAR 09/09/2016 1843   LABSPEC 1.023 09/09/2016 1843   PHURINE 5.0 09/09/2016 1843   GLUCOSEU NEGATIVE 09/09/2016 1843   HGBUR NEGATIVE 09/09/2016 1843   BILIRUBINUR NEGATIVE 09/09/2016 1843   KETONESUR NEGATIVE 09/09/2016 1843   PROTEINUR 100 (A) 09/09/2016 1843   UROBILINOGEN 0.2 06/20/2013 0659   NITRITE NEGATIVE 09/09/2016 1843   LEUKOCYTESUR NEGATIVE 09/09/2016 1843    Radiological Exams on Admission: Ct Head Code Stroke Wo Contrast  Result Date: 10/22/2018 CLINICAL DATA:  Code stroke.  Ataxia.  Syncope. EXAM: CT HEAD WITHOUT CONTRAST TECHNIQUE: Contiguous axial images were obtained from the base of the skull through the vertex without intravenous contrast. COMPARISON:  10/14/2006 FINDINGS: Brain: There is no evidence of acute infarct, intracranial hemorrhage, mass, midline shift, or extra-axial fluid collection. Mildly prominent, symmetric extra-axial CSF spaces over both frontal convexities are favored to be secondary to progressive cerebral atrophy rather than reflecting subdural hygromas. A chronic infarct is again noted involving the left basal ganglia and external capsule. Scattered hypodensities elsewhere in the cerebral white matter bilaterally have mildly progressed from the prior study and are nonspecific but compatible with mild chronic small vessel ischemic disease. Vascular: Calcified atherosclerosis at the skull base. No hyperdense vessel. Skull: No fracture or focal osseous lesion. Sinuses/Orbits: Near complete opacification of the right sphenoid sinus, likely by combination of fluid and mucosal thickening and with sclerosis and thickening of the sinus walls indicating an element of chronicity. Moderate bilateral ethmoid air cell mucosal thickening. Small volume fluid in the frontal sinuses. Clear mastoid air cells. Left  cataract extraction. Other: None. ASPECTS [facility] Stroke Program Early CT Score) Not scored with this history. IMPRESSION: 1. No evidence of acute intracranial abnormality. 2. Chronic left basal ganglia/external capsule infarct. 3. Mildly  progressive cerebral atrophy and chronic small vessel ischemic disease. These results were called by telephone at the time of interpretation on 10/27/2018 at 1:33 pm to Dr. Tomi Bamberger, who verbally acknowledged these results. Electronically Signed   By: Logan Bores M.D.   On: 10/27/2018 13:33    EKG: Independently reviewed.  Atrial fibrillation 85 bpm.  Assessment/Plan Principal Problem:   Acute encephalopathy Active Problems:   CAD (coronary artery disease)   HTN (hypertension)   Gout   History of TIAs   Chronic systolic CHF (congestive heart failure) (HCC)   Type 2 diabetes mellitus with hyperlipidemia ([place])    1. Acute encephalopathy-unknown cause.  This may be related to a CVA versus seizure like activity and/or potentially cardiogenic syncope.  Further work-up will be needed with brain MRI, EEG, carotid ultrasound, and 2D echocardiogram for further evaluation.  Will consult neurology as well as cardiology to assist with management.  PT/OT evaluation as well as fall precautions.  Will check lipid panel as well as hemoglobin A1c. 2. Troponin elevation in the setting of CAD.  Patient is without chest pain and we will continue to trend and check 2D echocardiogram.  Maintain on heparin drip.  Appreciate cardiology evaluation.  Maintain on aspirin, beta-blocker, and atorvastatin. 3. New onset atrial fibrillation.  Patient is currently rate controlled.  Increase metoprolol to 25 mg twice daily to maintain rate control monitor on telemetry.  Maintain on heparin drip.  Check TSH and 2D echocardiogram. 4. Hypertension.  Will withhold amlodipine, HCTZ, and hydralazine at this time, but maintain on metoprolol.  This is to allow for permissive hypertension in the case of  CVA.  Will resume these medications as needed. 5. Type 2 diabetes.  Hold metformin for now and maintain on SSI as well as carb modified diet. 6. Gout.  Maintain on allopurinol. 7. Chronic systolic CHF.  Appears to be euvolemic currently.  Will check 2D echocardiogram and check daily weights. 8. RA.  Maintain on [place] as well as methotrexate.   DVT prophylaxis: Heparin drip Code Status: Full Family Communication: Wife and niece at bedside Disposition Plan: Admit for evaluation of CVA and new onset A. fib with RVR Consults called: Neurology and cardiology Admission status: Inpatient, telemetry   [facility] Pager [phone]  If 7PM-7AM, please contact night-coverage [email or web address] Password [email or web address]  10/15/2018, 2:50 PM

## 2018-10-25 NOTE — Consult Note (Signed)
[facility] Tele[facility]urology Consult Services   Date of Service:   10/06/2018 13:25:43  Impression:     .  RO Acute Ischemic Stroke  Comments: Patient is a 85 years old man with history of afib who presents to the ED after becoming confused at home. He was also diaphoretic and had SOB. Symptoms are now improved although patient still c/o intermittent SOB. Non contrast CTH showed no acute abnormalities. Presentation is atypical for stroke however, he has history of afib and is not currently anticoagulated which puts him at high risk. Seizure is also on the DDx. Recommend admission for further work-up.  Metrics: Last Known Well: 10/24/2018 07:30:00 [facility] Notification Time: 10/24/2018 13:25:43 Arrival Time: 10/24/2018 12:42:00 Stamp Time: 10/13/2018 13:25:43 Time First Login Attempt: 10/23/2018 13:32:19 Video Start Time: 10/07/2018 13:32:19  Symptoms: Confusion, diaphoresis, urinary incontinence NIHSS Start Assessment Time: 10/16/2018 13:35:14 Patient is not a candidate for tPA. Patient was not deemed candidate for tPA thrombolytics because of Last Well Known Above 4.5 Hours. Video End Time: 10/03/2018 13:41:24  CT head showed no acute hemorrhage or acute core infarct.  Presentation is not suggestive of Large Vessel Occlusive disease.  Advanced imaging was not obtained as the presentation was not suggestive of Large Vessel Occlusive Disease.   ED Physician notified of diagnostic impression and management plan on 10/16/2018 13:42:54  Our recommendations are outlined below.  Recommendations:     .  Activate Stroke Protocol Admission/Order Set     .  Stroke/Telemetry Floor     .  Neuro Checks     .  Bedside Swallow Eval     .  DVT Prophylaxis     .  IV Fluids, Normal Saline     .  Head of Bed Below 30 Degrees     .  Euglycemia and Avoid Hyperthermia (PRN Acetaminophen)     .  Antiplatelet Therapy Recommended  Routine Consultation with [facility] for Follow  up Care  Sign Out:     .  Discussed with Emergency Department Provider    ------------------------------------------------------------------------------  History of Present Illness: Patient is a 85 year old Male.  Patient was brought by EMS for symptoms of Confusion, diaphoresis, urinary incontinence  Patient is a 85 years old man with history of recently diagnosed afib on Plavix and HTN who presents to the ED after his wife found him confused and gasping for air. EMS was called and on their arrival patient was poorly responsive, was diaphoretic, and had urinary incontinence. Symptoms have improved since then.  CT head showed no acute hemorrhage or acute core infarct.  Last seen normal was beyond 4.5 hours of presentation. There is no history of hemorrhagic complications or intracranial hemorrhage. There is no history of Recent Anticoagulants. There is no history of recent major surgery. There is no history of recent stroke.  Examination: 1A: Level of Consciousness - Alert; keenly responsive + 0 1B: Ask Month and Age - Both Questions Right + 0 1C: Blink Eyes & Squeeze Hands - Performs Both Tasks + 0 2: Test Horizontal Extraocular Movements - Normal + 0 3: Test Visual Fields - No Visual Loss + 0 4: Test Facial Palsy (Use Grimace if Obtunded) - Normal symmetry + 0 5A: Test Left Arm Motor Drift - No Drift for 10 Seconds + 0 5B: Test Right Arm Motor Drift - No Drift for 10 Seconds + 0 6A: Test Left Leg Motor Drift - No Drift for 5 Seconds + 0 6B: Test Right Leg Motor Drift -  No Drift for 5 Seconds + 0 7: Test Limb Ataxia (FNF/Heel-Shin) - No Ataxia + 0 8: Test Sensation - Normal; No sensory loss + 0 9: Test Language/Aphasia - Normal; No aphasia + 0 10: Test Dysarthria - Normal + 0 11: Test Extinction/Inattention - No abnormality + 0  NIHSS Score: 0  Patient was informed the Neurology Consult would happen via TeleHealth consult by way of interactive audio and video  telecommunications and consented to receiving care in this manner.  Due to the immediate potential for life-threatening deterioration due to underlying acute neurologic illness, I spent 35 minutes providing critical care. This time includes time for face to face visit via telemedicine, review of medical records, imaging studies and discussion of findings with providers, the patient and/or family.   Dr Harriet Masson   [facility] [phone]   Case [id number]

## 2018-10-26 ENCOUNTER — Inpatient Hospital Stay (HOSPITAL_COMMUNITY): Payer: Medicare HMO

## 2018-10-26 ENCOUNTER — Encounter (HOSPITAL_COMMUNITY): Payer: Self-pay | Admitting: Cardiovascular Disease

## 2018-10-26 ENCOUNTER — Inpatient Hospital Stay: Payer: Self-pay

## 2018-10-26 DIAGNOSIS — I4901 Ventricular fibrillation: Secondary | ICD-10-CM

## 2018-10-26 DIAGNOSIS — E1169 Type 2 diabetes mellitus with other specified complication: Secondary | ICD-10-CM

## 2018-10-26 DIAGNOSIS — I340 Nonrheumatic mitral (valve) insufficiency: Secondary | ICD-10-CM

## 2018-10-26 DIAGNOSIS — J9601 Acute respiratory failure with hypoxia: Secondary | ICD-10-CM

## 2018-10-26 DIAGNOSIS — I469 Cardiac arrest, cause unspecified: Secondary | ICD-10-CM | POA: Clinically undetermined

## 2018-10-26 DIAGNOSIS — N183 Chronic kidney disease, stage 3 unspecified: Secondary | ICD-10-CM | POA: Diagnosis present

## 2018-10-26 DIAGNOSIS — I2102 ST elevation (STEMI) myocardial infarction involving left anterior descending coronary artery: Secondary | ICD-10-CM | POA: Diagnosis present

## 2018-10-26 DIAGNOSIS — I25709 Atherosclerosis of coronary artery bypass graft(s), unspecified, with unspecified angina pectoris: Secondary | ICD-10-CM

## 2018-10-26 DIAGNOSIS — I25110 Atherosclerotic heart disease of native coronary artery with unstable angina pectoris: Secondary | ICD-10-CM | POA: Diagnosis present

## 2018-10-26 DIAGNOSIS — I2510 Atherosclerotic heart disease of native coronary artery without angina pectoris: Secondary | ICD-10-CM

## 2018-10-26 DIAGNOSIS — Z8673 Personal history of transient ischemic attack (TIA), and cerebral infarction without residual deficits: Secondary | ICD-10-CM

## 2018-10-26 DIAGNOSIS — I501 Left ventricular failure: Secondary | ICD-10-CM | POA: Diagnosis present

## 2018-10-26 DIAGNOSIS — R0902 Hypoxemia: Secondary | ICD-10-CM

## 2018-10-26 DIAGNOSIS — I5030 Unspecified diastolic (congestive) heart failure: Secondary | ICD-10-CM

## 2018-10-26 DIAGNOSIS — I5043 Acute on chronic combined systolic (congestive) and diastolic (congestive) heart failure: Secondary | ICD-10-CM

## 2018-10-26 DIAGNOSIS — E785 Hyperlipidemia, unspecified: Secondary | ICD-10-CM

## 2018-10-26 DIAGNOSIS — G9340 Encephalopathy, unspecified: Secondary | ICD-10-CM

## 2018-10-26 DIAGNOSIS — I110 Hypertensive heart disease with heart failure: Secondary | ICD-10-CM

## 2018-10-26 LAB — HEPATIC FUNCTION PANEL
ALT: 69 U/L — ABNORMAL HIGH (ref 0–44)
AST: 282 U/L — ABNORMAL HIGH (ref 15–41)
Albumin: 2.7 g/dL — ABNORMAL LOW (ref 3.5–5.0)
Alkaline Phosphatase: 83 U/L (ref 38–126)
BILIRUBIN INDIRECT: 1 mg/dL — AB (ref 0.3–0.9)
Bilirubin, Direct: 0.2 mg/dL (ref 0.0–0.2)
Total Bilirubin: 1.2 mg/dL (ref 0.3–1.2)
Total Protein: 5.6 g/dL — ABNORMAL LOW (ref 6.5–8.1)

## 2018-10-26 LAB — BASIC METABOLIC PANEL
ANION GAP: 14 (ref 5–15)
Anion gap: 15 (ref 5–15)
Anion gap: 10 (ref 5–15)
BUN: 24 mg/dL — ABNORMAL HIGH (ref 8–23)
BUN: 24 mg/dL — ABNORMAL HIGH (ref 8–23)
BUN: 26 mg/dL — AB (ref 8–23)
CALCIUM: 8.1 mg/dL — AB (ref 8.9–10.3)
CO2: 18 mmol/L — ABNORMAL LOW (ref 22–32)
CO2: 19 mmol/L — ABNORMAL LOW (ref 22–32)
CO2: 21 mmol/L — ABNORMAL LOW (ref 22–32)
Calcium: 8.1 mg/dL — ABNORMAL LOW (ref 8.9–10.3)
Calcium: 8.2 mg/dL — ABNORMAL LOW (ref 8.9–10.3)
Chloride: 100 mmol/L (ref 98–111)
Chloride: 101 mmol/L (ref 98–111)
Chloride: 103 mmol/L (ref 98–111)
Creatinine, Ser: 1.45 mg/dL — ABNORMAL HIGH (ref 0.61–1.24)
Creatinine, Ser: 1.52 mg/dL — ABNORMAL HIGH (ref 0.61–1.24)
Creatinine, Ser: 1.97 mg/dL — ABNORMAL HIGH (ref 0.61–1.24)
GFR calc Af Amer: 51 mL/min — ABNORMAL LOW (ref >60)
GFR calc Af Amer: 48 mL/min — ABNORMAL LOW (ref >60)
GFR calc Af Amer: 35 mL/min — ABNORMAL LOW (ref >60)
GFR calc non Af Amer: 44 mL/min — ABNORMAL LOW (ref >60)
GFR calc non Af Amer: 41 mL/min — ABNORMAL LOW (ref >60)
GFR, EST NON AFRICAN AMERICAN: 30 mL/min — AB (ref >60)
GLUCOSE: 162 mg/dL — AB (ref 70–99)
Glucose, Bld: 210 mg/dL — ABNORMAL HIGH (ref 70–99)
Glucose, Bld: 232 mg/dL — ABNORMAL HIGH (ref 70–99)
POTASSIUM: 3.2 mmol/L — AB (ref 3.5–5.1)
Potassium: 3.2 mmol/L — ABNORMAL LOW (ref 3.5–5.1)
Potassium: 4.9 mmol/L (ref 3.5–5.1)
Sodium: 133 mmol/L — ABNORMAL LOW (ref 135–145)
Sodium: 134 mmol/L — ABNORMAL LOW (ref 135–145)
Sodium: 134 mmol/L — ABNORMAL LOW (ref 135–145)

## 2018-10-26 LAB — POCT I-STAT 7, (LYTES, BLD GAS, ICA,H+H)
Acid-base deficit: 4 mmol/L — ABNORMAL HIGH (ref 0.0–2.0)
Acid-base deficit: 1 mmol/L (ref 0.0–2.0)
Acid-base deficit: 3 mmol/L — ABNORMAL HIGH (ref 0.0–2.0)
Bicarbonate: 18.3 mmol/L — ABNORMAL LOW (ref 20.0–28.0)
Bicarbonate: 22.5 mmol/L (ref 20.0–28.0)
Bicarbonate: 19.8 mmol/L — ABNORMAL LOW (ref 20.0–28.0)
Calcium, Ion: 1.01 mmol/L — ABNORMAL LOW (ref 1.15–1.40)
Calcium, Ion: 1.03 mmol/L — ABNORMAL LOW (ref 1.15–1.40)
Calcium, Ion: 1.05 mmol/L — ABNORMAL LOW (ref 1.15–1.40)
HCT: 29 % — ABNORMAL LOW (ref 39.0–52.0)
HCT: 26 % — ABNORMAL LOW (ref 39.0–52.0)
HEMATOCRIT: 31 % — AB (ref 39.0–52.0)
Hemoglobin: 10.5 g/dL — ABNORMAL LOW (ref 13.0–17.0)
Hemoglobin: 9.9 g/dL — ABNORMAL LOW (ref 13.0–17.0)
Hemoglobin: 8.8 g/dL — ABNORMAL LOW (ref 13.0–17.0)
O2 SAT: 92 %
O2 Saturation: 100 %
O2 Saturation: 96 %
POTASSIUM: 3.2 mmol/L — AB (ref 3.5–5.1)
POTASSIUM: 4 mmol/L (ref 3.5–5.1)
Patient temperature: 36.7
Patient temperature: 98.6
Potassium: 3.9 mmol/L (ref 3.5–5.1)
Sodium: 134 mmol/L — ABNORMAL LOW (ref 135–145)
Sodium: 135 mmol/L (ref 135–145)
Sodium: 134 mmol/L — ABNORMAL LOW (ref 135–145)
TCO2: 19 mmol/L — ABNORMAL LOW (ref 22–32)
TCO2: 23 mmol/L (ref 22–32)
TCO2: 21 mmol/L — ABNORMAL LOW (ref 22–32)
pCO2 arterial: 25.4 mmHg — ABNORMAL LOW (ref 32.0–48.0)
pCO2 arterial: 31 mmHg — ABNORMAL LOW (ref 32.0–48.0)
pCO2 arterial: 27.1 mmHg — ABNORMAL LOW (ref 32.0–48.0)
pH, Arterial: 7.467 — ABNORMAL HIGH (ref 7.350–7.450)
pH, Arterial: 7.468 — ABNORMAL HIGH (ref 7.350–7.450)
pH, Arterial: 7.471 — ABNORMAL HIGH (ref 7.350–7.450)
pO2, Arterial: 59 mmHg — ABNORMAL LOW (ref 83.0–108.0)
pO2, Arterial: 164 mmHg — ABNORMAL HIGH (ref 83.0–108.0)
pO2, Arterial: 77 mmHg — ABNORMAL LOW (ref 83.0–108.0)

## 2018-10-26 LAB — CBC
HCT: 34.4 % — ABNORMAL LOW (ref 39.0–52.0)
Hemoglobin: 10.6 g/dL — ABNORMAL LOW (ref 13.0–17.0)
MCH: 27 pg (ref 26.0–34.0)
MCHC: 30.8 g/dL (ref 30.0–36.0)
MCV: 87.5 fL (ref 80.0–100.0)
PLATELETS: 288 10*3/uL (ref 150–400)
RBC: 3.93 MIL/uL — ABNORMAL LOW (ref 4.22–5.81)
RDW: 16.3 % — ABNORMAL HIGH (ref 11.5–15.5)
WBC: 10.1 10*3/uL (ref 4.0–10.5)
nRBC: 0 % (ref 0.0–0.2)

## 2018-10-26 LAB — TYPE AND SCREEN
ABO/RH(D): A NEG
Antibody Screen: NEGATIVE

## 2018-10-26 LAB — COOXEMETRY PANEL
Carboxyhemoglobin: 1 % (ref 0.5–1.5)
Methemoglobin: 0.9 % (ref 0.0–1.5)
O2 Saturation: 53.8 %
Total hemoglobin: 9.9 g/dL — ABNORMAL LOW (ref 12.0–16.0)

## 2018-10-26 LAB — MRSA PCR SCREENING: MRSA by PCR: NEGATIVE

## 2018-10-26 LAB — LACTIC ACID, PLASMA
Lactic Acid, Venous: 1.9 mmol/L (ref 0.5–1.9)
Lactic Acid, Venous: 1.8 mmol/L (ref 0.5–1.9)

## 2018-10-26 LAB — ECHOCARDIOGRAM COMPLETE
Height: 73 in
Weight: 2641.99 oz

## 2018-10-26 LAB — GLUCOSE, CAPILLARY
Glucose-Capillary: 187 mg/dL — ABNORMAL HIGH (ref 70–99)
Glucose-Capillary: 130 mg/dL — ABNORMAL HIGH (ref 70–99)

## 2018-10-26 LAB — MAGNESIUM
Magnesium: 2.7 mg/dL — ABNORMAL HIGH (ref 1.7–2.4)
Magnesium: 2.5 mg/dL — ABNORMAL HIGH (ref 1.7–2.4)

## 2018-10-26 LAB — POCT ACTIVATED CLOTTING TIME: Activated Clotting Time: 362 seconds

## 2018-10-26 MED ORDER — LIDOCAINE HCL (PF) 1 % IJ SOLN
INTRAMUSCULAR | Status: DC | PRN
  Administered 2018-10-26: 17 mL

## 2018-10-26 MED ORDER — POTASSIUM CHLORIDE 10 MEQ/100ML IV SOLN
10.0000 meq | INTRAVENOUS | Status: AC
  Administered 2018-10-26 (×3): 10 meq via INTRAVENOUS
  Filled 2018-10-26: qty 100

## 2018-10-26 MED ORDER — SODIUM CHLORIDE 0.9% FLUSH
3.0000 mL | Freq: Two times a day (BID) | INTRAVENOUS | Status: DC
  Administered 2018-10-26 – 2018-10-28 (×4): 3 mL via INTRAVENOUS

## 2018-10-26 MED ORDER — ATORVASTATIN CALCIUM 80 MG PO TABS
80.0000 mg | ORAL_TABLET | Freq: Every day | ORAL | Status: DC
  Administered 2018-10-26: 80 mg via ORAL
  Filled 2018-10-26: qty 1

## 2018-10-26 MED ORDER — BIVALIRUDIN TRIFLUOROACETATE 250 MG IV SOLR
INTRAVENOUS | Status: AC
  Filled 2018-10-26: qty 250

## 2018-10-26 MED ORDER — FUROSEMIDE 10 MG/ML IJ SOLN
INTRAMUSCULAR | Status: DC | PRN
  Administered 2018-10-26: 20 mg via INTRAVENOUS

## 2018-10-26 MED ORDER — TICAGRELOR 90 MG PO TABS
ORAL_TABLET | ORAL | Status: DC | PRN
  Administered 2018-10-26: 180 mg via ORAL

## 2018-10-26 MED ORDER — ASPIRIN 81 MG PO CHEW
81.0000 mg | CHEWABLE_TABLET | Freq: Every day | ORAL | Status: DC
  Administered 2018-10-27: 81 mg via ORAL
  Filled 2018-10-26 (×2): qty 1

## 2018-10-26 MED ORDER — PERFLUTREN LIPID MICROSPHERE
INTRAVENOUS | Status: AC
  Administered 2018-10-26: 1 mL via INTRAVENOUS
  Filled 2018-10-26: qty 10

## 2018-10-26 MED ORDER — MORPHINE SULFATE (PF) 2 MG/ML IV SOLN
1.0000 mg | Freq: Once | INTRAVENOUS | Status: AC
  Administered 2018-10-26: 1 mg via INTRAVENOUS
  Filled 2018-10-26: qty 1

## 2018-10-26 MED ORDER — BIVALIRUDIN BOLUS VIA INFUSION - CUPID
INTRAVENOUS | Status: DC | PRN
  Administered 2018-10-26: 56.925 mg via INTRAVENOUS

## 2018-10-26 MED ORDER — LORAZEPAM 2 MG/ML IJ SOLN
0.2500 mg | Freq: Three times a day (TID) | INTRAMUSCULAR | Status: DC | PRN
  Administered 2018-10-26 (×2): 0.25 mg via INTRAVENOUS
  Filled 2018-10-26: qty 1

## 2018-10-26 MED ORDER — MILRINONE LACTATE IN DEXTROSE 20-5 MG/100ML-% IV SOLN
0.2500 ug/kg/min | INTRAVENOUS | Status: DC
  Administered 2018-10-26 – 2018-10-27 (×3): 0.25 ug/kg/min via INTRAVENOUS
  Filled 2018-10-26 (×3): qty 100

## 2018-10-26 MED ORDER — LABETALOL HCL 5 MG/ML IV SOLN: 10.0000 mg | INTRAVENOUS | Status: AC | PRN

## 2018-10-26 MED ORDER — SODIUM CHLORIDE 0.9% FLUSH: 3.0000 mL | INTRAVENOUS | Status: DC | PRN

## 2018-10-26 MED ORDER — POTASSIUM CHLORIDE 10 MEQ/100ML IV SOLN
10.0000 meq | INTRAVENOUS | Status: AC
  Administered 2018-10-26 (×3): 10 meq via INTRAVENOUS

## 2018-10-26 MED ORDER — NITROGLYCERIN 1 MG/10 ML FOR IR/CATH LAB
INTRA_ARTERIAL | Status: AC
  Filled 2018-10-26: qty 10

## 2018-10-26 MED ORDER — ONDANSETRON HCL 4 MG/2ML IJ SOLN: 4.0000 mg | Freq: Four times a day (QID) | INTRAMUSCULAR | Status: DC | PRN

## 2018-10-26 MED ORDER — POTASSIUM CHLORIDE CRYS ER 20 MEQ PO TBCR
40.0000 meq | EXTENDED_RELEASE_TABLET | ORAL | Status: AC
  Filled 2018-10-26: qty 2

## 2018-10-26 MED ORDER — NITROGLYCERIN 1 MG/10 ML FOR IR/CATH LAB
INTRA_ARTERIAL | Status: DC | PRN
  Administered 2018-10-26 (×3): 100 ug via INTRACORONARY

## 2018-10-26 MED ORDER — FUROSEMIDE 10 MG/ML IJ SOLN
INTRAMUSCULAR | Status: AC
  Filled 2018-10-26: qty 4

## 2018-10-26 MED ORDER — SODIUM CHLORIDE 0.9 % IV SOLN
250.0000 mL | INTRAVENOUS | Status: DC | PRN
  Administered 2018-10-27: 250 mL via INTRAVENOUS

## 2018-10-26 MED ORDER — FUROSEMIDE 10 MG/ML IJ SOLN
60.0000 mg | Freq: Once | INTRAMUSCULAR | Status: AC
  Administered 2018-10-26: 60 mg via INTRAVENOUS
  Filled 2018-10-26: qty 6

## 2018-10-26 MED ORDER — HYDRALAZINE HCL 20 MG/ML IJ SOLN: 5.0000 mg | INTRAMUSCULAR | Status: AC | PRN

## 2018-10-26 MED ORDER — FUROSEMIDE 10 MG/ML IJ SOLN
80.0000 mg | Freq: Once | INTRAMUSCULAR | Status: AC
  Administered 2018-10-26: 80 mg via INTRAVENOUS
  Filled 2018-10-26: qty 8

## 2018-10-26 MED ORDER — SODIUM CHLORIDE 0.9 % IV SOLN
INTRAVENOUS | Status: AC | PRN
  Administered 2018-10-26: 10 mL/h via INTRAVENOUS

## 2018-10-26 MED ORDER — LIDOCAINE HCL (PF) 1 % IJ SOLN
INTRAMUSCULAR | Status: AC
  Filled 2018-10-26: qty 30

## 2018-10-26 MED ORDER — PERFLUTREN LIPID MICROSPHERE
1.0000 mL | INTRAVENOUS | Status: AC | PRN
  Administered 2018-10-26: 1 mL via INTRAVENOUS
  Filled 2018-10-26: qty 10

## 2018-10-26 MED ORDER — INSULIN ASPART 100 UNIT/ML ~~LOC~~ SOLN
0.0000 [IU] | Freq: Three times a day (TID) | SUBCUTANEOUS | Status: DC
  Administered 2018-10-26: 2 [IU] via SUBCUTANEOUS
  Administered 2018-10-26: 1 [IU] via SUBCUTANEOUS
  Administered 2018-10-27: 2 [IU] via SUBCUTANEOUS

## 2018-10-26 MED ORDER — TICAGRELOR 90 MG PO TABS
90.0000 mg | ORAL_TABLET | Freq: Two times a day (BID) | ORAL | Status: DC
  Administered 2018-10-26 – 2018-10-27 (×3): 90 mg via ORAL
  Filled 2018-10-26 (×3): qty 1

## 2018-10-26 MED ORDER — CARVEDILOL 3.125 MG PO TABS
3.1250 mg | ORAL_TABLET | Freq: Two times a day (BID) | ORAL | Status: DC
  Administered 2018-10-26 (×2): 3.125 mg via ORAL
  Filled 2018-10-26 (×2): qty 1

## 2018-10-26 MED ORDER — TICAGRELOR 90 MG PO TABS: 90.0000 mg | ORAL_TABLET | Freq: Two times a day (BID) | ORAL | Status: DC

## 2018-10-26 MED ORDER — CLEVIDIPINE BUTYRATE 0.5 MG/ML IV EMUL
0.0000 mg/h | INTRAVENOUS | Status: DC
  Administered 2018-10-26: 1 mg/h via INTRAVENOUS
  Filled 2018-10-26 (×2): qty 50

## 2018-10-26 MED ORDER — TICAGRELOR 90 MG PO TABS
ORAL_TABLET | ORAL | Status: AC
  Filled 2018-10-26: qty 2

## 2018-10-26 MED ORDER — SODIUM CHLORIDE 0.9 % IV SOLN
INTRAVENOUS | Status: DC
  Administered 2018-10-26 – 2018-10-29 (×4): via INTRAVENOUS

## 2018-10-26 MED ORDER — CARVEDILOL 6.25 MG PO TABS: 6.2500 mg | ORAL_TABLET | Freq: Two times a day (BID) | ORAL | Status: DC

## 2018-10-26 MED ORDER — HEPARIN (PORCINE) IN NACL 1000-0.9 UT/500ML-% IV SOLN
INTRAVENOUS | Status: AC
  Filled 2018-10-26: qty 1000

## 2018-10-26 MED ORDER — SODIUM CHLORIDE 0.9 % IV SOLN
INTRAVENOUS | Status: AC | PRN
  Administered 2018-10-26 (×2): 1.75 mg/kg/h via INTRAVENOUS

## 2018-10-26 MED ORDER — HEPARIN (PORCINE) IN NACL 1000-0.9 UT/500ML-% IV SOLN
INTRAVENOUS | Status: DC | PRN
  Administered 2018-10-26 (×3): 500 mL

## 2018-10-26 MED ORDER — LORAZEPAM 0.5 MG PO TABS: 0.2500 mg | ORAL_TABLET | Freq: Three times a day (TID) | ORAL | Status: DC | PRN

## 2018-10-26 MED ORDER — FUROSEMIDE 10 MG/ML IJ SOLN
40.0000 mg | Freq: Once | INTRAMUSCULAR | Status: AC
  Administered 2018-10-26: 40 mg via INTRAVENOUS
  Filled 2018-10-26: qty 4

## 2018-10-26 MED ORDER — IOHEXOL 350 MG/ML SOLN
INTRAVENOUS | Status: DC | PRN
  Administered 2018-10-26: 215 mL via INTRA_ARTERIAL

## 2018-10-26 MED ORDER — ACETAMINOPHEN 325 MG PO TABS: 650.0000 mg | ORAL_TABLET | ORAL | Status: DC | PRN

## 2018-10-26 MED ORDER — ATORVASTATIN CALCIUM 40 MG PO TABS: 40.0000 mg | ORAL_TABLET | Freq: Two times a day (BID) | ORAL | Status: DC

## 2018-10-26 NOTE — Care Management (Signed)
3.  S/W  MARTHA  @  [employer]  # [phone] OPT- 2   TICAGRELOR : NONE FORMULARY  BRILINTA  90 MG BID  COVER- YES CO-PAY-  $ 550.00    TIER-  4 DRUG PRIOR APPROVAL - NO  DEDUCTIBLE : NOT MET  PREFERRED PHARMACY : YES [facility] 90 DAY SUPPLY FOR  M/O $ 300.00

## 2018-10-26 NOTE — Significant Event (Addendum)
Paged by RN @ 4:00 AM for increase work of breathing.   Assessed patient at bedside. BP [id number] systolic on arterial line with diastolic pressures 01K-553Z. On exam, bilateral rales appreciated. Lower extremities cool to the touch.   Given presumed systolic dysfunction in setting of large anterior STEMI ordered clevidipine for afterload reduction with goal SBP 110 - 120. Dosed additional 60mg  IV lasix for a total of 80mg  IV. Foley to be placed.  For increased work of breath and hypoxic respiratory failure (ABP 7.01/19/58) will placed on bipap while awaiting afterload reduction and diuresis to take effect.   Noted to have more ectopy on the lower dose of amiodarone. Will increase drip back to 60.   Given bicarb 18, cool extremities will check lactate and LFTs. Will order PICC to check co-oximetry.  Overall impression, low output SCAI B-C cardiogenic shock in the setting of recent anterior STEMI and increased afterload with associated hypoxemic respiratory failure secondary to acute pulmonary edema. Plan as above to diurese, afterload reduce, and assess end organ perfusion.   Lauren K. Marletta Lor, MD

## 2018-10-26 NOTE — Progress Notes (Addendum)
Principal Problem:   Acute ST elevation myocardial infarction (STEMI) involving left anterior descending (LAD) coronary artery (HCC) Active Problems:   Coronary artery disease involving coronary bypass graft of native heart with angina pectoris (HCC)   Coronary artery disease involving native coronary artery of native heart with unstable angina pectoris (HCC)   Cardiac arrest with ventricular fibrillation (HCC)   Acute pulmonary edema with congestive heart failure (HCC)   Acute on chronic combined systolic and diastolic CHF (congestive heart failure) (HCC)   Essential hypertension   Acute encephalopathy   Chronic kidney disease (CKD), stage III (moderate) (HCC)   Gout   History of TIAs   Type 2 diabetes mellitus with hyperlipidemia (HCC)   Accelerated hypertension with diastolic congestive heart failure, NYHA class 3 ([place])  Initially admitted with altered mental status but was noting palpitations without chest pain.  Upon arrival to him any pain, he was actually clear and then had another episode of confusion and subsequently developed VT.  Had sustained VT with cardiac arrest treated with CPR and defibrillation.  Was placed on amiodarone drip.  Had ROSC and awake and alert.  Subsequent EKG showed anterior ST elevations and he was [facility] cardiac catheter[facility].  Known severe native and graft disease with essentially patent large diagonal branch and LIMA-LAD.  Culprit lesion was 95% LAD after graft insertion.  This was treated with DES stent.  Post PCI was profoundly hypertensive and pulmonary edema placed on BiPAP and diuresis added.  Was also placed on calcium channel blocker drip (Cleviprex).  Currently blood pressures are in the 1 teens, he did not tolerate coming off of BiPAP this morning despite having good oxygenation and stable blood gas.  Rhythm is stabilized, there is question of possible atrial fibrillation, however the EKG this morning did not show A. fib, was  mostly short run of PAT followed by frequent PVCs but clear P waves noted.  Still not clear of the true etiology for his confusion, but was not thought to be TIA by neurology.  Would need to have neurology reevaluate.  Major concern now is the extent of his cardiomyopathy.  Check a 2D echocardiogram today to reassess up.  Was previously 52 to 45%.  I suspect that it is dropped now with this current event.   Exam notable for frequent ectopy but no obvious murmurs.  Lungs have coarse rhonchi and rales.  There is also a knocking sound at the end of expiration that is likely related to rib injury from CPR.  He has JVD with HJR (roughly 10 cm)  Plan:   Continue amiodarone at low-dose drip.  Will wean off Cleviprex and start hydralazine as well as carvedilol (in lieu of metoprolol since he is on amiodarone)  IV Lasix (already received a total of 60, 20, 20 mg with decent urine output, still remains with significant pulmonary on chest x-ray with BiPAP requirement --> we will give an additional 80 mg IV Lasix now with additional 40 in the evening.  Recheck chest x-ray in the evening.  Reevaluate diuretic in the morning.  On aspirin and Brilinta, I do not see A. fib, therefore I do not see a need for DOAC.  Statin  On metformin at home for diabetes, will place on sliding scale insulin here.  VT probably thought to be ischemic and therefore unless EF has reduced, would probably not consider ICD prior to discharge, however will discuss with the EP.  Consider the possibility of LifeVest (will  discuss with interventional colleagues).  Full note to follow  Patient is critically ill with multiple ongoing conditions with complex decision making.  Greater than 70 minutes was spent with the patient today in chart review and directly with the patient and patient care.  > 50 % of the time spent with direct patient care.  Glenetta Hew, MD

## 2018-10-26 NOTE — Progress Notes (Signed)
Upon arrival to patient room to perform AM assessment, patient was stating to RT that he was having trouble breathing as in the bipap mask was suffocating.  RN had drawn ABG prior to arrival and RT attempted to take patient off of bipap mask and place on nasal cannula.  After placing nasal cannula on patient, patient then stated that his breathing felt worse and that he was having a harder time breathing.  Placed patient back on bipap and adjusted settings for patient tolerance.  Will continue to monitor and wean as tolerated.

## 2018-10-26 NOTE — H&P (Addendum)
NAME:  [name] [name], MRN:  [id number], DOB:  Dec 07, 1931, LOS: 1 ADMISSION DATE:  10/08/2018, CONSULTATION DATE:  10/26/2018 REFERRING MD:  Dr. Ellyn Hack, CHIEF COMPLAINT:  Cardiogenic Shock/V. tach arrest  Brief History   [name] is an 85 year old gentleman with CAD status post CABG, PCI, carotid artery disease status post CEA, CKD stage III, ischemic cardiomyopathy (EF 40 to 45%) who initially presented with altered mental status however went into V. tach/V. fib arrest with post ROSC EKG showing ST elevation in anterior leads.  Underwent PCI with DES and found to be in cardiogenic shock with flash pulmonary edema and significantly reduced EF 15 to 20%.  History of present illness   [name] is an 85 year old gentleman with CAD status post CABG and PCI, carotid artery stenosis status post CEA, CKD stage III, ischemic cardiomyopathy (EF 40 to 45%), hypertension, hyperlipidemia, atrial fibrillation who initially presented to [facility] on 10/02/2018 with acute encephalopathy.  He was evaluated by neurology who did not think the etiology of his AMS was TIA or CVA.  Soon after admission he went into V. tach/V. fib arrest and attained ROSC after 1 minute of CPR and cardioversion.  Post resuscitation EKG revealed ST elevations in the anterior lateral leads and was urgently transported to [facility] for cardiac cardiac catheterization.  While in the lab he was found to have 95% occlusion of the LAD which was treated with DES stent.   Following cardiac catheterization, he was noted to be hypertensive with SBP 150-180s as well as flash pulmonary edema on CXR. He was placed on BiPAP and diuresis was started.  Repeat echocardiogram showed reduced EF of 15 to 20%.   Past Medical History  Coronary artery disease status post CABG, PCI Carotid artery disease status post CEA CKD stage III Ischemic cardiomyopathy (EF 40 to 45%) Hypertension New onset atrial fibrillation Chronic  gout Hyperlipidemia Type 2 diabetes mellitus Rheumatoid arthritis  Significant Hospital Events   1/29 admission to [facility] with AMS> 1/29 VT/V. tach arrest> 1/29 post arrest EKG with ST elevations in anterior lateral leads> 1/30 PCI with DES of LAD> 1/30 hypertensive, pulmonary edema, repeat echo with EF 15 to 20%> 1/30 PCCM consult>  Consults:  Interventional cardiology Advanced heart failure team  Procedures:  1/30 PCI with DES of LAD>  Significant Diagnostic Tests:  1/30 echocardiogram>  Micro Data:  None  Antimicrobials:  None  Objective   Blood pressure 129/67, pulse 76, temperature (!) 97.5 F (36.4 C), temperature source Axillary, resp. rate (!) 34, height 6\' 1"  (1.854 m), weight 74.9 kg, SpO2 100 %.    FiO2 (%):  [50 %-100 %] 50 %   Intake/Output Summary (Last 24 hours) at 10/26/2018 1332 Last data filed at 10/26/2018 1100 Gross per 24 hour  Intake 817.78 ml  Output 1300 ml  Net -482.22 ml   Filed Weights   10/02/2018 1350 10/09/2018 2215 10/26/18 0307  Weight: 77.9 kg 75.9 kg 74.9 kg    Examination: General: Elderly appearing gentleman, in mild distress secondary to tachypnea, speaks in full sentences HENT: BiPAP in place, normocephalic, atraumatic Lungs: Decreased inspiratory effort anteriorly, diffused crackles at the posterior lobes  Cardiovascular: RRR, no murmurs, gallops, rubs Abdomen: Bowel sounds present, nondistended, nontender to palpation Extremities: Foot is somewhat cool to touch bilaterally, decreased DP pulses Neuro: Alert and oriented x3 GU: Foley catheter in place  Resolved Hospital Problem list     Assessment & Plan:  [name] is  an 85 year old gentleman with CAD status post CABG, PCI, carotid artery disease status post CEA, CKD stage III, ischemic cardiomyopathy (EF 40 to 45%) who initially presented with altered mental status however went into V. tach/V. fib arrest with post ROSC EKG showing ST elevation in anterior  leads.  Underwent PCI with DES and found to be in cardiogenic shock with flash pulmonary edema and significantly reduced EF 15 to 20%  #Cardiogenic shock #STEMI s/p PCI with DES to mLAD His ventricular fibrillation ventricular tachycardia arrest was thought to be ischemic given his known history of severe CAD with previous invasive interventions. -Please appreciate cardiology and advance heart failure team recommendation -Continue low-dose amiodarone infusion -Continue IV Lasix -Continue Coreg -Continue aspirin -Start milrinone -Strict intake and output -Monitor urine output -Will require central venous line placement to monitor CVP, SVO2 -Follow-up BMP -Keep magnesium above 2.5 and potassium above 4  #Acute hypoxic respiratory failure -Secondary to cardiogenic shock with flash pulmonary edema -Continue BiPAP for now, if no improvement then will proceed with intubation.  #CAD s/p CABG, PCI -Status post PCI with DES 10/26/2018 -Continue aspirin, Brilinta, Lipitor  #?New onset atrial fibrillation - This was a concern during his initial presentation at [facility] though repeat EKGs has not shown no evidence of atrial fibrillation. - Cardiology plans to hold off on Duac  #Hypertension - BP stable -Hold off on antihypertensives  #Diabetes mellitus -Continue sliding scale insulin  #CKD stage III -Stable sCr 1.5 (baseline 1.6)  #Carotid artery stenosis status post CEA -Continue aspirin, Brilinta and Lipitor  #Hyperlipidemia -Continue Lipitor  #Rheumatoid arthritis -Continue Arava  #Chronic gout -Continue allopurinol   Best practice:  Diet: Heart healthy diet Pain/Anxiety/Delirium protocol (if indicated): None VAP protocol (if indicated): N/A DVT prophylaxis: SCDs GI prophylaxis: P.o. Protonix Glucose control: SSI Mobility: Bedrest Code Status: Full code Family Communication: Wife she will be at bedside Disposition: Remain in ICU  Labs   CBC: Recent  Labs  Lab 10/24/2018 1314 10/02/2018 2126 10/26/18 0327 10/26/18 0522 10/26/18 0824  WBC 6.6 7.0  --  10.1  --   NEUTROABS 5.0 4.2  --   --   --   HGB 11.3* 10.9* 10.5* 10.6* 9.9*  HCT 36.4* 35.5* 31.0* 34.4* 29.0*  MCV 88.3 89.6  --  87.5  --   PLT 214 288  --  288  --     Basic Metabolic Panel: Recent Labs  Lab 10/08/2018 1314 10/20/2018 2126 10/26/18 0219 10/26/18 0327 10/26/18 0522 10/26/18 0824  NA 136 134* 133* 134* 134* 135  K 3.1* 3.1* 3.2* 3.2* 3.2* 4.0  CL 102 100 100  --  101  --   CO2 22 21* 18*  --  19*  --   GLUCOSE 208* 189* 210*  --  232*  --   BUN 27* 24* 24*  --  24*  --   CREATININE 1.58* 1.55* 1.45*  --  1.52*  --   CALCIUM 8.3* 8.2* 8.1*  --  8.1*  --   MG 2.2 2.2 2.7*  --  2.5*  --    GFR: Estimated Creatinine Clearance: 38.3 mL/min (A) (by C-G formula based on SCr of 1.52 mg/dL (H)). Recent Labs  Lab 10/14/2018 1314 10/15/2018 2126 10/26/18 0513 10/26/18 0522 10/26/18 0802  WBC 6.6 7.0  --  10.1  --   LATICACIDVEN  --   --  1.9  --  1.8    Liver Function Tests: Recent Labs  Lab 10/06/2018 1314 10/04/2018  2126 10/26/18 0522  AST 34 95* 282*  ALT 16 63* 69*  ALKPHOS 83 81 83  BILITOT 1.0 0.6 1.2  PROT 6.0* 5.8* 5.6*  ALBUMIN 3.0* 2.9* 2.7*   No results for input(s): LIPASE, AMYLASE in the last 168 hours. No results for input(s): AMMONIA in the last 168 hours.  ABG    Component Value Date/Time   PHART 7.468 (H) 10/26/2018 0824   PCO2ART 31.0 (L) 10/26/2018 0824   PO2ART 164.0 (H) 10/26/2018 0824   HCO3 22.5 10/26/2018 0824   TCO2 23 10/26/2018 0824   ACIDBASEDEF 1.0 10/26/2018 0824   O2SAT 100.0 10/26/2018 0824     Coagulation Profile: Recent Labs  Lab 10/08/2018 1314  INR 1.08    Cardiac Enzymes: Recent Labs  Lab 10/16/2018 1314 10/01/2018 1539 10/09/2018 2126  TROPONINI 0.07* 0.19* 0.94*    HbA1C: Hgb A1c MFr Bld  Date/Time Value Ref Range Status  04/09/2009 11:35 PM  4.6 - 6.1 % Final   6.0 (NOTE) The ADA recommends the  following therapeutic goal for glycemic control related to Hgb A1c measurement: Goal of therapy: <6.5 Hgb A1c  Reference: American Diabetes Association: Clinical Practice Recommendations 2010, Diabetes Care, 2010, 33: (Suppl  1).    CBG: Recent Labs  Lab 10/26/18 1311  GLUCAP 187*    Review of Systems:   Review of Systems  HENT: Negative.   Eyes: Negative for blurred vision and double vision.  Respiratory: Positive for shortness of breath.   Cardiovascular: Positive for chest pain (Rib pain) and orthopnea. Negative for palpitations and leg swelling.  Gastrointestinal: Negative for abdominal pain, nausea and vomiting.  Genitourinary: Negative for dysuria and urgency.  Musculoskeletal: Negative for back pain.  Skin: Negative for itching and rash.  Neurological: Negative for dizziness and headaches.  Psychiatric/Behavioral: Negative.      Past Medical History  He,  has a past medical history of CAD (coronary artery disease), Carotid arterial disease ([place]), Chronic systolic CHF (congestive heart failure) ([place]), CKD (chronic kidney disease), stage III ([place]), Gout, History of TIAs (2000), HTN (hypertension), Hypercholesteremia, Ischemic cardiomyopathy, Prostate cancer ([place]), and Rheumatoid arthritis ([place]).   Surgical History    Past Surgical History:  Procedure Laterality Date  . BACK SURGERY    . CARDIOVASCULAR STRESS TEST  04-02-08   EF 30%  . CAROTID ENDARTERECTOMY  1992, 2007   LEFT, RIGHT  . CHOLECYSTECTOMY N/A 06/22/2013   Procedure: LAPAROSCOPIC CHOLECYSTECTOMY;  Surgeon: Gayland Curry, MD;  Location: [facility];  Service: General;  Laterality: N/A;  . CORONARY ARTERY BYPASS GRAFT  [id number]  . CORONARY/GRAFT ACUTE MI REVASCULARIZATION N/A 10/20/2018   Procedure: Coronary/Graft Acute MI Revascularization;  Surgeon: Troy Sine, MD;  Location: [place] [facility];  Service: Cardiovascular;  Laterality: N/A;  . KNEE ARTHROSCOPY     RIGHT KNEE  . LEFT HEART CATH AND CORONARY  ANGIOGRAPHY N/A 10/18/2018   Procedure: LEFT HEART CATH AND CORONARY ANGIOGRAPHY;  Surgeon: Troy Sine, MD;  Location: [place] [facility];  Service: Cardiovascular;  Laterality: N/A;  . RADIOACTIVE SEED IMPLANT  2005     Social History   reports that he quit smoking about 32 years ago. His smoking use included cigarettes. He has never used smokeless tobacco. He reports that he does not drink alcohol or use drugs.   Family History   His family history includes Cancer (age of onset: 47) in his mother; Other (age of onset: 66) in his father.   Allergies No Known  Allergies   Home Medications  Prior to Admission medications   Medication Sig Start Date End Date Taking? Authorizing Provider  allopurinol (ZYLOPRIM) 300 MG tablet TAKE 1 TABLET BY MOUTH EVERY DAY 10/31/15  Yes [place]ique, Peter M, MD  amLODipine (NORVASC) 10 MG tablet Take 10 mg by mouth daily.     Yes [provider]  aspirin 81 MG tablet Take 81 mg by mouth daily.     Yes [provider]  atorvastatin (LIPITOR) 40 MG tablet Take 40 mg by mouth 2 (two) times daily.    Yes [provider]  clopidogrel (PLAVIX) 75 MG tablet TAKE 1 TABLET BY MOUTH EVERY DAY 06/23/18  Yes [place]ique, Peter M, MD  folic acid (FOLVITE) 0.5 MG tablet Take 0.5 mg by mouth daily.   Yes [provider]  hydrALAZINE (APRESOLINE) 25 MG tablet TAKE 1 TABLET BY MOUTH THREE TIMES A DAY 06/22/18  Yes [place]ique, Peter M, MD  hydrochlorothiazide (MICROZIDE) 12.5 MG capsule Take 12.5 mg by mouth daily.   Yes [provider]  isosorbide mononitrate (IMDUR) 30 MG 24 hr tablet TAKE 1 TABLET BY MOUTH TWICE A DAY 08/14/18  Yes [place]ique, Peter M, MD  ketoconazole (NIZORAL) 2 % shampoo See admin instructions. 04/24/18  Yes [provider]  leflunomide (ARAVA) 10 MG tablet Take 10 mg by mouth daily. 03/29/18  Yes [provider]  metFORMIN (GLUCOPHAGE-XR) 500 MG 24 hr tablet TAKE 1 TABLET BY MOUTH EVERY DAY WITH EVENING MEAL  03/09/18  Yes [provider]  metoprolol tartrate (LOPRESSOR) 25 MG tablet Take 12.5 mg by mouth 2 (two) times daily.     Yes [provider]  azithromycin (ZITHROMAX) 250 MG tablet Take 1 tablet by mouth 2 (two) times daily. 08/25/18   [provider]  desonide (DESOWEN) 0.05 % lotion Apply 1 application topically as needed.  08/13/18   [provider]  ergocalciferol (VITAMIN D2) 50000 UNITS capsule Take 50,000 Units by mouth every 30 (thirty) days. Twice monthly 1st and 15th    [provider]  methotrexate (RHEUMATREX) 2.5 MG tablet Take 4 tablets by mouth once a week. On mondays 08/18/18   [provider]  nitroGLYCERIN (NITROSTAT) 0.4 MG SL tablet Place 1 tablet (0.4 mg total) under the tongue every 5 (five) minutes as needed. 06/25/16   [place]ique, Peter M, MD    Attending Note:  85 year old male with PMH of CAD presenting in acute heart failure.  C/O SOB on BiPAP.  Diffuse crackles noted.  I reviewed CXR myself, pulmonary edema noted.  Discussed with PCCM-NP.  Patient does not wish to be intubated.  Will maintain on BiPAP.  Active diureses.  Place TLC and follow CVP and coox.  If patient's kidneys are to fail patient is accepting of dialysis.  Advanced heart failure team consulted.  Titrate BiPAP for comfort.  PCCM will continue to follow.  The patient is critically ill with multiple organ systems failure and requires high complexity decision making for assessment and support, frequent evaluation and titration of therapies, application of advanced monitoring technologies and extensive interpretation of multiple databases.   Critical Care Time devoted to patient care services described in this note is  45  Minutes. This time reflects time of care of this signee Dr [name]. This critical care time does not reflect procedure time, or teaching time or supervisory time of PA/NP/Med student/Med Resident etc but could involve care discussion  time.  Rush Farmer, M.D. [facility]  Pulmonary/Critical Care Medicine. Pager: [phone]. After hours pager: [phone].

## 2018-10-26 NOTE — Progress Notes (Signed)
Primary RN to reassess the need for PICC placement and update IV consult if still needed. Will follow up.

## 2018-10-26 NOTE — Progress Notes (Signed)
Code blue called to rm 338. Upon arrivng, pt being bagged and cpr in progress. Pt gray in color.  Pads placed and pt in vtach, delivered one shock and then pt back into bradycardia. tranferred to icu

## 2018-10-26 NOTE — Progress Notes (Signed)
Sheath Pull  Site right groin,  Site assessment prior to removal "0",  Manual pressure applied for 20 minutes.  Patient stable during procedure, VSS, see flowsheets. Post sheath groin assessment "0".  Patient instructed to apply direct pressure to site when coughing, laughing, or sneezing and to keep head on pillow until bed rest is completed.  Distal pulses +1 post pull.  Pt tolerated procedure well.    Karsten Ro, RN

## 2018-10-26 NOTE — Progress Notes (Signed)
  Echocardiogram 2D Echocardiogram has been performed.  [name] [name] 10/26/2018, 12:48 PM

## 2018-10-26 NOTE — Progress Notes (Signed)
RT obtained ABG with the following results for pt on BIPAP 16/8 and 60% FIO2.  Informed CCM of pts increased WOB and PCO2 of 27.1. RT will continue to monitor.     Ref. Range 10/26/2018 20:41  Sample type Unknown ARTERIAL  pH, Arterial Latest Ref Range: 7.350 - 7.450  7.471 (H)  pCO2 arterial Latest Ref Range: 32.0 - 48.0 mmHg 27.1 (L)  pO2, Arterial Latest Ref Range: 83.0 - 108.0 mmHg 77.0 (L)  TCO2 Latest Ref Range: 22 - 32 mmol/L 21 (L)  Acid-base deficit Latest Ref Range: 0.0 - 2.0 mmol/L 3.0 (H)  Bicarbonate Latest Ref Range: 20.0 - 28.0 mmol/L 19.8 (L)  O2 Saturation Latest Units: % 96.0  Patient temperature Unknown 98.6 F

## 2018-10-26 NOTE — Care Management (Signed)
Brilinta benefits check sent and pending.  Ellias Mcelreath RN, BSN, NCM-BC, ACM-RN [phone] 

## 2018-10-26 NOTE — Consult Note (Addendum)
Advanced Heart Failure Team Consult Note   Primary Physician: Merrilee Seashore, MD PCP-Cardiologist:  No primary care provider on file.  Reason for Consultation: cardiogenic shock  HPI:    [name] [name] is seen today for evaluation of cardiogenic shock at the request of Dr. Wilhemina Cash.   [name] [name] is a 85 y.o. male with CAD s/p CABG (1989 with redo in 2007) and PCI (2010) [as of 2010 LIMA -> LAD patent, SVG to Ramus, PDA occluded, stent in native diagonal and in SVG -> diag patent], carotid disease s/p CEA, CKD III (Cr 1.5), ICM (EF 40-45%), and HTN.  He presents to [facility]01/2018 with AMS and complaints of episodes where his vision would go "pink" and his heart rate would drop. He had syncopal episode with loss of bladder and bowel. EMS called. He has had similar episodes in the past due to TIA, and twice in the week leading up to his admit. In ED, had returned to baseline mental status. Neurology saw and did not feel as if CVA or TIA. Initial EKG with ? Afib and troponin mildly up at 0.07 but no CP. Started on heparin gtt and moved to floor. Pt subsequently had VT/VF arrest requiring 1 min of ACLS and 1 shock before return of pulse. Post-ROSC EKG showed STE in anterior leads. Taken emergently to cath lab which showed known severe native and graft disease with essentially patent large diagonal branch and LIMA-LAD. Culprit lesion was 95% LAD after graft insertion. Treated with DES stent as below.   Pertinent labs on admission include Cr 1.58, K 3.1, Troponin peaked 0.94, WBC 10.1, Hgb 9.9, Bicarb 24.  ABG this am pH 7.468, pCO2 31, pO2 164, Bicarb 22.5.  CHF team asked to see with suspected cardiogenic shock.   On BiPAP currently with RR in 30-34. He is awake and alert. Wife and son in room. She states he has had gradual decline over the past year or so, most past 3-4 months, possibly even 1-2 years,  having to stop and take a break while doing yard work. He usually does not  have SOB with his ADLs. Family and patient felt this was exacerbated by URI in November. He has received 60 mg IV lasix at 0400 and 80 mg IV lasix at 0900 with only around 300 cc of UOP. Discussed with pt, wife, and son. They would be OK with intubation if his breathing continues to worsen. Denies CP.   Echo 10/26/2018 15-20% preliminary per Dr. Ellyn Hack.   LHC 10/26/2018  Prox RCA to Mid RCA lesion is 100% stenosed.  Ost Cx to Prox Cx lesion is 100% stenosed.  Ost 1st Diag to 1st Diag lesion is 5% stenosed.  Prox LAD lesion is 60% stenosed.  Mid LAD-1 lesion is 80% stenosed.  Mid LAD-2 lesion is 90% stenosed.  Post intervention, there is a 0% residual stenosis in LAD.   A stent was successfully placed to LAD.   ETT 01/2016 (Thought to be consistent with his known disease and no further work up pursued) 1.  There was 3 mm horizontal ST depression inferiorly and 2 mm horizontal ST depression V4/V5 during exercise.  2.  This is concerning for ischemia. 3.  No chest pain.   Review of Systems: [y] = yes, [ ]  = no   General: Weight gain [ ] ; Weight loss [ ] ; Anorexia [ ] ; Fatigue [y]; Fever [ ] ; Chills [ ] ; Weakness [y]  Cardiac: Chest pain/pressure [  y]; Resting SOB [y]; Exertional SOB [y]; Orthopnea [y]; Pedal Edema [ ] ; Palpitations [ ] ; Syncope [ ] ; Presyncope [ ] ; Paroxysmal nocturnal dyspnea[ ]   Pulmonary: Cough [y]; Wheezing[ ] ; Hemoptysis[ ] ; Sputum [ ] ; Snoring [ ]   GI: Vomiting[ ] ; Dysphagia[ ] ; Melena[ ] ; Hematochezia [ ] ; Heartburn[ ] ; Abdominal pain [ ] ; Constipation [ ] ; Diarrhea [ ] ; BRBPR [ ]   GU: Hematuria[ ] ; Dysuria [ ] ; Nocturia[ ]   Vascular: Pain in legs with walking [ ] ; Pain in feet with lying flat [ ] ; Non-healing sores [ ] ; Stroke [ ] ; TIA [ ] ; Slurred speech [ ] ;  Neuro: Headaches[ ] ; Vertigo[ ] ; Seizures[ ] ; Paresthesias[ ] ;Blurred vision [ ] ; Diplopia [ ] ; Vision changes [ ]   Ortho/Skin: Arthritis [y]; Joint pain [y]; Muscle pain [ ] ; Joint swelling [ ] ; Back  Pain [ ] ; Rash [ ]   Psych: Depression[ ] ; Anxiety[ ]   Heme: Bleeding problems [ ] ; Clotting disorders [ ] ; Anemia [ ]   Endocrine: Diabetes [ ] ; Thyroid dysfunction[ ]   Home Medications Prior to Admission medications   Medication Sig Start Date End Date Taking? Authorizing Provider  allopurinol (ZYLOPRIM) 300 MG tablet TAKE 1 TABLET BY MOUTH EVERY DAY 10/31/15  Yes Martinique, Peter M, MD  amLODipine (NORVASC) 10 MG tablet Take 10 mg by mouth daily.     Yes [provider]  aspirin 81 MG tablet Take 81 mg by mouth daily.     Yes [provider]  atorvastatin (LIPITOR) 40 MG tablet Take 40 mg by mouth 2 (two) times daily.    Yes [provider]  clopidogrel (PLAVIX) 75 MG tablet TAKE 1 TABLET BY MOUTH EVERY DAY 06/23/18  Yes Martinique, Peter M, MD  folic acid (FOLVITE) 0.5 MG tablet Take 0.5 mg by mouth daily.   Yes [provider]  hydrALAZINE (APRESOLINE) 25 MG tablet TAKE 1 TABLET BY MOUTH THREE TIMES A DAY 06/22/18  Yes Martinique, Peter M, MD  hydrochlorothiazide (MICROZIDE) 12.5 MG capsule Take 12.5 mg by mouth daily.   Yes [provider]  isosorbide mononitrate (IMDUR) 30 MG 24 hr tablet TAKE 1 TABLET BY MOUTH TWICE A DAY 08/14/18  Yes Martinique, Peter M, MD  ketoconazole (NIZORAL) 2 % shampoo See admin instructions. 04/24/18  Yes [provider]  leflunomide (ARAVA) 10 MG tablet Take 10 mg by mouth daily. 03/29/18  Yes [provider]  metFORMIN (GLUCOPHAGE-XR) 500 MG 24 hr tablet TAKE 1 TABLET BY MOUTH EVERY DAY WITH EVENING MEAL 03/09/18  Yes [provider]  metoprolol tartrate (LOPRESSOR) 25 MG tablet Take 12.5 mg by mouth 2 (two) times daily.     Yes [provider]  azithromycin (ZITHROMAX) 250 MG tablet Take 1 tablet by mouth 2 (two) times daily. 08/25/18   [provider]  desonide (DESOWEN) 0.05 % lotion Apply 1 application topically as needed.  08/13/18   [provider]  ergocalciferol (VITAMIN D2)  50000 UNITS capsule Take 50,000 Units by mouth every 30 (thirty) days. Twice monthly 1st and 15th    [provider]  methotrexate (RHEUMATREX) 2.5 MG tablet Take 4 tablets by mouth once a week. On mondays 08/18/18   [provider]  nitroGLYCERIN (NITROSTAT) 0.4 MG SL tablet Place 1 tablet (0.4 mg total) under the tongue every 5 (five) minutes as needed. 06/25/16   Martinique, Peter M, MD   Past Medical History: Past Medical History:  Diagnosis Date  . CAD (coronary artery disease)    a. s/p  MI in 1988 and CABG 1989;  b. s/p redo CABG 2007;  c. s/p PCI DES to VG->PDA and native Diag;  d. 2010 Cath: occluded VG's to RI and PDA, patent LIMA->LAD, patent stent in diag->med Rx.  . Carotid arterial disease ([place])    a. s/p bilat patch angioplasties;  b. 0/6269 RICA 4-85%, LICA 46-27%  . Chronic systolic CHF (congestive heart failure) ([place])    a. 03/2008 EF 30% noted on Myoview.  . CKD (chronic kidney disease), stage III ([place])   . Gout   . History of TIAs 2000  . HTN (hypertension)   . Hypercholesteremia   . Ischemic cardiomyopathy    a. 03/2008 EF 30% noted on Myoview.  . Prostate cancer ([place])   . Rheumatoid arthritis [facility])    Past Surgical History: Past Surgical History:  Procedure Laterality Date  . BACK SURGERY    . CARDIOVASCULAR STRESS TEST  04-02-08   EF 30%  . CAROTID ENDARTERECTOMY  1992, 2007   LEFT, RIGHT  . CHOLECYSTECTOMY N/A 06/22/2013   Procedure: LAPAROSCOPIC CHOLECYSTECTOMY;  Surgeon: Gayland Curry, MD;  Location: [facility];  Service: General;  Laterality: N/A;  . CORONARY ARTERY BYPASS GRAFT  [id number]  . CORONARY/GRAFT ACUTE MI REVASCULARIZATION N/A 10/02/2018   Procedure: Coronary/Graft Acute MI Revascularization;  Surgeon: Troy Sine, MD;  Location: [place] CV [facility];  Service: Cardiovascular;  Laterality: N/A;  . KNEE ARTHROSCOPY     RIGHT KNEE  . LEFT HEART CATH AND CORONARY ANGIOGRAPHY N/A 10/03/2018   Procedure: LEFT HEART CATH AND CORONARY  ANGIOGRAPHY;  Surgeon: Troy Sine, MD;  Location: [facility];  Service: Cardiovascular;  Laterality: N/A;  . RADIOACTIVE SEED IMPLANT  2005   Family History: Family History  Problem Relation Age of Onset  . Other Father 56       CEREBRAL HEMORRHAGE  . Cancer Mother 93   Social History: Social History   Socioeconomic History  . Marital status: Married    Spouse name: Not on file  . Number of children: 3  . Years of education: Not on file  . Highest education level: Not on file  Occupational History  . 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 Financial resource strain: Not on file  . Food insecurity:    Worry: Not on file    Inability: Not on file  . Transportation needs:    Medical: Not on file    Non-medical: Not on file  Tobacco Use  . Smoking status: Former Smoker    Types: Cigarettes    Last attempt to quit: 09/27/1986    Years since quitting: 32.1  . Smokeless tobacco: Never Used  Substance and Sexual Activity  . Alcohol use: No  . Drug use: No  . Sexual activity: Not on file  Lifestyle  . Physical activity:    Days per week: Not on file    Minutes per session: Not on file  . Stress: Not on file  Relationships  . Social connections:    Talks on phone: Not on file    Gets together: Not on file    Attends religious service: Not on file    Active member of club or organization: Not on file    Attends meetings of clubs or organizations: Not on file    Relationship status: Not on file  Other Topics Concern  . Not on file  Social History Narrative   Lives in  [place] with wife.  Works at a [employer] two days/wk and does water walking 3 days/wk x 30-45 mins per session.   Allergies:  No Known Allergies  Objective:    Vital Signs:   Temp:  [97.5 F (36.4 C)-98 F (36.7 C)] 97.5 F (36.4 C) (01/30 1140) Pulse Rate:  [35-101] 76 (01/30 1155) Resp:  [11-39] 34 (01/30 1155) BP: (94-163)/(49-93) 129/67  (01/30 1155) SpO2:  [83 %-100 %] 100 % (01/30 1155) Arterial Line BP: (105-190)/(49-80) 124/55 (01/30 0800) FiO2 (%):  [50 %-100 %] 50 % (01/30 1155) Weight:  [74.9 kg-77.9 kg] 74.9 kg (01/30 0307) Last BM Date: 09/28/18  Weight change: Filed Weights   09/30/2018 1350 10/17/2018 2215 10/26/18 0307  Weight: 77.9 kg 75.9 kg 74.9 kg   Intake/Output:   Intake/Output Summary (Last 24 hours) at 10/26/2018 1251 Last data filed at 10/26/2018 1100 Gross per 24 hour  Intake 817.78 ml  Output 1300 ml  Net -482.22 ml    Physical Exam    General:  Elderly. Acutely ill appearing. On BiPAP.  HEENT: BiPAP Neck: supple. JVP ~8-9 cm. Carotids 2+ bilat; no bruits. No lymphadenopathy or thyromegaly appreciated. Cor: PMI nondisplaced. Somewhat irregular, ? Due to ectopy. Rate OK. No rubs, gallops or murmurs. Lungs: Diminished throughout with scattered rhonchi.  Abdomen: soft, nontender, nondistended. No hepatosplenomegaly. No bruits or masses. Good bowel sounds. Extremities: no cyanosis, clubbing, or rash. Legs somewhat cool.  Neuro: alert & orientedx3, cranial nerves grossly intact. moves all 4 extremities w/o difficulty. Affect flat.   Telemetry   Appears to be in NSR 60-70s with PVCs, personally reviewed.   EKG    0706 10/26/2018 NSR (? p waves) 72 bpm, with PVCs, personally reviewed. EKG read as afib, but p waves apparent. Personally reviewed.   Labs   Basic Metabolic Panel: Recent Labs  Lab 10/12/2018 1314 09/29/2018 2126 10/26/18 0219 10/26/18 0327 10/26/18 0522 10/26/18 0824  NA 136 134* 133* 134* 134* 135  K 3.1* 3.1* 3.2* 3.2* 3.2* 4.0  CL 102 100 100  --  101  --   CO2 22 21* 18*  --  19*  --   GLUCOSE 208* 189* 210*  --  232*  --   BUN 27* 24* 24*  --  24*  --   CREATININE 1.58* 1.55* 1.45*  --  1.52*  --   CALCIUM 8.3* 8.2* 8.1*  --  8.1*  --   MG 2.2 2.2 2.7*  --  2.5*  --    Liver Function Tests: Recent Labs  Lab 09/30/2018 1314 10/10/2018 2126 10/26/18 0522  AST 34 95*  282*  ALT 16 63* 69*  ALKPHOS 83 81 83  BILITOT 1.0 0.6 1.2  PROT 6.0* 5.8* 5.6*  ALBUMIN 3.0* 2.9* 2.7*   No results for input(s): LIPASE, AMYLASE in the last 168 hours. No results for input(s): AMMONIA in the last 168 hours.  CBC: Recent Labs  Lab 10/24/2018 1314 10/16/2018 2126 10/26/18 0327 10/26/18 0522 10/26/18 0824  WBC 6.6 7.0  --  10.1  --   NEUTROABS 5.0 4.2  --   --   --   HGB 11.3* 10.9* 10.5* 10.6* 9.9*  HCT 36.4* 35.5* 31.0* 34.4* 29.0*  MCV 88.3 89.6  --  87.5  --   PLT 214 288  --  288  --    Cardiac Enzymes: Recent Labs  Lab 10/11/2018 1314 09/29/2018 1539 10/09/2018 2126  TROPONINI 0.07* 0.19* 0.94*   BNP: BNP (last  3 results) No results for input(s): BNP in the last 8760 hours.  ProBNP (last 3 results) No results for input(s): PROBNP in the last 8760 hours.  CBG: No results for input(s): GLUCAP in the last 168 hours.  Coagulation Studies: Recent Labs    10/22/2018 1314  LABPROT 13.9  INR 1.08   Imaging   Mr Brain Wo Contrast  Result Date: 10/08/2018 CLINICAL DATA:  Altered level of consciousness. Prostate cancer history. EXAM: MRI HEAD WITHOUT CONTRAST TECHNIQUE: Multiplanar, multiecho pulse sequences of the brain and surrounding structures were obtained without intravenous contrast. COMPARISON:  CT head 10/21/2018 FINDINGS: Brain: Moderate atrophy. Chronic hemorrhagic infarction left basal ganglia/external capsule. Mild chronic microvascular ischemic change elsewhere in the white matter. Negative for acute infarct. Negative for mass or edema. No midline shift. Vascular: Normal arterial flow void Skull and upper cervical spine: Negative Sinuses/Orbits: Moderate mucosal edema throughout the paranasal sinuses. No orbital mass. Left cataract surgery Other: None IMPRESSION: Atrophy and chronic ischemic changes as above. Chronic hemorrhagic infarction left lateral basal ganglia. No acute abnormality. Electronically Signed   By: Franchot Gallo M.D.   On:  09/27/2018 16:52   US Carotid Bilateral (at [facility] Only)  Result Date: 10/27/2018 CLINICAL DATA:  Altered mental status, ataxic, hyperlipidemia EXAM: BILATERAL CAROTID DUPLEX ULTRASOUND TECHNIQUE: Pearline Cables scale imaging, color Doppler and duplex ultrasound were performed of bilateral carotid and vertebral arteries in the neck. COMPARISON:  10/03/2018 head CT FINDINGS: Criteria: Quantification of carotid stenosis is based on velocity parameters that correlate the residual internal carotid diameter with NASCET-based stenosis levels, using the diameter of the distal internal carotid lumen as the denominator for stenosis measurement. The following velocity measurements were obtained: RIGHT ICA: 71/18 cm/sec CCA: 29/79 cm/sec SYSTOLIC ICA/CCA RATIO:  0.9 ECA: 127 cm/sec LEFT ICA: 105/24 cm/sec CCA: 892/11 cm/sec SYSTOLIC ICA/CCA RATIO:  0.8 ECA: 160 cm/sec RIGHT CAROTID ARTERY: Moderate intimal thickening and diffuse carotid atherosclerosis. No hemodynamically significant right ICA stenosis, velocity elevation, or turbulent flow. Degree of narrowing less than 50%. RIGHT VERTEBRAL ARTERY:  Antegrade LEFT CAROTID ARTERY: Similar scattered moderate intimal thickening and diffuse carotid atherosclerosis. No hemodynamically significant left ICA stenosis, velocity elevation, or turbulent flow. LEFT VERTEBRAL ARTERY:  Antegrade IMPRESSION: Moderate carotid atherosclerosis. No hemodynamically significant ICA stenosis. Degree of narrowing less than 50% bilaterally by ultrasound criteria. Patent antegrade vertebral flow bilaterally Electronically Signed   By: Jerilynn Mages.  Shick M.D.   On: 10/24/2018 16:17   Dg Chest Port 1 View  Result Date: 10/26/2018 CLINICAL DATA:  Dyspnea EXAM: PORTABLE CHEST 1 VIEW COMPARISON:  Yesterday FINDINGS: Bilateral symmetric perihilar airspace disease. Chronic cardiomegaly. Status post CABG. No effusion or pneumothorax. Stable aortic and hilar contours. IMPRESSION: Pulmonary edema. Electronically  Signed   By: Monte Fantasia M.D.   On: 10/26/2018 05:15   Dg Chest Port 1 View  Result Date: 10/03/2018 CLINICAL DATA:  Chest pain. EXAM: PORTABLE CHEST 1 VIEW COMPARISON:  Frontal and lateral views 09/14/2018 FINDINGS: Post median sternotomy and CABG. Mild cardiomegaly. Diffuse interstitial opacities most prominent in the perihilar and suprahilar regions. No confluent airspace disease. No evidence of pleural effusion or pneumothorax. No acute osseous abnormalities IMPRESSION: Mild cardiomegaly, new from exam last month. New diffuse interstitial opacities most prominent in the perihilar and suprahilar regions, favor pulmonary edema over atypical infection. Electronically Signed   By: Keith Rake M.D.   On: 10/08/2018 22:07   Ct Head Code Stroke Wo Contrast  Result Date: 10/18/2018 CLINICAL DATA:  Code stroke.  Ataxia.  Syncope. EXAM: CT HEAD WITHOUT CONTRAST TECHNIQUE: Contiguous axial images were obtained from the base of the skull through the vertex without intravenous contrast. COMPARISON:  10/14/2006 FINDINGS: Brain: There is no evidence of acute infarct, intracranial hemorrhage, mass, midline shift, or extra-axial fluid collection. Mildly prominent, symmetric extra-axial CSF spaces over both frontal convexities are favored to be secondary to progressive cerebral atrophy rather than reflecting subdural hygromas. A chronic infarct is again noted involving the left basal ganglia and external capsule. Scattered hypodensities elsewhere in the cerebral white matter bilaterally have mildly progressed from the prior study and are nonspecific but compatible with mild chronic small vessel ischemic disease. Vascular: Calcified atherosclerosis at the skull base. No hyperdense vessel. Skull: No fracture or focal osseous lesion. Sinuses/Orbits: Near complete opacification of the right sphenoid sinus, likely by combination of fluid and mucosal thickening and with sclerosis and thickening of the sinus walls  indicating an element of chronicity. Moderate bilateral ethmoid air cell mucosal thickening. Small volume fluid in the frontal sinuses. Clear mastoid air cells. Left cataract extraction. Other: None. ASPECTS [facility] Stroke Program Early CT Score) Not scored with this history. IMPRESSION: 1. No evidence of acute intracranial abnormality. 2. Chronic left basal ganglia/external capsule infarct. 3. Mildly progressive cerebral atrophy and chronic small vessel ischemic disease. These results were called by telephone at the time of interpretation on 10/22/2018 at 1:33 pm to Dr. Tomi Bamberger, who verbally acknowledged these results. Electronically Signed   By: Logan Bores M.D.   On: 10/15/2018 13:33   [place] Ekg Site Rite  Result Date: 10/26/2018 If Site Rite image not attached, placement could not be confirmed due to current cardiac rhythm.    Medications:    Current Medications: . allopurinol  300 mg Oral Daily  . aspirin  81 mg Oral Daily  . atorvastatin  80 mg Oral q1800  . carvedilol  3.125 mg Oral BID WC  . folic acid  0.5 mg Oral Daily  . furosemide  40 mg Intravenous Once  . insulin aspart  0-9 Units Subcutaneous TID WC  . leflunomide  10 mg Oral Daily  . [START ON 10/30/2018] methotrexate  10 mg Oral Weekly  . pantoprazole  40 mg Oral Daily  . perflutren lipid microspheres (DEFINITY) IV suspension      . potassium chloride  40 mEq Oral Q4H  . sodium chloride flush  3 mL Intravenous Q12H  . ticagrelor  90 mg Oral BID     Infusions: . sodium chloride 75 mL/hr at 10/26/18 0351  . sodium chloride    . amiodarone 30 mg/hr (10/26/18 0900)  . milrinone    . potassium chloride 10 mEq (10/26/18 1234)     Patient Profile   [name] [name] is a 85 y.o. male  with CAD s/p CABG (1989 with redo in 2007) and PCI (2010) [as of 2010 LIMA -> LAD patent, SVG to Ramus, PDA occluded, stent in native diagonal and in SVG -> diag patent], carotid disease s/p CEA, CKD III (Cr 1.5), ICM (EF 40-45%), and HTN.    Presented to [facility] 10/10/2018 with AMS (resolved) and subsequently suffered a VT/VF arrest with post-ROSC EKG demonstrative of STE in the anterior leads.  Assessment/Plan   1. ? STEMI c/b VT/VF arrest - s/p DES to LAD as above. - Remains on Bival, ASA, and Plavix - Pt states he would want Defib again if he needs it.   2. CAD s/p CABG x 2 - Cath 10/26/2018 showed known  severe native and graft disease with essentially patent large diagonal branch and LIMA-LAD. Culprit lesion was 95% LAD after graft insertion. Treated with DES stent as below. - Cath reviewed by Dr. Haroldine Laws, lesion appears out of proportion to his degree of HF.   3. Acute on chronic systolic CHF; Primarily ischemic, but ? NICM component with gradual worsening over the past 1-2 years.  - In setting of STEMI as above - Echo shows EF down to 15%. Per Dr. Haroldine Laws, appears out of proportion to his CAD.  - He remains orthopneic and on BiPAP. Will attempt additional diuresis. Will discuss dosing with MD.  - Continue low dose coreg 3.125 mg BID for now post stemi. If coox low, should stop.  - Have asked CCM to place central access.  - Milrinone 0.25 mcg/kg/min started. Will check Coox and CVP once central access placed, to help guide.   4. Acute hypoxic respiratory failure - On BiPAP. Pt would only want intubation as a last resort.   5. AMS - He is currently awake and alert.  - MRI brain without acute abnormality - Carotid ultrasound without flow limiting stenosis - CT head negative - Suspect was in the context arrhythmia as mentioned above.  - Can consider EEG if neurology feels it is warranted, but he appears stable at this time.   6. ? Afib, which would be new onset - Reported, but difficult to discern due to ectopy.  - Plan on restarting heparin once off bival and after sheath pull - CHA2DS2/VASc is at least 8.  - PPI while on triple therapy.    7. HTN - Holding home meds in setting of shock.   8. DM2 - SSI.    9. Hypokalemia - K 3.2 this am, up to 4.0 with supp. Continue to follow closely with diuresis.   Patient is critically ill and in danger of multiple system organ failure.  Prognosis guarded. Long discussion about goals of care from Dr. Haroldine Laws to patient and family. His input to follow.   Medication concerns reviewed with patient and pharmacy team. Barriers identified: None at this time.   Length of Stay: 1  Annamaria Helling  10/26/2018, 12:51 PM  Advanced Heart Failure Team Pager [phone] (M-F; 7a - 4p)  Please contact [facility] for night-coverage after hours (4p -7a ) and weekends on [email or web address]  Agree with above.   85 y/o male with CAD s/p re-do CABG. Previous EF 45% in 2014. Has had progressive lethargy and dyspnea for over a year. Admitted with AMS and VT arrest. Cath showed moderate to severe lesion in distal LAD after LIMA. Trop 0.9. Echo with EF 15%. Currently with respiratory distress on bipap. Not diuresis despite milrinone.   On exam On bipap dyspneic CVP 10-12 Cor RRR + S3 Lungs crackles Ab + distended Ext cool no edema  Suspect the main issues here is progressive HF with primary VT arrest. Doubt NSTEMI is culprit as EF down far out of proportion to CAD. Long talk with him and his family about prognosis and the fact that his heart may not get better even with aggressive care. He is ok with trial of inotropes but would not want to be on ventilator. He would consider short course of CVVHD if needed. But I do not think this is viable option. Agree with placement of central line to measure CVP and co-ox to further guide therapy.   D/w CCM.   Glori Bickers, MD  3:57 PM

## 2018-10-26 NOTE — Procedures (Addendum)
Central Venous Catheter Insertion Procedure Note [name] [name] [id number] 1932-03-18  Procedure: Insertion of Central Venous Catheter Indications: Assessment of intravascular volume, Drug and/or fluid administration and Frequent blood sampling  Procedure Details Consent: Risks of procedure as well as the alternatives and risks of each were explained to the (patient/caregiver).  Consent for procedure obtained. Time Out: Verified patient identification, verified procedure, site/side was marked, verified correct patient position, special equipment/implants available, medications/allergies/relevent history reviewed, required imaging and test results available.  Performed  Maximum sterile technique was used including antiseptics, cap, gloves, gown, hand hygiene, mask and sheet. Skin prep: Chlorhexidine; local anesthetic administered A antimicrobial bonded/coated triple lumen catheter was placed in the left internal jugular vein using the Seldinger technique.  Evaluation Blood flow good Complications: No apparent complications Patient did tolerate procedure well. Chest X-ray ordered to verify placement.  CXR: pending.  [name] [name] 10/26/2018, 3:39 PM  I was present and supervised the procedure.  Rush Farmer, M.D. [facility]. Pager: [phone]. After hours pager: [phone].

## 2018-10-26 NOTE — Progress Notes (Signed)
Progress Note  Patient Name: [name] [name] Date of Encounter: 10/26/2018  Primary Cardiologist: No primary care provider on file. Dr. Martinique  Subjective   No more chest pain just hurting from chest compressions.  Is mostly noting significant dyspnea despite being on BiPAP.  Unable to wean BiPAP.  Inpatient Medications    Scheduled Meds: . allopurinol  300 mg Oral Daily  . aspirin  81 mg Oral Daily  . atorvastatin  80 mg Oral q1800  . carvedilol  3.125 mg Oral BID WC  . folic acid  0.5 mg Oral Daily  . furosemide  40 mg Intravenous Once  . insulin aspart  0-9 Units Subcutaneous TID WC  . leflunomide  10 mg Oral Daily  . [START ON 10/30/2018] methotrexate  10 mg Oral Weekly  . pantoprazole  40 mg Oral Daily  . perflutren lipid microspheres (DEFINITY) IV suspension      . potassium chloride  40 mEq Oral Q4H  . sodium chloride flush  3 mL Intravenous Q12H  . ticagrelor  90 mg Oral BID   Continuous Infusions: . sodium chloride 75 mL/hr at 10/26/18 0351  . sodium chloride    . amiodarone 30 mg/hr (10/26/18 0900)  . milrinone    . potassium chloride 10 mEq (10/26/18 1234)   PRN Meds: sodium chloride, acetaminophen, LORazepam, ondansetron (ZOFRAN) IV, perflutren lipid microspheres (DEFINITY) IV suspension, sodium chloride flush   Vital Signs    Vitals:   10/26/18 0900 10/26/18 1000 10/26/18 1140 10/26/18 1155  BP: (!) 106/58 119/66  129/67  Pulse: (!) 59 (!) 58  76  Resp: (!) 30 (!) 35  (!) 34  Temp:   (!) 97.5 F (36.4 C)   TempSrc:   Axillary   SpO2: 92% 98%  100%  Weight:      Height:        Intake/Output Summary (Last 24 hours) at 10/26/2018 1238 Last data filed at 10/26/2018 1100 Gross per 24 hour  Intake 817.78 ml  Output 1300 ml  Net -482.22 ml   Last 3 Weights 10/26/2018 10/27/2018 10/03/2018  Weight (lbs) 165 lb 2 oz 167 lb 5.3 oz 171 lb 11.8 oz  Weight (kg) 74.9 kg 75.9 kg 77.9 kg      Telemetry    Sinus rhythm with frequent PACs and PVCs.  Some  brief runs of PAT and ventricular bigeminy.  No further VT.  Rates mostly in the 70s to 4s.- Personally Reviewed  ECG    Sinus rhythm (not atrial fibrillation.  There is a brief run of PAT followed by PVC and then normal beat plus PVC and then several sinus beats.  Evolutionary changes of anterior MI noted with biphasic ST segment elevation with T wave inversions in V4 V5 V6 with more persistent ST elevations in V2 and V3 with Q waves in V1 and V2..- Personally Reviewed  Physical Exam   Physical Exam  Constitutional: He is oriented to person, place, and time. He appears well-developed.  Moderate respiratory distress despite being on BiPAP  HENT:  Head: Normocephalic and atraumatic.  Neck: Normal range of motion. Neck supple. JVD (10 cmH2O) present.  Cardiovascular: Normal rate and intact distal pulses. An irregular rhythm present. Frequent extrasystoles are present. PMI is not displaced. Exam reveals distant heart sounds.  Relatively normal S1 and 2 with possible gallop versus click.  There is also a loud popping noise with deep inspiration that sounds like it may be rib sounds.  Very difficult  to hear heart sounds.  No obvious M/R/G  Pulmonary/Chest: Effort normal. No respiratory distress (Just says his difficulty breathing and little proximal-isms.). He exhibits tenderness (To palpation along the sternal border.).  Diffuse coarse bibasal rhonchi and rales.  Abdominal: Soft. Bowel sounds are normal. He exhibits no distension. There is no abdominal tenderness.  Musculoskeletal: Normal range of motion.        General: No edema.  Neurological: He is alert and oriented to person, place, and time.  Psychiatric: He has a normal mood and affect. His behavior is normal. Judgment and thought content normal.  Vitals reviewed. t   Labs    Chemistry Recent Labs  Lab 10/02/2018 1314 10/13/2018 2126 10/26/18 0219 10/26/18 0327 10/26/18 0522 10/26/18 0824  NA 136 134* 133* 134* 134* 135  K 3.1*  3.1* 3.2* 3.2* 3.2* 4.0  CL 102 100 100  --  101  --   CO2 22 21* 18*  --  19*  --   GLUCOSE 208* 189* 210*  --  232*  --   BUN 27* 24* 24*  --  24*  --   CREATININE 1.58* 1.55* 1.45*  --  1.52*  --   CALCIUM 8.3* 8.2* 8.1*  --  8.1*  --   PROT 6.0* 5.8*  --   --  5.6*  --   ALBUMIN 3.0* 2.9*  --   --  2.7*  --   AST 34 95*  --   --  282*  --   ALT 16 63*  --   --  69*  --   ALKPHOS 83 81  --   --  83  --   BILITOT 1.0 0.6  --   --  1.2  --   GFRNONAA 40* 41* 44*  --  41*  --   GFRAA 46* 47* 51*  --  48*  --   ANIONGAP 12 13 15   --  14  --      Hematology Recent Labs  Lab 10/17/2018 1314 10/22/2018 2126 10/26/18 0327 10/26/18 0522 10/26/18 0824  WBC 6.6 7.0  --  10.1  --   RBC 4.12* 3.96*  --  3.93*  --   HGB 11.3* 10.9* 10.5* 10.6* 9.9*  HCT 36.4* 35.5* 31.0* 34.4* 29.0*  MCV 88.3 89.6  --  87.5  --   MCH 27.4 27.5  --  27.0  --   MCHC 31.0 30.7  --  30.8  --   RDW 16.1* 16.4*  --  16.3*  --   PLT 214 288  --  288  --     Cardiac Enzymes Recent Labs  Lab 10/23/2018 1314 10/11/2018 1539 10/08/2018 2126  TROPONINI 0.07* 0.19* 0.94*    Recent Labs  Lab 10/02/2018 1316  TROPIPOC 0.04     BNPNo results for input(s): BNP, PROBNP in the last 168 hours.   DDimer No results for input(s): DDIMER in the last 168 hours.   Radiology    Mr Brain Wo Contrast  Result Date: 10/16/2018 CLINICAL DATA:  Altered level of consciousness. Prostate cancer history. EXAM: MRI HEAD WITHOUT CONTRAST TECHNIQUE: Multiplanar, multiecho pulse sequences of the brain and surrounding structures were obtained without intravenous contrast. COMPARISON:  CT head 10/17/2018 FINDINGS: Brain: Moderate atrophy. Chronic hemorrhagic infarction left basal ganglia/external capsule. Mild chronic microvascular ischemic change elsewhere in the white matter. Negative for acute infarct. Negative for mass or edema. No midline shift. Vascular: Normal arterial flow void Skull and upper cervical spine:  Negative  Sinuses/Orbits: Moderate mucosal edema throughout the paranasal sinuses. No orbital mass. Left cataract surgery Other: None IMPRESSION: Atrophy and chronic ischemic changes as above. Chronic hemorrhagic infarction left lateral basal ganglia. No acute abnormality. Electronically Signed   By: Franchot Gallo M.D.   On: 10/21/2018 16:52   US Carotid Bilateral (at [facility] Only)  Result Date: 10/08/2018 CLINICAL DATA:  Altered mental status, ataxic, hyperlipidemia EXAM: BILATERAL CAROTID DUPLEX ULTRASOUND TECHNIQUE: Pearline Cables scale imaging, color Doppler and duplex ultrasound were performed of bilateral carotid and vertebral arteries in the neck. COMPARISON:  10/05/2018 head CT FINDINGS: Criteria: Quantification of carotid stenosis is based on velocity parameters that correlate the residual internal carotid diameter with NASCET-based stenosis levels, using the diameter of the distal internal carotid lumen as the denominator for stenosis measurement. The following velocity measurements were obtained: RIGHT ICA: 71/18 cm/sec CCA: 24/40 cm/sec SYSTOLIC ICA/CCA RATIO:  0.9 ECA: 127 cm/sec LEFT ICA: 105/24 cm/sec CCA: 102/72 cm/sec SYSTOLIC ICA/CCA RATIO:  0.8 ECA: 160 cm/sec RIGHT CAROTID ARTERY: Moderate intimal thickening and diffuse carotid atherosclerosis. No hemodynamically significant right ICA stenosis, velocity elevation, or turbulent flow. Degree of narrowing less than 50%. RIGHT VERTEBRAL ARTERY:  Antegrade LEFT CAROTID ARTERY: Similar scattered moderate intimal thickening and diffuse carotid atherosclerosis. No hemodynamically significant left ICA stenosis, velocity elevation, or turbulent flow. LEFT VERTEBRAL ARTERY:  Antegrade IMPRESSION: Moderate carotid atherosclerosis. No hemodynamically significant ICA stenosis. Degree of narrowing less than 50% bilaterally by ultrasound criteria. Patent antegrade vertebral flow bilaterally Electronically Signed   By: Jerilynn Mages.  Shick M.D.   On: 10/27/2018 16:17   Dg Chest Port  1 View  Result Date: 10/26/2018 CLINICAL DATA:  Dyspnea EXAM: PORTABLE CHEST 1 VIEW COMPARISON:  Yesterday FINDINGS: Bilateral symmetric perihilar airspace disease. Chronic cardiomegaly. Status post CABG. No effusion or pneumothorax. Stable aortic and hilar contours. IMPRESSION: Pulmonary edema. Electronically Signed   By: Monte Fantasia M.D.   On: 10/26/2018 05:15   Dg Chest Port 1 View  Result Date: 10/05/2018 CLINICAL DATA:  Chest pain. EXAM: PORTABLE CHEST 1 VIEW COMPARISON:  Frontal and lateral views 09/14/2018 FINDINGS: Post median sternotomy and CABG. Mild cardiomegaly. Diffuse interstitial opacities most prominent in the perihilar and suprahilar regions. No confluent airspace disease. No evidence of pleural effusion or pneumothorax. No acute osseous abnormalities IMPRESSION: Mild cardiomegaly, new from exam last month. New diffuse interstitial opacities most prominent in the perihilar and suprahilar regions, favor pulmonary edema over atypical infection. Electronically Signed   By: Keith Rake M.D.   On: 09/29/2018 22:07   Ct Head Code Stroke Wo Contrast  Result Date: 10/21/2018 CLINICAL DATA:  Code stroke.  Ataxia.  Syncope. EXAM: CT HEAD WITHOUT CONTRAST TECHNIQUE: Contiguous axial images were obtained from the base of the skull through the vertex without intravenous contrast. COMPARISON:  10/14/2006 FINDINGS: Brain: There is no evidence of acute infarct, intracranial hemorrhage, mass, midline shift, or extra-axial fluid collection. Mildly prominent, symmetric extra-axial CSF spaces over both frontal convexities are favored to be secondary to progressive cerebral atrophy rather than reflecting subdural hygromas. A chronic infarct is again noted involving the left basal ganglia and external capsule. Scattered hypodensities elsewhere in the cerebral white matter bilaterally have mildly progressed from the prior study and are nonspecific but compatible with mild chronic small vessel ischemic  disease. Vascular: Calcified atherosclerosis at the skull base. No hyperdense vessel. Skull: No fracture or focal osseous lesion. Sinuses/Orbits: Near complete opacification of the right sphenoid sinus, likely by combination of fluid and  mucosal thickening and with sclerosis and thickening of the sinus walls indicating an element of chronicity. Moderate bilateral ethmoid air cell mucosal thickening. Small volume fluid in the frontal sinuses. Clear mastoid air cells. Left cataract extraction. Other: None. ASPECTS [facility] Early CT Score) Not scored with this history. IMPRESSION: 1. No evidence of acute intracranial abnormality. 2. Chronic left basal ganglia/external capsule infarct. 3. Mildly progressive cerebral atrophy and chronic small vessel ischemic disease. These results were called by telephone at the time of interpretation on 09/27/2018 at 1:33 pm to Dr. Tomi Bamberger, who verbally acknowledged these results. Electronically Signed   By: Logan Bores M.D.   On: 09/29/2018 13:33   [place] Ekg Site Rite  Result Date: 10/26/2018 If Site Rite image not attached, placement could not be confirmed due to current cardiac rhythm.   Cardiac Studies   Cardiac cath-PCI: Culprit lesion 90 % mid LAD after LIMA insertion.  Otherwise LIMA-LAD patent.  Extensive 60 to 80% lesions in the LAD with near occlusion at the LIMA insertion.  Known CTO of circumflex and RCA as well as known occlusion of grafts to RI/circumflex and RCA.  Has collaterals from LIMA and diagonal with patent stent to the RCA and circumflex system. -->  DES PCI to the LAD through LIMA (synergy DES 2.5 mm x 20 mm.  Post- Intervention      Most recent echo from 2014 EF 40 to 45%.  Preliminary review of echocardiogram shows severe hypokinesis globally with anterior and inferior near akinesis.  EF estimated maybe 15 to 20% at most.  Patient Profile     85 y.o. male with significant history of CAD having CABG and redo CABG living mostly on  LIMA LAD as well as the proximal LAD to the major diagonal branch with patent stent who had a baseline EF of 40 to 45%.  He was initially made [facility] with altered mental status episodes and suffered a VT arrest while on the floor.  He had CPR and shock with ROSC -> post resuscitation EKG showed anterior ST elevations. -->  Transferred to [facility] for emergent cath.  Found to have 90% stenosis in the LAD downstream LIMA insertion.  Likely culprit for arrhythmia.  Treated with DES stent.  Assessment & Plan    Principal Problem:   Acute ST elevation myocardial infarction (STEMI) involving left anterior descending (LAD) coronary artery (HCC) Active Problems:   Coronary artery disease involving coronary bypass graft of native heart with angina pectoris (HCC)   Coronary artery disease involving native coronary artery of native heart with unstable angina pectoris (HCC)   Cardiac arrest with ventricular fibrillation (HCC)   Acute pulmonary edema with congestive heart failure (HCC)   Acute on chronic combined systolic and diastolic CHF (congestive heart failure) (HCC)   Essential hypertension   Acute encephalopathy   Chronic kidney disease (CKD), stage III (moderate) (HCC)   Gout   History of TIAs   Type 2 diabetes mellitus with hyperlipidemia (HCC)   Accelerated hypertension with diastolic congestive heart failure, NYHA class 3 ([place])  Initially admitted with altered mental status but was noting palpitations without chest pain.  Upon arrival to him any pain, he was actually clear and then had another episode of confusion and subsequently developed VT.  Had sustained VT with cardiac arrest treated with CPR and defibrillation.  Was placed on amiodarone drip.  Had ROSC and awake and alert.  Subsequent EKG showed anterior ST elevations and he was brought to  [facility] for cardiac catheterization.  Known severe native and graft disease with essentially patent large diagonal branch and  LIMA-LAD.  Culprit lesion was 95% LAD after graft insertion.  This was treated with DES stent.  Post PCI was profoundly hypertensive and pulmonary edema placed on BiPAP and diuresis added.  Was also placed on calcium channel blocker drip (Cleviprex).  Currently blood pressures are in the 1 teens, he did not tolerate coming off of BiPAP this morning despite having good oxygenation and stable blood gas.  Rhythm is stabilized, there is question of possible atrial fibrillation, however the EKG this morning did not show A. fib, was mostly short run of PAT followed by frequent PVCs but clear P waves noted.  Still not clear of the true etiology for his confusion, but was not thought to be TIA by neurology.  Would need to have neurology reevaluate.  Major concern now is the extent of his cardiomyopathy.  Check a 2D echocardiogram today to reassess up.  Was previously 47 to 45%.  I suspect that it is dropped now with this current event.  Plan:   Continue amiodarone at low-dose drip.  Will wean off Cleviprex and start hydralazine as well as carvedilol (in lieu of metoprolol since he is on amiodarone)  IV Lasix (already received a total of 60, 20, 20 mg with decent urine output, still remains with significant pulmonary on chest x-ray with BiPAP requirement --> we will give an additional 80 mg IV Lasix now with additional 40 in the evening.  Recheck chest x-ray in the evening.  Reevaluate diuretic in the morning.  On aspirin and Brilinta, I do not see A. fib, therefore I do not see a need for DOAC.  Statin  On metformin at home for diabetes, will place on sliding scale insulin here.  VT probably thought to be ischemic and therefore unless EF has reduced, would probably not consider ICD prior to discharge, however will discuss with the EP.  Consider the possibility of LifeVest (will discuss with interventional colleagues).  --As an addendum: Echocardiogram now reviewed showing EF maybe 15 to 20%.   Have discussed with Advanced Heart Failure Service for assistance, as he has had minimal urine output despite 80 of Lasix.  Will likely require milrinone and and potentially Swan-Ganz catheter versus PICC line. --We will write for milrinone to begin and await their assistance.  Have also discussed with critical care based on his level of dyspnea on BiPAP.  My concern is that he may potentially have fatigue.  Would likely need recheck of ABG, but will defer to PCCM.  Patient is critically ill with multiple ongoing conditions with complex decision making.  Greater than 70 minutes was spent with the patient today in chart review and directly with the patient and patient care.  > 50 % of the time spent with direct patient care.  Glenetta Hew, MD  For questions or updates, please contact [facility] Please consult [email or web address] for contact info under        Signed, Glenetta Hew, MD  10/26/2018, 12:38 PM

## 2018-10-27 ENCOUNTER — Inpatient Hospital Stay (HOSPITAL_COMMUNITY): Payer: Medicare HMO

## 2018-10-27 ENCOUNTER — Encounter (HOSPITAL_COMMUNITY): Payer: Self-pay

## 2018-10-27 DIAGNOSIS — E440 Moderate protein-calorie malnutrition: Secondary | ICD-10-CM

## 2018-10-27 DIAGNOSIS — J9601 Acute respiratory failure with hypoxia: Secondary | ICD-10-CM

## 2018-10-27 LAB — COOXEMETRY PANEL
Carboxyhemoglobin: 1.9 % — ABNORMAL HIGH (ref 0.5–1.5)
Carboxyhemoglobin: 0.8 % (ref 0.5–1.5)
Methemoglobin: 1.2 % (ref 0.0–1.5)
Methemoglobin: 1.4 % (ref 0.0–1.5)
O2 Saturation: 89.3 %
O2 Saturation: 59.3 %
TOTAL HEMOGLOBIN: 16.1 g/dL — AB (ref 12.0–16.0)
Total hemoglobin: 13 g/dL (ref 12.0–16.0)

## 2018-10-27 LAB — POCT I-STAT 7, (LYTES, BLD GAS, ICA,H+H)
Acid-base deficit: 2 mmol/L (ref 0.0–2.0)
Acid-base deficit: 2 mmol/L (ref 0.0–2.0)
Bicarbonate: 21.1 mmol/L (ref 20.0–28.0)
Bicarbonate: 22.4 mmol/L (ref 20.0–28.0)
Calcium, Ion: 1.08 mmol/L — ABNORMAL LOW (ref 1.15–1.40)
Calcium, Ion: 1.09 mmol/L — ABNORMAL LOW (ref 1.15–1.40)
HCT: 25 % — ABNORMAL LOW (ref 39.0–52.0)
HCT: 27 % — ABNORMAL LOW (ref 39.0–52.0)
Hemoglobin: 8.5 g/dL — ABNORMAL LOW (ref 13.0–17.0)
Hemoglobin: 9.2 g/dL — ABNORMAL LOW (ref 13.0–17.0)
O2 Saturation: 95 %
O2 Saturation: 100 %
PH ART: 7.49 — AB (ref 7.350–7.450)
Patient temperature: 98.4
Patient temperature: 98.6
Potassium: 3.8 mmol/L (ref 3.5–5.1)
Potassium: 3.9 mmol/L (ref 3.5–5.1)
Sodium: 135 mmol/L (ref 135–145)
Sodium: 134 mmol/L — ABNORMAL LOW (ref 135–145)
TCO2: 22 mmol/L (ref 22–32)
TCO2: 23 mmol/L (ref 22–32)
pCO2 arterial: 27.7 mmHg — ABNORMAL LOW (ref 32.0–48.0)
pCO2 arterial: 37.3 mmHg (ref 32.0–48.0)
pH, Arterial: 7.386 (ref 7.350–7.450)
pO2, Arterial: 68 mmHg — ABNORMAL LOW (ref 83.0–108.0)
pO2, Arterial: 272 mmHg — ABNORMAL HIGH (ref 83.0–108.0)

## 2018-10-27 LAB — GLUCOSE, CAPILLARY
Glucose-Capillary: 167 mg/dL — ABNORMAL HIGH (ref 70–99)
Glucose-Capillary: 176 mg/dL — ABNORMAL HIGH (ref 70–99)
Glucose-Capillary: 199 mg/dL — ABNORMAL HIGH (ref 70–99)

## 2018-10-27 LAB — CBC
HCT: 26.8 % — ABNORMAL LOW (ref 39.0–52.0)
Hemoglobin: 8.6 g/dL — ABNORMAL LOW (ref 13.0–17.0)
MCH: 28.1 pg (ref 26.0–34.0)
MCHC: 32.1 g/dL (ref 30.0–36.0)
MCV: 87.6 fL (ref 80.0–100.0)
Platelets: 274 10*3/uL (ref 150–400)
RBC: 3.06 MIL/uL — ABNORMAL LOW (ref 4.22–5.81)
RDW: 16.5 % — AB (ref 11.5–15.5)
WBC: 15.1 10*3/uL — ABNORMAL HIGH (ref 4.0–10.5)
nRBC: 0 % (ref 0.0–0.2)

## 2018-10-27 LAB — BASIC METABOLIC PANEL
Anion gap: 11 (ref 5–15)
BUN: 32 mg/dL — ABNORMAL HIGH (ref 8–23)
CO2: 21 mmol/L — AB (ref 22–32)
Calcium: 8.2 mg/dL — ABNORMAL LOW (ref 8.9–10.3)
Chloride: 106 mmol/L (ref 98–111)
Creatinine, Ser: 2.94 mg/dL — ABNORMAL HIGH (ref 0.61–1.24)
GFR calc non Af Amer: 19 mL/min — ABNORMAL LOW (ref >60)
GFR, EST AFRICAN AMERICAN: 22 mL/min — AB (ref >60)
Glucose, Bld: 173 mg/dL — ABNORMAL HIGH (ref 70–99)
Potassium: 3.9 mmol/L (ref 3.5–5.1)
Sodium: 138 mmol/L (ref 135–145)

## 2018-10-27 LAB — SEDIMENTATION RATE: Sed Rate: 52 mm/hr — ABNORMAL HIGH (ref 0–16)

## 2018-10-27 LAB — MAGNESIUM
Magnesium: 2.2 mg/dL (ref 1.7–2.4)
Magnesium: 2.1 mg/dL (ref 1.7–2.4)

## 2018-10-27 LAB — PHOSPHORUS: Phosphorus: 2.3 mg/dL — ABNORMAL LOW (ref 2.5–4.6)

## 2018-10-27 LAB — C-REACTIVE PROTEIN: CRP: 17.1 mg/dL — ABNORMAL HIGH

## 2018-10-27 LAB — HEPARIN LEVEL (UNFRACTIONATED)

## 2018-10-27 MED ORDER — ASPIRIN 81 MG PO CHEW
81.0000 mg | CHEWABLE_TABLET | Freq: Every day | ORAL | Status: DC
  Administered 2018-10-28: 81 mg
  Filled 2018-10-27: qty 1

## 2018-10-27 MED ORDER — ORAL CARE MOUTH RINSE
15.0000 mL | OROMUCOSAL | Status: DC
  Administered 2018-10-27 – 2018-10-29 (×19): 15 mL via OROMUCOSAL

## 2018-10-27 MED ORDER — SODIUM CHLORIDE 0.9% FLUSH: 10.0000 mL | INTRAVENOUS | Status: DC | PRN

## 2018-10-27 MED ORDER — PIPERACILLIN-TAZOBACTAM 3.375 G IVPB
3.3750 g | Freq: Three times a day (TID) | INTRAVENOUS | Status: DC
  Administered 2018-10-27 – 2018-10-28 (×3): 3.375 g via INTRAVENOUS
  Filled 2018-10-27 (×3): qty 50

## 2018-10-27 MED ORDER — LEFLUNOMIDE 20 MG PO TABS
10.0000 mg | ORAL_TABLET | Freq: Every day | ORAL | Status: DC
  Filled 2018-10-27: qty 0.5

## 2018-10-27 MED ORDER — ETOMIDATE 2 MG/ML IV SOLN
20.0000 mg | Freq: Once | INTRAVENOUS | Status: AC
  Administered 2018-10-27: 20 mg via INTRAVENOUS

## 2018-10-27 MED ORDER — LINEZOLID 600 MG/300ML IV SOLN
600.0000 mg | Freq: Two times a day (BID) | INTRAVENOUS | Status: DC
  Administered 2018-10-27 – 2018-10-29 (×4): 600 mg via INTRAVENOUS
  Filled 2018-10-27 (×5): qty 300

## 2018-10-27 MED ORDER — ATORVASTATIN CALCIUM 80 MG PO TABS
80.0000 mg | ORAL_TABLET | Freq: Every day | ORAL | Status: DC
  Administered 2018-10-27 – 2018-10-28 (×2): 80 mg
  Filled 2018-10-27 (×2): qty 1

## 2018-10-27 MED ORDER — DEXMEDETOMIDINE HCL IN NACL 200 MCG/50ML IV SOLN
0.0000 ug/kg/h | INTRAVENOUS | Status: DC
  Administered 2018-10-27: 0.8 ug/kg/h via INTRAVENOUS
  Administered 2018-10-27: 0.3 ug/kg/h via INTRAVENOUS
  Administered 2018-10-27: 0.2 ug/kg/h via INTRAVENOUS
  Administered 2018-10-28 – 2018-10-29 (×10): 1 ug/kg/h via INTRAVENOUS
  Filled 2018-10-27 (×14): qty 50

## 2018-10-27 MED ORDER — ACETAMINOPHEN 160 MG/5ML PO SOLN
650.0000 mg | Freq: Four times a day (QID) | ORAL | Status: DC | PRN
  Administered 2018-10-27 – 2018-10-28 (×2): 650 mg
  Filled 2018-10-27 (×2): qty 20.3

## 2018-10-27 MED ORDER — FOLIC ACID 1 MG PO TABS: 0.5000 mg | ORAL_TABLET | Freq: Every day | ORAL | Status: DC

## 2018-10-27 MED ORDER — SODIUM CHLORIDE 0.9% FLUSH
10.0000 mL | Freq: Two times a day (BID) | INTRAVENOUS | Status: DC
  Administered 2018-10-28: 10 mL

## 2018-10-27 MED ORDER — FENTANYL BOLUS VIA INFUSION
25.0000 ug | INTRAVENOUS | Status: DC | PRN
  Administered 2018-10-27 – 2018-10-29 (×5): 25 ug via INTRAVENOUS
  Filled 2018-10-27: qty 25

## 2018-10-27 MED ORDER — FENTANYL 2500MCG IN NS 250ML (10MCG/ML) PREMIX INFUSION
25.0000 ug/h | INTRAVENOUS | Status: DC
  Administered 2018-10-27: 50 ug/h via INTRAVENOUS
  Administered 2018-10-28: 100 ug/h via INTRAVENOUS
  Administered 2018-10-29 (×2): 250 ug/h via INTRAVENOUS
  Filled 2018-10-27 (×4): qty 250

## 2018-10-27 MED ORDER — FENTANYL CITRATE (PF) 100 MCG/2ML IJ SOLN
INTRAMUSCULAR | Status: AC
  Administered 2018-10-27: 100 ug via INTRAVENOUS
  Filled 2018-10-27: qty 2

## 2018-10-27 MED ORDER — VITAL AF 1.2 CAL PO LIQD
1000.0000 mL | ORAL | Status: DC
  Administered 2018-10-27: 1000 mL

## 2018-10-27 MED ORDER — ACETAMINOPHEN 325 MG PO TABS: 650.0000 mg | ORAL_TABLET | ORAL | Status: DC | PRN

## 2018-10-27 MED ORDER — MIDAZOLAM HCL 2 MG/2ML IJ SOLN
INTRAMUSCULAR | Status: AC
  Administered 2018-10-27: 2 mg via INTRAVENOUS
  Filled 2018-10-27: qty 2

## 2018-10-27 MED ORDER — ALBUMIN HUMAN 25 % IV SOLN
25.0000 g | Freq: Once | INTRAVENOUS | Status: AC
  Administered 2018-10-27: 25 g via INTRAVENOUS
  Filled 2018-10-27: qty 50

## 2018-10-27 MED ORDER — PRO-STAT SUGAR FREE PO LIQD
30.0000 mL | Freq: Two times a day (BID) | ORAL | Status: DC
  Administered 2018-10-27: 30 mL
  Filled 2018-10-27 (×2): qty 30

## 2018-10-27 MED ORDER — MIDAZOLAM HCL 2 MG/2ML IJ SOLN
2.0000 mg | Freq: Once | INTRAMUSCULAR | Status: AC
  Administered 2018-10-27: 2 mg via INTRAVENOUS

## 2018-10-27 MED ORDER — CHLORHEXIDINE GLUCONATE CLOTH 2 % EX PADS
6.0000 | MEDICATED_PAD | Freq: Every day | CUTANEOUS | Status: DC
  Administered 2018-10-27: 6 via TOPICAL

## 2018-10-27 MED ORDER — CHLORHEXIDINE GLUCONATE 0.12% ORAL RINSE (MEDLINE KIT)
15.0000 mL | Freq: Two times a day (BID) | OROMUCOSAL | Status: DC
  Administered 2018-10-27 – 2018-10-29 (×4): 15 mL via OROMUCOSAL

## 2018-10-27 MED ORDER — FENTANYL CITRATE (PF) 100 MCG/2ML IJ SOLN: 50.0000 ug | Freq: Once | INTRAMUSCULAR | Status: DC

## 2018-10-27 MED ORDER — CHLORHEXIDINE GLUCONATE CLOTH 2 % EX PADS
6.0000 | MEDICATED_PAD | Freq: Every day | CUTANEOUS | Status: DC
  Administered 2018-10-28: 6 via TOPICAL

## 2018-10-27 MED ORDER — INSULIN ASPART 100 UNIT/ML ~~LOC~~ SOLN
0.0000 [IU] | SUBCUTANEOUS | Status: DC
  Administered 2018-10-27 (×2): 2 [IU] via SUBCUTANEOUS
  Administered 2018-10-28: 1 [IU] via SUBCUTANEOUS
  Administered 2018-10-28: 3 [IU] via SUBCUTANEOUS
  Administered 2018-10-28: 5 [IU] via SUBCUTANEOUS
  Administered 2018-10-28: 3 [IU] via SUBCUTANEOUS
  Administered 2018-10-28 – 2018-10-29 (×2): 5 [IU] via SUBCUTANEOUS
  Administered 2018-10-29: 3 [IU] via SUBCUTANEOUS
  Administered 2018-10-29: 5 [IU] via SUBCUTANEOUS

## 2018-10-27 MED ORDER — TICAGRELOR 90 MG PO TABS
90.0000 mg | ORAL_TABLET | Freq: Two times a day (BID) | ORAL | Status: DC
  Administered 2018-10-27 – 2018-10-28 (×3): 90 mg
  Filled 2018-10-27 (×3): qty 1

## 2018-10-27 MED ORDER — ROCURONIUM BROMIDE 50 MG/5ML IV SOLN
50.0000 mg | Freq: Once | INTRAVENOUS | Status: AC
  Administered 2018-10-27: 50 mg via INTRAVENOUS
  Filled 2018-10-27: qty 5

## 2018-10-27 MED ORDER — NOREPINEPHRINE 16 MG/250ML-% IV SOLN
0.0000 ug/min | INTRAVENOUS | Status: DC
  Administered 2018-10-27: 45 ug/min via INTRAVENOUS
  Administered 2018-10-27: 10 ug/min via INTRAVENOUS
  Administered 2018-10-28: 60 ug/min via INTRAVENOUS
  Administered 2018-10-28: 40 ug/min via INTRAVENOUS
  Administered 2018-10-28: 45 ug/min via INTRAVENOUS
  Administered 2018-10-29 (×2): 25 ug/min via INTRAVENOUS
  Filled 2018-10-27 (×7): qty 250

## 2018-10-27 MED ORDER — PANTOPRAZOLE SODIUM 40 MG PO PACK
40.0000 mg | PACK | Freq: Every day | ORAL | Status: DC
  Administered 2018-10-27 – 2018-10-28 (×2): 40 mg
  Filled 2018-10-27 (×2): qty 20

## 2018-10-27 MED ORDER — FENTANYL CITRATE (PF) 100 MCG/2ML IJ SOLN
100.0000 ug | Freq: Once | INTRAMUSCULAR | Status: AC
  Administered 2018-10-27: 100 ug via INTRAVENOUS

## 2018-10-27 MED ORDER — NOREPINEPHRINE-SODIUM CHLORIDE 4-0.9 MG/250ML-% IV SOLN
0.0000 ug/min | INTRAVENOUS | Status: DC
  Administered 2018-10-27: 5 ug/min via INTRAVENOUS
  Filled 2018-10-27: qty 250

## 2018-10-27 MED ORDER — ALLOPURINOL 300 MG PO TABS
300.0000 mg | ORAL_TABLET | Freq: Every day | ORAL | Status: DC
  Filled 2018-10-27: qty 1

## 2018-10-27 NOTE — Procedures (Signed)
Arterial Catheter Insertion Procedure Note [name] [name] [id number] 21-Sep-1932  Procedure: Insertion of Arterial Catheter  Indications: Blood pressure monitoring and Frequent blood sampling  Procedure Details Consent: Unable to obtain consent because of emergent medical necessity. Time Out: Verified patient identification, verified procedure, site/side was marked, verified correct patient position, special equipment/implants available, medications/allergies/relevent history reviewed, required imaging and test results available.  Performed  Maximum sterile technique was used including antiseptics, cap, gloves, gown, hand hygiene, mask and sheet. Skin prep: Chlorhexidine; local anesthetic administered 20 gauge catheter was inserted into right radial artery using the Seldinger technique. ULTRASOUND GUIDANCE USED: YES Evaluation Blood flow good; BP tracing good. Complications: No apparent complications.   [name] 10/27/2018

## 2018-10-27 NOTE — Procedures (Signed)
Intubation Procedure Note [name] [name] [id number] August 06, 1932  Procedure: Intubation Indications: Respiratory insufficiency  Procedure Details Consent: Risks of procedure as well as the alternatives and risks of each were explained to the (patient/caregiver).  Consent for procedure obtained. Time Out: Verified patient identification, verified procedure, site/side was marked, verified correct patient position, special equipment/implants available, medications/allergies/relevent history reviewed, required imaging and test results available.  Performed  Maximum sterile technique was used including gloves, hand hygiene and mask.  MAC    Evaluation Hemodynamic Status: BP stable throughout; O2 sats: stable throughout Patient's Current Condition: stable Complications: No apparent complications Patient did tolerate procedure well. Chest X-ray ordered to verify placement.  CXR: pending.   [name] [name] 10/27/2018

## 2018-10-27 NOTE — Progress Notes (Signed)
eLink Physician-Brief Progress Note Patient Name: Garyson Stelly DOB: Feb 11, 1932 MRN: [id number]   Date of Service  10/27/2018  HPI/Events of Note  Notified of relative hypotension.  BP currently maintained at >65  eICU Interventions  Milrinone adjusted by in house MD. Will continue to monitor closely. Patietn reportedly mentating ok        Judd Lien 10/27/2018, 3:03 AM

## 2018-10-27 NOTE — Progress Notes (Signed)
eLink Physician-Brief Progress Note Patient Name: [name] [name] DOB: 11/02/1931 MRN: [id number]   Date of Service  10/27/2018  HPI/Events of Note  Increasing pressor requirement. Patient intubated earlier today and started on antibiotics, now on norepinephrine and continued on milrinone. CVP 4-5. Low urine output.  eICU Interventions  Will give a trial of albumin     Intervention Category Major Interventions: Hypotension - evaluation and management  Judd Lien 10/27/2018, 7:25 PM

## 2018-10-27 NOTE — Progress Notes (Signed)
Pt with <50cc UOP from 7am, flushed catheter, returned flush.  MD aware.  Bladder scan showing multiple readings of >800.  Verified with additional scanner.  Determined best course was to replace catheter under sterile conditions.  MD requested temp catheter.  28f placed, see documentation in tube and lines.  Pt tolerated well, advanced catheter with no resistance, no urine returned.  MD notified.  Dr Nelda Marseille ordered abd CT.  See chart for results.   Karsten Ro, RN

## 2018-10-27 NOTE — H&P (Addendum)
NAME:  [name] [name], MRN:  [id number], DOB:  01/26/1932, LOS: 2 ADMISSION DATE:  10/09/2018, CONSULTATION DATE:  10/26/2018 REFERRING MD:  Dr. Ellyn Hack, CHIEF COMPLAINT:  Cardiogenic Shock/V. tach arrest  Brief History   [name] [name] is an 85 year old gentleman with CAD status post CABG, PCI, carotid artery disease status post CEA, CKD stage III, ischemic cardiomyopathy (EF 40 to 45%) who initially presented with altered mental status however went into V. tach/V. fib arrest with post ROSC EKG showing ST elevation in anterior leads.  Underwent PCI with DES and found to be in cardiogenic shock with flash pulmonary edema and significantly reduced EF 15 to 20%.  History of present illness   [name] [name] is an 85 year old gentleman with CAD status post CABG and PCI, carotid artery stenosis status post CEA, CKD stage III, ischemic cardiomyopathy (EF 40 to 45%), hypertension, hyperlipidemia, atrial fibrillation who initially presented to [facility] on 10/05/2018 with acute encephalopathy.  He was evaluated by neurology who did not think the etiology of his AMS was TIA or CVA.  Soon after admission he went into V. tach/V. fib arrest and attained ROSC after 1 minute of CPR and cardioversion.  Post resuscitation EKG revealed ST elevations in the anterior lateral leads and was urgently transported to [facility] for cardiac cardiac catheterization.  While in the lab he was found to have 95% occlusion of the LAD which was treated with DES stent.   Following cardiac catheterization, he was noted to be hypertensive with SBP 150-180s as well as flash pulmonary edema on CXR. He was placed on BiPAP and diuresis was started.  Repeat echocardiogram showed reduced EF of 15 to 20%.   Past Medical History  Coronary artery disease status post CABG, PCI Carotid artery disease status post CEA CKD stage III Ischemic cardiomyopathy (EF 40 to 45%) decreased 10 to 15% on 10/26/2018 Hypertension New onset atrial  fibrillation Chronic gout Hyperlipidemia Type 2 diabetes mellitus Rheumatoid arthritis  Significant Hospital Events   1/29 admission to [facility] with AMS> 1/29 VT/V. tach arrest> 1/29 post arrest EKG with ST elevations in anterior lateral leads> 1/30 PCI with DES of LAD> 1/30 hypertensive, pulmonary edema, repeat echo with EF 15 to 20%> 1/30 PCCM consult>  10/27/2018 family would want intubation short-term to give him adequate amount of time to recover from this event.  Consults:  Interventional cardiology Advanced heart failure team  Procedures:  1/30 PCI with DES of LAD> 10/26/2018 central line placement>>  Significant Diagnostic Tests:  1/30 echocardiogram> EF 10 to 15% severely reduced systolic function  Micro Data:  None  Antimicrobials:  None  Objective   Blood pressure (!) 87/46, pulse 84, temperature 99.9 F (37.7 C), temperature source Oral, resp. rate (!) 35, height 6\' 1"  (1.854 m), weight 74.9 kg, SpO2 96 %. CVP:  [5 mmHg-7 mmHg] 7 mmHg  FiO2 (%):  [50 %-60 %] 60 %   Intake/Output Summary (Last 24 hours) at 10/27/2018 0347 Last data filed at 10/27/2018 0600 Gross per 24 hour  Intake 1420.47 ml  Output 515 ml  Net 905.47 ml   Filed Weights   10/27/2018 1350 10/26/2018 2215 10/26/18 0307  Weight: 77.9 kg 75.9 kg 74.9 kg    Examination: General: Elderly male who is obviously confused. HEENT: No overt JVD is appreciated Neuro: Mild confusion.  Not orientated to place or time.  Able to follow commands and move all extremities CV: Sounds are irregular cardiac monitor displays periods of  atrial fibrillation PULM: Taken off noninvasive mechanical ventilatory support.  Respiratory rate is 24-30, O2 saturations are currently maintaining greater than 90%. ZO:XWRU, non-tender, bsx4 active  Extremities: warm/dry, 1+ edema  Skin: no rashes or lesions   Resolved Hospital Problem list     Assessment & Plan:  [name] [name] is an 85 year old gentleman with CAD  status post CABG, PCI, carotid artery disease status post CEA, CKD stage III, ischemic cardiomyopathy (EF 40 to 45%) who initially presented with altered mental status however went into V. tach/V. fib arrest with post ROSC EKG showing ST elevation in anterior leads.  Underwent PCI with DES and found to be in cardiogenic shock with flash pulmonary edema and significantly reduced EF 15 to 20%.  10/27/2018 family want short-term intubation if needed after he is been on noninvasive mechanical ventilatory support for the last 48 hours.  #Cardiogenic shock #STEMI s/p PCI with DES to mLAD His ventricular fibrillation ventricular tachycardia arrest was thought to be ischemic given his known history of severe CAD with previous invasive interventions. Cardiology is following Currently on amiodarone 2D echo performed on 130 shows decrease systolic function of 10 to 15% Short-term intubation may be needed to decrease workload of the heart CVP noted to be 3-5   #Acute hypoxic respiratory failure 10/27/2018 currently 48 hours on BiPAP he has been taken off BiPAP.  After discussion with family should he acute respiratory failure he will be intubated for short-term but no tracheostomy PEG or skilled nursing facilities as an option.Marland Kitchen  #CAD s/p CABG, PCI Status post PCI with DES 10/26/2018 Cardiology is following  #?New onset atrial fibrillation Currently having periods of atrial fibrillation Noted to be on amiodarone drip  #Hypertension Monitor Antihypertensives as needed  #Diabetes mellitus Sliding scale insulin protocol  #CKD stage III Monitor creatinine  #Carotid artery stenosis status post CEA Continue ASA Brilinta  #Hyperlipidemia Continue Lipitor  #Rheumatoid arthritis Continue current medication  #Chronic gout Allopurinol   Best practice:  Diet: Heart healthy diet Pain/Anxiety/Delirium protocol (if indicated): None VAP protocol (if indicated): N/A DVT prophylaxis: SCDs GI  prophylaxis: P.o. Protonix Glucose control: SSI Mobility: Bedrest Code Status: 10/27/2018 wife and children at bedside and agreed to short-term intubation if needed. Family Communication: Wife and children updated at bedside 10/27/2018 Disposition: Remain in ICU  Labs   CBC: Recent Labs  Lab 10/09/2018 1314 10/23/2018 2126  10/26/18 0522 10/26/18 0824 10/26/18 2041 10/27/18 0053 10/27/18 0500  WBC 6.6 7.0  --  10.1  --   --   --  15.1*  NEUTROABS 5.0 4.2  --   --   --   --   --   --   HGB 11.3* 10.9*   < > 10.6* 9.9* 8.8* 8.5* 8.6*  HCT 36.4* 35.5*   < > 34.4* 29.0* 26.0* 25.0* 26.8*  MCV 88.3 89.6  --  87.5  --   --   --  87.6  PLT 214 288  --  288  --   --   --  274   < > = values in this interval not displayed.    Basic Metabolic Panel: Recent Labs  Lab 10/19/2018 1314 09/29/2018 2126 10/26/18 0219  10/26/18 0522 10/26/18 0824 10/26/18 1550 10/26/18 2041 10/27/18 0053 10/27/18 0500  NA 136 134* 133*   < > 134* 135 134* 134* 135 138  K 3.1* 3.1* 3.2*   < > 3.2* 4.0 4.9 3.9 3.8 3.9  CL 102 100 100  --  101  --  103  --   --  106  CO2 22 21* 18*  --  19*  --  21*  --   --  21*  GLUCOSE 208* 189* 210*  --  232*  --  162*  --   --  173*  BUN 27* 24* 24*  --  24*  --  26*  --   --  32*  CREATININE 1.58* 1.55* 1.45*  --  1.52*  --  1.97*  --   --  2.94*  CALCIUM 8.3* 8.2* 8.1*  --  8.1*  --  8.2*  --   --  8.2*  MG 2.2 2.2 2.7*  --  2.5*  --   --   --   --  2.2   < > = values in this interval not displayed.   GFR: Estimated Creatinine Clearance: 19.8 mL/min (A) (by C-G formula based on SCr of 2.94 mg/dL (H)). Recent Labs  Lab 10/24/2018 1314 10/07/2018 2126 10/26/18 0513 10/26/18 0522 10/26/18 0802 10/27/18 0500  WBC 6.6 7.0  --  10.1  --  15.1*  LATICACIDVEN  --   --  1.9  --  1.8  --     Liver Function Tests: Recent Labs  Lab 10/03/2018 1314 10/11/2018 2126 10/26/18 0522  AST 34 95* 282*  ALT 16 63* 69*  ALKPHOS 83 81 83  BILITOT 1.0 0.6 1.2  PROT 6.0* 5.8* 5.6*    ALBUMIN 3.0* 2.9* 2.7*   No results for input(s): LIPASE, AMYLASE in the last 168 hours. No results for input(s): AMMONIA in the last 168 hours.  ABG    Component Value Date/Time   PHART 7.490 (H) 10/27/2018 0053   PCO2ART 27.7 (L) 10/27/2018 0053   PO2ART 68.0 (L) 10/27/2018 0053   HCO3 21.1 10/27/2018 0053   TCO2 22 10/27/2018 0053   ACIDBASEDEF 2.0 10/27/2018 0053   O2SAT 59.3 10/27/2018 0825     Coagulation Profile: Recent Labs  Lab 10/18/2018 1314  INR 1.08    Cardiac Enzymes: Recent Labs  Lab 10/03/2018 1314 10/13/2018 1539 10/20/2018 2126  TROPONINI 0.07* 0.19* 0.94*    HbA1C: Hgb A1c MFr Bld  Date/Time Value Ref Range Status  04/09/2009 11:35 PM  4.6 - 6.1 % Final   6.0 (NOTE) The ADA recommends the following therapeutic goal for glycemic control related to Hgb A1c measurement: Goal of therapy: <6.5 Hgb A1c  Reference: American Diabetes Association: Clinical Practice Recommendations 2010, Diabetes Care, 2010, 33: (Suppl  1).    CBG: Recent Labs  Lab 10/26/18 1311 10/26/18 1803  GLUCAP 187* 130*    Steve Minor ACNP Maryanna Shape PCCM Pager [phone] till 1 pm If no answer page [phone] 10/27/2018, 9:28 AM  Attending Note:  85 year old male who has been steadily deteriorating from a cardiac standpoint for months now.  Patient presenting with renal failure, respiratory failure and heart failure.  CVP is 5.  Required BiPAP overnight for respiratory failure.  On exam, diffuse crackles.  I reviewed CXR myself, pulmonary edema noted.  Discussed with cardiology.  Will order a CT of the chest today.  If fibrosis is evident then will recommend full comfort and family is agreeable to that.  If it is a pneumonia or edema then will consider intubation and/or dialysis.  Will come back and speak with family.  Full code for now.  The patient is critically ill with multiple organ systems failure and requires high complexity decision making for assessment and support,  frequent evaluation and titration of therapies, application of advanced monitoring technologies and extensive interpretation of multiple databases.   Critical Care Time devoted to patient care services described in this note is  37  Minutes. This time reflects time of care of this signee Dr [name]. This critical care time does not reflect procedure time, or teaching time or supervisory time of PA/NP/Med student/Med Resident etc but could involve care discussion time.  Rush Farmer, M.D. [facility] Pulmonary/Critical Care Medicine. Pager: [phone]. After hours pager: [phone].

## 2018-10-27 NOTE — Progress Notes (Signed)
Pt transported with RN to CT from 2H15 and back on ventilator. Pt tolerated well, no complications and vital signs remain stable.

## 2018-10-27 NOTE — Progress Notes (Addendum)
Advanced Heart Failure Rounding Note  PCP-Cardiologist: No primary care provider on file.   Subjective:    Cr 1.58 -> 2.94. K 3.9. Mg 2.2  Initial coox 53.8% on milrinone 0.25 mcg/kg/min. Coox 89.3% this am, likely falsely high. Repeat pending.   Remains tachypneic on BiPAP. Respiratory rate consistently > 30. Complaining of cough, and soreness in his chest.   ABG 0053 this am: pH 7.490, pCO2 27.7, pO2 68, Bicarb 21.1  Echo 10/26/2018 15-20% preliminary per Dr. Ellyn Hack.   LHC 10/26/2018  Prox RCA to Mid RCA lesion is 100% stenosed.  Ost Cx to Prox Cx lesion is 100% stenosed.  Ost 1st Diag to 1st Diag lesion is 5% stenosed.  Prox LAD lesion is 60% stenosed.  Mid LAD-1 lesion is 80% stenosed.  Mid LAD-2 lesion is 90% stenosed.  Post intervention, there is a 0% residual stenosis in LAD.   A stent was successfully placed to LAD.   Objective:   Weight Range: 74.9 kg Body mass index is 21.79 kg/m.   Vital Signs:   Temp:  [97.5 F (36.4 C)-99.9 F (37.7 C)] 99.9 F (37.7 C) (01/31 0700) Pulse Rate:  [47-87] 84 (01/31 0700) Resp:  [18-47] 35 (01/31 0700) BP: (78-134)/(40-85) 87/46 (01/31 0700) SpO2:  [91 %-100 %] 96 % (01/31 0700) FiO2 (%):  [50 %-60 %] 60 % (01/31 0400) Last BM Date: 10/18/18  Weight change: Filed Weights   09/30/2018 1350 10/15/2018 2215 10/26/18 0307  Weight: 77.9 kg 75.9 kg 74.9 kg   Intake/Output:   Intake/Output Summary (Last 24 hours) at 10/27/2018 0806 Last data filed at 10/27/2018 0600 Gross per 24 hour  Intake 1426.47 ml  Output 515 ml  Net 911.47 ml    Physical Exam    CVP 4-5 cm, personally reviewed.  General:  Ill appearing. On BiPAP HEENT: + BiPAP Neck: Supple. JVP difficult. Carotids 2+ bilat; no bruits. No lymphadenopathy or thyromegaly appreciated. Cor: PMI nondisplaced. Irregular with frequent ectopy.  Lungs: Diminished throughout with mechanical breathing sounds.  Abdomen: Soft, nontender, nondistended. No  hepatosplenomegaly. No bruits or masses. Good bowel sounds. Extremities: No cyanosis, clubbing, rash, edema Neuro: Alert & orientedx3, cranial nerves grossly intact. moves all 4 extremities w/o difficulty. Affect pleasant  Telemetry   Very frequent ectopy making it difficult to discern If afib vs NSR with ectopy, Rates 70-80s, personally reviewed.   EKG    No new tracings.    Labs    CBC Recent Labs    10/16/2018 1314 10/19/2018 2126  10/26/18 0522  10/27/18 0053 10/27/18 0500  WBC 6.6 7.0  --  10.1  --   --  15.1*  NEUTROABS 5.0 4.2  --   --   --   --   --   HGB 11.3* 10.9*   < > 10.6*   < > 8.5* 8.6*  HCT 36.4* 35.5*   < > 34.4*   < > 25.0* 26.8*  MCV 88.3 89.6  --  87.5  --   --  87.6  PLT 214 288  --  288  --   --  274   < > = values in this interval not displayed.   Basic Metabolic Panel Recent Labs    10/26/18 0522  10/26/18 1550  10/27/18 0053 10/27/18 0500  NA 134*   < > 134*   < > 135 138  K 3.2*   < > 4.9   < > 3.8 3.9  CL 101  --  103  --   --  106  CO2 19*  --  21*  --   --  21*  GLUCOSE 232*  --  162*  --   --  173*  BUN 24*  --  26*  --   --  32*  CREATININE 1.52*  --  1.97*  --   --  2.94*  CALCIUM 8.1*  --  8.2*  --   --  8.2*  MG 2.5*  --   --   --   --  2.2   < > = values in this interval not displayed.   Liver Function Tests Recent Labs    10/04/2018 2126 10/26/18 0522  AST 95* 282*  ALT 63* 69*  ALKPHOS 81 83  BILITOT 0.6 1.2  PROT 5.8* 5.6*  ALBUMIN 2.9* 2.7*   No results for input(s): LIPASE, AMYLASE in the last 72 hours. Cardiac Enzymes Recent Labs    10/04/2018 1314 10/23/2018 1539 10/06/2018 2126  TROPONINI 0.07* 0.19* 0.94*   BNP: BNP (last 3 results) No results for input(s): BNP in the last 8760 hours.  ProBNP (last 3 results) No results for input(s): PROBNP in the last 8760 hours.  D-Dimer No results for input(s): DDIMER in the last 72 hours. Hemoglobin A1C No results for input(s): HGBA1C in the last 72 hours. Fasting  Lipid Panel No results for input(s): CHOL, HDL, LDLCALC, TRIG, CHOLHDL, LDLDIRECT in the last 72 hours. Thyroid Function Tests Recent Labs    10/19/2018 1539  TSH 2.349   Other results:  Imaging   Dg Chest Port 1 View  Result Date: 10/26/2018 CLINICAL DATA:  Central line placement. EXAM: PORTABLE CHEST 1 VIEW COMPARISON:  Radiograph of same day. FINDINGS: Stable cardiomegaly. Status post coronary artery bypass graft. Atherosclerosis of thoracic aorta is noted. Interval placement of left internal jugular catheter with distal tip in expected position of the SVC. No pneumothorax or significant pleural effusion is noted. Stable bilateral perihilar opacities are noted concerning for edema. IMPRESSION: Stable bilateral perihilar opacities concerning for edema. Interval placement of left internal jugular catheter with distal tip in expected position of the SVC. No pneumothorax is noted. Electronically Signed   By: Marijo Conception, M.D.   On: 10/26/2018 16:18     Medications:    Scheduled Medications: . allopurinol  300 mg Oral Daily  . aspirin  81 mg Oral Daily  . atorvastatin  80 mg Oral q1800  . carvedilol  3.125 mg Oral BID WC  . Chlorhexidine Gluconate Cloth  6 each Topical Daily  . folic acid  0.5 mg Oral Daily  . insulin aspart  0-9 Units Subcutaneous TID WC  . leflunomide  10 mg Oral Daily  . [START ON 10/30/2018] methotrexate  10 mg Oral Weekly  . pantoprazole  40 mg Oral Daily  . sodium chloride flush  10-40 mL Intracatheter Q12H  . sodium chloride flush  3 mL Intravenous Q12H  . ticagrelor  90 mg Oral BID     Infusions: . sodium chloride 10 mL/hr at 10/27/18 0600  . sodium chloride    . amiodarone 30 mg/hr (10/27/18 0600)  . milrinone 0.25 mcg/kg/min (10/27/18 0600)     PRN Medications:  sodium chloride, acetaminophen, LORazepam, ondansetron (ZOFRAN) IV, sodium chloride flush, sodium chloride flush  Patient Profile   [name] [name] is a 85 y.o. male with CAD s/p  CABG (1989 with redo in 2007) and PCI (2010) [as of 2010 LIMA ->LAD patent, SVG  to Ramus, PDA occluded, stent in native diagonal and in SVG ->diag patent], carotid disease s/p CEA, CKD III (Cr 1.5), ICM (EF 40-45%), and HTN.   [facility] with AMS (resolved) and subsequently suffered a VT/VF arrest with post-ROSC EKG demonstrative of STE in the anterior leads.  Assessment/Plan   1. ? STEMI c/b VT/VF arrest - Per Dr. Haroldine Laws, ? If arrest was primary VT in setting of worsening HF, with LV dysfunction out of proportion to CAD.  - s/p DES to LAD as above. - Remains on Bival, ASA, and Plavix - Pt states he would want Defib again if he needs it.   2. CAD s/p CABG x 2 - Cath 10/26/2018 showed known severe native and graft disease with essentially patent large diagonal branch and LIMA-LAD. Culprit lesion was 95% LAD after graft insertion. Treated with DES stent as below. - Cath reviewed by Dr. Haroldine Laws, lesion appears out of proportion to his degree of HF.   3. Acute on chronic systolic CHF; Primarily ischemic, but ? NICM component with gradual worsening over the past 1-2 years.  - In setting of STEMI as above - Echo 10/26/2018  EF down to 15%. Per Dr. Haroldine Laws, appears out of proportion to his CAD.  - He remains significantly orthopneic and on BiPAP.  - CVP 4-5. Holding lasix.   - Stop coreg with low output and ACS unlikely.  - Have asked CCM to place central access.  - Milrinone 0.25 mcg/kg/min started. Will check Coox and CVP once central access placed, to help guide.   4. Acute hypoxic respiratory failure - Remains on BiPAP. Pt would only want intubation as a last resort.  - Following closely.   5. AMS - He is currently awake and alert.  - MRI brain without acute abnormality - Carotid ultrasound without flow limiting stenosis - CT head negative - Suspect was in the context arrhythmia as mentioned above.  - Can consider EEG if neurology feels it is warranted,  but he appears stable at this time.   6. ? Afib, which would be new onset - Reported, but difficult to discern due to ectopy.  - Will review with MD. Difficult to discern if Afib or very frequent PAC/PVCs. Will discuss AC.  - CHA2DS2/VASc is at least 8.  - PPI while on triple therapy.    7. HTN - Holding home meds in setting of shock. MAPs remain low.   8. DM2 - Follow with SSI in house.   9. Hypokalemia - K 3.9 this am. Follow.   Medication concerns reviewed with patient and pharmacy team. Barriers identified: None at this time.   Length of Stay: 2  Annamaria Helling  10/27/2018, 8:06 AM  Advanced Heart Failure Team Pager [phone] (M-F; 7a - 4p)  Please contact [facility] for night-coverage after hours (4p -7a ) and weekends on [email or web address]  .Agree with above.    Remains severely tachypneic using accessory muscles to breathe. Central line placed. CVP 5. Co-ox 59% on milrinone 0.25. CXR suggests pulmonary edema but given low CVP we obtained chest CT. CT reviewed personally and shows diffuse aspiration PNA. Renal function worse and is now essentially anuric.   On exam Sitting up in bed  Uncomfortable and dyspneic LIJ TLC. No JVD Cor RRR Lungs diffuse crackles Ab soft NT Extremities warm no edema  He is critically ill. Suspect he has had progressive LV failure for probably a year and now admitted with end-stage HF complicated by  VT arrest. He is in marked respiratory distress this am and CT suggests PNA.   Long talk with family about options and pathway of aggressive care versus comfort care. With his severe LV dysfunction. I told them with aggressive care we were unlikely to get him any better than he was in September when he was already feeling quite debilitated.   They understand this and would like to proceed with short trial of aggressive care including intubation, broad spectrum abx and CRRT if needed. If he is not improving rapidly will switch to  comfort care.   D/w Dr. Nelda Marseille at bedside who will intubate.   CRITICAL CARE Performed by: Glori Bickers  Total critical care time: 45 minutes  Critical care time was exclusive of separately billable procedures and treating other patients.  Critical care was necessary to treat or prevent imminent or life-threatening deterioration.  Critical care was time spent personally by me (independent of midlevel providers or residents) on the following activities: development of treatment plan with patient and/or surrogate as well as nursing, discussions with consultants, evaluation of patient's response to treatment, examination of patient, obtaining history from patient or surrogate, ordering and performing treatments and interventions, ordering and review of laboratory studies, ordering and review of radiographic studies, pulse oximetry and re-evaluation of patient's condition.  Glori Bickers, MD  8:22 PM

## 2018-10-27 NOTE — Progress Notes (Addendum)
Initial Nutrition Assessment  DOCUMENTATION CODES:   Non-severe (moderate) malnutrition in context of chronic illness  INTERVENTION:   -Vital AF 1.2 @ 20 ml/hr via OGT -Increase by 10 ml Q8 hours to goal rate of 55 ml/hr (1320 ml) -30 ml Prostat BID  Provides: 1784 kcals, 129 grams protein, 1071 ml free water. Meets 105% calorie needs and 100% of protein needs.   NUTRITION DIAGNOSIS:   Moderate Malnutrition related to chronic illness(CHF/CAD) as evidenced by mild fat depletion, mild muscle depletion, moderate muscle depletion.  GOAL:   Patient will meet greater than or equal to 90% of their needs  MONITOR:   Diet advancement, Vent status, TF tolerance, Weight trends, Labs, I & O's  REASON FOR ASSESSMENT:   Consult Enteral/tube feeding initiation and management  ASSESSMENT:   Patient with PMH significant for CAD with s/p CABG/PCI, TIAs, CHF, CKD III, gout,, dyslipidemia, HTN, and prostate cancer. Presents this admission with AMS from unknown etiology.    1/29- VT/VF arrest post ROSC STEMI 1/30- underwent emergent cath (PCI with DES) 1/31- resp failure 2/2 to flash edema, intubated   Per nursing pt had one meal since admit as he has required BiPAP since cath. Pt has not had BM since admit, RN aware.   Per chart records pt looked to have weighed 177 lb on 01/13/18 and 165 lb this admission. Unable to determine how much is actual dry weight loss given CHF history. Will attempt to obtain nutrition history form family if possible.   Patient is currently intubated on ventilator support MV: 10.9 L/min Temp (24hrs), Avg:98.6 F (37 C), Min:97.6 F (36.4 C), Max:99.9 F (37.7 C) BP: 141/72 MAP: 88  I/O: + 857 ml since admit UOP: 515 ml x 24 hrs- per RN bladder scan showed 700 ml this afternoon, unable to pull back any urine  Medications reviewed and include: folic acid, SSI, precedex, amiodarone Labs reviewed: calcium ionized 1.08 (L)   NUTRITION - FOCUSED PHYSICAL  EXAM:    Most Recent Value  Orbital Region  Mild depletion  Upper Arm Region  Mild depletion  Thoracic and Lumbar Region  Unable to assess  Buccal Region  Unable to assess  Temple Region  Moderate depletion  Clavicle Bone Region  Moderate depletion  Clavicle and Acromion Bone Region  Moderate depletion  Scapular Bone Region  Unable to assess  Dorsal Hand  Mild depletion  Patellar Region  Moderate depletion  Anterior Thigh Region  Severe depletion  Posterior Calf Region  Moderate depletion  Edema (RD Assessment)  Mild     Diet Order:   Diet Order            Diet Heart Room service appropriate? Yes; Fluid consistency: Thin  Diet effective now              EDUCATION NEEDS:   Not appropriate for education at this time  Skin:  Skin Assessment: Reviewed RN Assessment  Last BM:  PTA  Height:   Ht Readings from Last 1 Encounters:  10/27/18 5\' 10"  (1.778 m)    Weight:   Wt Readings from Last 1 Encounters:  10/26/18 74.9 kg    Ideal Body Weight:  75.5 kg  BMI:  Body mass index is 23.69 kg/m.  Estimated Nutritional Needs:   Kcal:  1692 kcal  Protein:  115-130 grams   Fluid:  >/= 1.6 L/day   Mariana Single RD, LDN Clinical Nutrition Pager # - [phone]

## 2018-10-27 NOTE — Procedures (Signed)
OGT Placement By MD  OGT placed under direct laryngoscopy and verified by auscultation.  Cali Cuartas G. Tivon Lemoine, M.D. [facility]. Pager: [phone]. After hours pager: [phone]. 

## 2018-10-27 NOTE — Progress Notes (Signed)
RT obtained a repeat gas on pt d/t pts RR still being increased and pt has become more confused. ABG yielded the following results. RT will continue to monitor.    Ref. Range 10/27/2018 00:53  Sample type Unknown ARTERIAL  pH, Arterial Latest Ref Range: 7.350 - 7.450  7.490 (H)  pCO2 arterial Latest Ref Range: 32.0 - 48.0 mmHg 27.7 (L)  pO2, Arterial Latest Ref Range: 83.0 - 108.0 mmHg 68.0 (L)  TCO2 Latest Ref Range: 22 - 32 mmol/L 22  Acid-base deficit Latest Ref Range: 0.0 - 2.0 mmol/L 2.0  Bicarbonate Latest Ref Range: 20.0 - 28.0 mmol/L 21.1  O2 Saturation Latest Units: % 95.0  Patient temperature Unknown 98.4 F  Collection site Unknown RADIAL, ALLEN'S TEST ACCEPTABLE

## 2018-10-28 ENCOUNTER — Inpatient Hospital Stay (HOSPITAL_COMMUNITY): Payer: Medicare HMO

## 2018-10-28 LAB — HEPATIC FUNCTION PANEL
ALT: 31 U/L (ref 0–44)
AST: 80 U/L — ABNORMAL HIGH (ref 15–41)
Albumin: 2.7 g/dL — ABNORMAL LOW (ref 3.5–5.0)
Alkaline Phosphatase: 71 U/L (ref 38–126)
Bilirubin, Direct: 0.4 mg/dL — ABNORMAL HIGH (ref 0.0–0.2)
Indirect Bilirubin: 1.3 mg/dL — ABNORMAL HIGH (ref 0.3–0.9)
Total Bilirubin: 1.7 mg/dL — ABNORMAL HIGH (ref 0.3–1.2)
Total Protein: 5 g/dL — ABNORMAL LOW (ref 6.5–8.1)

## 2018-10-28 LAB — BASIC METABOLIC PANEL
Anion gap: 15 (ref 5–15)
BUN: 41 mg/dL — ABNORMAL HIGH (ref 8–23)
CHLORIDE: 99 mmol/L (ref 98–111)
CO2: 20 mmol/L — ABNORMAL LOW (ref 22–32)
Calcium: 7.9 mg/dL — ABNORMAL LOW (ref 8.9–10.3)
Creatinine, Ser: 4.74 mg/dL — ABNORMAL HIGH (ref 0.61–1.24)
GFR calc Af Amer: 12 mL/min — ABNORMAL LOW (ref >60)
GFR calc non Af Amer: 10 mL/min — ABNORMAL LOW (ref >60)
Glucose, Bld: 157 mg/dL — ABNORMAL HIGH (ref 70–99)
Potassium: 3.1 mmol/L — ABNORMAL LOW (ref 3.5–5.1)
Sodium: 134 mmol/L — ABNORMAL LOW (ref 135–145)

## 2018-10-28 LAB — MAGNESIUM
Magnesium: 2 mg/dL (ref 1.7–2.4)
Magnesium: 2 mg/dL (ref 1.7–2.4)

## 2018-10-28 LAB — COOXEMETRY PANEL
Carboxyhemoglobin: 1 % (ref 0.5–1.5)
METHEMOGLOBIN: 1.8 % — AB (ref 0.0–1.5)
O2 Saturation: 67.3 %
Total hemoglobin: 8 g/dL — ABNORMAL LOW (ref 12.0–16.0)

## 2018-10-28 LAB — POCT I-STAT 7, (LYTES, BLD GAS, ICA,H+H)
Acid-base deficit: 4 mmol/L — ABNORMAL HIGH (ref 0.0–2.0)
Bicarbonate: 20.3 mmol/L (ref 20.0–28.0)
Calcium, Ion: 1.05 mmol/L — ABNORMAL LOW (ref 1.15–1.40)
HCT: 22 % — ABNORMAL LOW (ref 39.0–52.0)
Hemoglobin: 7.5 g/dL — ABNORMAL LOW (ref 13.0–17.0)
O2 Saturation: 89 %
Patient temperature: 38.8
Potassium: 3.1 mmol/L — ABNORMAL LOW (ref 3.5–5.1)
Sodium: 134 mmol/L — ABNORMAL LOW (ref 135–145)
TCO2: 21 mmol/L — ABNORMAL LOW (ref 22–32)
pCO2 arterial: 35.2 mmHg (ref 32.0–48.0)
pH, Arterial: 7.377 (ref 7.350–7.450)
pO2, Arterial: 62 mmHg — ABNORMAL LOW (ref 83.0–108.0)

## 2018-10-28 LAB — CBC
HCT: 24.8 % — ABNORMAL LOW (ref 39.0–52.0)
HCT: 26.2 % — ABNORMAL LOW (ref 39.0–52.0)
HEMOGLOBIN: 7.8 g/dL — AB (ref 13.0–17.0)
Hemoglobin: 8.2 g/dL — ABNORMAL LOW (ref 13.0–17.0)
MCH: 27.5 pg (ref 26.0–34.0)
MCH: 27.2 pg (ref 26.0–34.0)
MCHC: 31.5 g/dL (ref 30.0–36.0)
MCHC: 31.3 g/dL (ref 30.0–36.0)
MCV: 87.3 fL (ref 80.0–100.0)
MCV: 87 fL (ref 80.0–100.0)
Platelets: 302 10*3/uL (ref 150–400)
Platelets: 274 10*3/uL (ref 150–400)
RBC: 2.84 MIL/uL — ABNORMAL LOW (ref 4.22–5.81)
RBC: 3.01 MIL/uL — ABNORMAL LOW (ref 4.22–5.81)
RDW: 16.6 % — ABNORMAL HIGH (ref 11.5–15.5)
RDW: 16.8 % — AB (ref 11.5–15.5)
WBC: 15.8 10*3/uL — ABNORMAL HIGH (ref 4.0–10.5)
WBC: 18 10*3/uL — ABNORMAL HIGH (ref 4.0–10.5)
nRBC: 0 % (ref 0.0–0.2)
nRBC: 0 % (ref 0.0–0.2)

## 2018-10-28 LAB — GLUCOSE, CAPILLARY
GLUCOSE-CAPILLARY: 261 mg/dL — AB (ref 70–99)
Glucose-Capillary: 133 mg/dL — ABNORMAL HIGH (ref 70–99)
Glucose-Capillary: 236 mg/dL — ABNORMAL HIGH (ref 70–99)
Glucose-Capillary: 222 mg/dL — ABNORMAL HIGH (ref 70–99)
Glucose-Capillary: 245 mg/dL — ABNORMAL HIGH (ref 70–99)
Glucose-Capillary: 291 mg/dL — ABNORMAL HIGH (ref 70–99)

## 2018-10-28 LAB — ANA W/REFLEX IF POSITIVE: Anti Nuclear Antibody(ANA): NEGATIVE

## 2018-10-28 LAB — POTASSIUM: POTASSIUM: 4.3 mmol/L (ref 3.5–5.1)

## 2018-10-28 LAB — PHOSPHORUS
Phosphorus: 2.2 mg/dL — ABNORMAL LOW (ref 2.5–4.6)
Phosphorus: 2.2 mg/dL — ABNORMAL LOW (ref 2.5–4.6)

## 2018-10-28 LAB — CORTISOL: Cortisol, Plasma: 19.6 ug/dL

## 2018-10-28 MED ORDER — HYDROCORTISONE NA SUCCINATE PF 100 MG IJ SOLR
50.0000 mg | Freq: Four times a day (QID) | INTRAMUSCULAR | Status: DC
  Administered 2018-10-28 – 2018-10-29 (×4): 50 mg via INTRAVENOUS
  Filled 2018-10-28 (×4): qty 2

## 2018-10-28 MED ORDER — PIPERACILLIN-TAZOBACTAM IN DEX 2-0.25 GM/50ML IV SOLN
2.2500 g | Freq: Three times a day (TID) | INTRAVENOUS | Status: DC
  Administered 2018-10-28 – 2018-10-29 (×3): 2.25 g via INTRAVENOUS
  Filled 2018-10-28 (×4): qty 50

## 2018-10-28 MED ORDER — POTASSIUM CHLORIDE 20 MEQ/15ML (10%) PO SOLN
40.0000 meq | Freq: Once | ORAL | Status: AC
  Administered 2018-10-28: 40 meq
  Filled 2018-10-28: qty 30

## 2018-10-28 MED ORDER — VASOPRESSIN 20 UNIT/ML IV SOLN
0.0300 [IU]/min | INTRAVENOUS | Status: DC
  Administered 2018-10-28 – 2018-10-29 (×2): 0.03 [IU]/min via INTRAVENOUS
  Filled 2018-10-28 (×2): qty 2

## 2018-10-28 NOTE — Consult Note (Signed)
[name] Admit Date: 10/10/2018 10/28/2018 Rexene Agent Requesting Physician:  Haroldine Laws MD  Reason for Consult:  AKI HPI:  85 year old male admitted 1/29 after presenting with a confusional state.  Shortly after admission patient developed VT arrest and was taken to the cardiac catheterization lab for STEMI where he received PCI to the LAD.  LVEF estimated at 15 to 20%.  Patient subsequent developed progressive respiratory failure requiring intubation.  Further, patient with aspiration pneumonia and septic shock with significant hypotension requiring high-dose norepinephrine and vasopressin.  Patient's baseline serum creatinine around 1.5.  He has developed anuretic progressive renal failure with a creatinine of 4.7.  Potassium is 3.1 and serum bicarbonate 20 with a BUN of 41 today.  Recent CT of the abdomen and pelvis without evidence of obstruction or hydronephrosis. UOP yesterday 80m, has foley catheter  Contrast exposure during cardiac catheterization on 1/29.  Patient received Toradol on 1/29.  PMH Incudes:  CAD with history of CABG in 1989 and repeat in 2007  History of TIAs  Gout  Rheumatoid arthritis on methotrexate  Hypertension  Type 2 diabetes on metformin   Creatinine, Ser (mg/dL)  Date Value  10/28/2018 4.74 (H)  10/27/2018 2.94 (H)  10/26/2018 1.97 (H)  10/26/2018 1.52 (H)  10/26/2018 1.45 (H)  10/09/2018 1.55 (H)  10/22/2018 1.58 (H)  09/09/2016 1.69 (H)  08/27/2016 1.57 (H)  06/25/2013 1.12  ] I/Os: I/O last 3 completed shifts: In: 4326.8 [P.O.:40; I.V.:2908; NG/GT:590.3; IV Piggyback:788.4] Out: 351 [Urine:276; Emesis/NG output:75]   ROS NSAIDS: Received 1/29 IV Contrast catheterization 1/29 TMP/SMX no exposure Hypotension inmixed shock currently Balance of 12 systems is negative w/ exceptions as above  PMH  Past Medical History:  Diagnosis Date  . CAD (coronary artery disease)    a. s/p  MI in 1988 and CABG 1989;  b. s/p redo CABG  2007;  c. s/p PCI DES to VG->PDA and native Diag;  d. 2010 Cath: occluded VG's to RI and PDA, patent LIMA->LAD, patent stent in diag->med Rx.  . Carotid arterial disease ([facility]    a. s/p bilat patch angioplasties;  b. 55/0539RICA 07-67% LICA 434-19% . Chronic systolic CHF (congestive heart failure) ([facility]    EF 10-15% 09/2018.  .Marland KitchenCKD (chronic kidney disease), stage III ([place]   . Gout   . History of TIAs 2000  . HTN (hypertension)   . Hypercholesteremia   . Ischemic cardiomyopathy    a. 03/2008 EF 30% noted on Myoview.  . Prostate cancer ([facility]   . Rheumatoid arthritis ([place]    [place] Past Surgical History:  Procedure Laterality Date  . BACK SURGERY    . CARDIOVASCULAR STRESS TEST  04-02-08   EF 30%  . CAROTID ENDARTERECTOMY  1992, 2007   LEFT, RIGHT  . CHOLECYSTECTOMY N/A 06/22/2013   Procedure: LAPAROSCOPIC CHOLECYSTECTOMY;  Surgeon: EGayland Curry MD;  [place] [facility]  Service: General;  Laterality: N/A;  . CORONARY ARTERY BYPASS GRAFT  [id number] . CORONARY/GRAFT ACUTE MI REVASCULARIZATION N/A 10/09/2018   Procedure: Coronary/Graft Acute MI Revascularization;  Surgeon: KTroy Sine MD;  [place];  Service: Cardiovascular;  Laterality: N/A;  . KNEE ARTHROSCOPY     RIGHT KNEE  . LEFT HEART CATH AND CORONARY ANGIOGRAPHY N/A 10/02/2018   Procedure: LEFT HEART CATH AND CORONARY ANGIOGRAPHY;  Surgeon: KTroy Sine MD;  [facility];  Service: Cardiovascular;  Laterality: N/A;  . RADIOACTIVE SEED IMPLANT  2005  FH  Family History  Problem Relation Age of Onset  . Other Father 57       CEREBRAL HEMORRHAGE  . Cancer Mother 36   [place]  reports that he quit smoking about 32 years ago. His smoking use included cigarettes. He has never used smokeless tobacco. He reports that he does not drink alcohol or use drugs. Allergies No Known Allergies Home medications Prior to Admission medications   Medication Sig Start Date End Date Taking? Authorizing  Provider  allopurinol (ZYLOPRIM) 300 MG tablet TAKE 1 TABLET BY MOUTH EVERY DAY 10/31/15  Yes [place], Peter M, MD  amLODipine (NORVASC) 10 MG tablet Take 10 mg by mouth daily.     Yes [provider]  aspirin 81 MG tablet Take 81 mg by mouth daily.     Yes [provider]  atorvastatin (LIPITOR) 40 MG tablet Take 40 mg by mouth 2 (two) times daily.    Yes [provider]  clopidogrel (PLAVIX) 75 MG tablet TAKE 1 TABLET BY MOUTH EVERY DAY 06/23/18  Yes [place], Peter M, MD  folic acid (FOLVITE) 0.5 MG tablet Take 0.5 mg by mouth daily.   Yes [provider]  hydrALAZINE (APRESOLINE) 25 MG tablet TAKE 1 TABLET BY MOUTH THREE TIMES A DAY 06/22/18  Yes [place], Peter M, MD  hydrochlorothiazide (MICROZIDE) 12.5 MG capsule Take 12.5 mg by mouth daily.   Yes [provider]  isosorbide mononitrate (IMDUR) 30 MG 24 hr tablet TAKE 1 TABLET BY MOUTH TWICE A DAY 08/14/18  Yes [place], Peter M, MD  ketoconazole (NIZORAL) 2 % shampoo See admin instructions. 04/24/18  Yes [provider]  leflunomide (ARAVA) 10 MG tablet Take 10 mg by mouth daily. 03/29/18  Yes [provider]  metFORMIN (GLUCOPHAGE-XR) 500 MG 24 hr tablet TAKE 1 TABLET BY MOUTH EVERY DAY WITH EVENING MEAL 03/09/18  Yes [provider]  metoprolol tartrate (LOPRESSOR) 25 MG tablet Take 12.5 mg by mouth 2 (two) times daily.     Yes [provider]  azithromycin (ZITHROMAX) 250 MG tablet Take 1 tablet by mouth 2 (two) times daily. 08/25/18   [provider]  desonide (DESOWEN) 0.05 % lotion Apply 1 application topically as needed.  08/13/18   [provider]  ergocalciferol (VITAMIN D2) 50000 UNITS capsule Take 50,000 Units by mouth every 30 (thirty) days. Twice monthly 1st and 15th    [provider]  methotrexate (RHEUMATREX) 2.5 MG tablet Take 4 tablets by mouth once a week. On mondays 08/18/18   [provider]  nitroGLYCERIN (NITROSTAT)  0.4 MG SL tablet Place 1 tablet (0.4 mg total) under the tongue every 5 (five) minutes as needed. 06/25/16   [place], Peter M, MD    Current Medications Scheduled Meds: . aspirin  81 mg Per Tube Daily  . atorvastatin  80 mg Per Tube q1800  . chlorhexidine gluconate (MEDLINE KIT)  15 mL Mouth Rinse BID  . Chlorhexidine Gluconate Cloth  6 each Topical Daily  . feeding supplement (PRO-STAT SUGAR FREE 64)  30 mL Per Tube BID  . fentaNYL (SUBLIMAZE) injection  50 mcg Intravenous Once  . hydrocortisone sod succinate (SOLU-CORTEF) inj  50 mg Intravenous Q6H  . insulin aspart  0-9 Units Subcutaneous Q4H  . mouth rinse  15 mL Mouth Rinse 10 times per day  . pantoprazole sodium  40 mg Per Tube Daily  . sodium chloride flush  10-40 mL Intracatheter Q12H  . sodium chloride flush  10-40  mL Intracatheter Q12H  . sodium chloride flush  3 mL Intravenous Q12H  . ticagrelor  90 mg Per Tube BID   Continuous Infusions: . sodium chloride 75 mL/hr at 10/28/18 1200  . sodium chloride 10 mL/hr at 10/28/18 1200  . amiodarone 30 mg/hr (10/28/18 1200)  . dexmedetomidine (PRECEDEX) IV infusion 1 mcg/kg/hr (10/28/18 1200)  . feeding supplement (VITAL AF 1.2 CAL) 30 mL/hr at 10/28/18 0440  . fentaNYL infusion INTRAVENOUS 100 mcg/hr (10/28/18 1200)  . linezolid (ZYVOX) IV Stopped (10/28/18 0704)  . norepinephrine (LEVOPHED) Adult infusion 60 mcg/min (10/28/18 1200)  . piperacillin-tazobactam (ZOSYN)  IV    . vasopressin (PITRESSIN) infusion - *FOR SHOCK* 0.03 Units/min (10/28/18 0904)   PRN Meds:.sodium chloride, acetaminophen (TYLENOL) oral liquid 160 mg/5 mL, fentaNYL, LORazepam, ondansetron (ZOFRAN) IV, sodium chloride flush, sodium chloride flush, sodium chloride flush  CBC Recent Labs  Lab 10/12/2018 1314 10/19/2018 2126  10/26/18 0522  10/27/18 0500 10/27/18 1719 10/28/18 0411 10/28/18 0413  WBC 6.6 7.0  --  10.1  --  15.1*  --  15.8*  --   NEUTROABS 5.0 4.2  --   --   --   --   --   --   --   HGB  11.3* 10.9*   < > 10.6*   < > 8.6* 9.2* 7.8* 7.5*  HCT 36.4* 35.5*   < > 34.4*   < > 26.8* 27.0* 24.8* 22.0*  MCV 88.3 89.6  --  87.5  --  87.6  --  87.3  --   PLT 214 288  --  288  --  274  --  302  --    < > = values in this interval not displayed.   Basic Metabolic Panel Recent Labs  Lab 10/19/2018 1314 10/12/2018 2126 10/26/18 0219  10/26/18 0522  10/26/18 1550 10/26/18 2041 10/27/18 0053 10/27/18 0500 10/27/18 1719 10/27/18 1938 10/28/18 0411 10/28/18 0413  NA 136 134* 133*   < > 134*   < > 134* 134* 135 138 134*  --  134* 134*  K 3.1* 3.1* 3.2*   < > 3.2*   < > 4.9 3.9 3.8 3.9 3.9  --  3.1* 3.1*  CL 102 100 100  --  101  --  103  --   --  106  --   --  99  --   CO2 22 21* 18*  --  19*  --  21*  --   --  21*  --   --  20*  --   GLUCOSE 208* 189* 210*  --  232*  --  162*  --   --  173*  --   --  157*  --   BUN 27* 24* 24*  --  24*  --  26*  --   --  32*  --   --  41*  --   CREATININE 1.58* 1.55* 1.45*  --  1.52*  --  1.97*  --   --  2.94*  --   --  4.74*  --   CALCIUM 8.3* 8.2* 8.1*  --  8.1*  --  8.2*  --   --  8.2*  --   --  7.9*  --   PHOS  --   --   --   --   --   --   --   --   --   --   --  2.3* 2.2*  --    < > =  values in this interval not displayed.    Physical Exam  Blood pressure (!) 119/54, pulse 94, temperature 100.2 F (37.9 C), resp. rate 15, height '5\' 10"'  (1.778 m), weight 80 kg, SpO2 99 %. GEN: Elderly male, intubated, sedated ENT: ET tube in place EYES: Eyes closed CV: Regular, normal rate, S1-S2 present, no rub PULM: Coarse breath sounds bilaterally ABD: Soft, nontender SKIN: No rashes or lesions EXT: No significant edema  Assessment 7M with anuric AKI from ATN related to contrast, shock.  Overall status is very grim given advanced age, progressive shock, pneumonia, recent cardiac arrest with STEMI and cardiogenic shock.  At length I talked with family, wife and daughter, and patient is at best very tenuous for CRRT given current hemodynamics; further,  it is unlikely that he will survive with or without CRRT.  Thankfully, no immediate indications to initiate, potassium is stable and volume status is not an issue.  1. Anuric AKI, ATN from CIN, Shock 2. Septic / Cardiogenic Shock 3. STEMI s/p PCI 1/29 to LAD 4. A on C sCHF LVEF 15-20% 5. Aspiration PNA; linezolid / zosyn 6. VDRF 2/2 #2,3,4 7. RA, immunosuppressed as outpt on MTX 8. Anemia  Plan 1. No immediate CRRT, will cont to dialogue with family 2. D/w PCCM and AHF 3. Cont aggressive supportive care   Pearson Grippe MD 10/28/2018, 12:08 PM

## 2018-10-28 NOTE — Progress Notes (Addendum)
Advanced Heart Failure Rounding Note  PCP-Cardiologist: No primary care provider on file.   Subjective:    Intubated on 1/31. Remains on vent. On NE 50 and milrinone 0.25. SBP 60-70.   Anuric with < 100 cc urine out. Creatinine up to 4.7. Weight up 5 pounds.   On broad spectrum abx for aspiration PNA    Echo 10/26/2018 15-20%  LHC 10/26/2018  Prox RCA to Mid RCA lesion is 100% stenosed.  Ost Cx to Prox Cx lesion is 100% stenosed.  Ost 1st Diag to 1st Diag lesion is 5% stenosed.  Prox LAD lesion is 60% stenosed.  Mid LAD-1 lesion is 80% stenosed.  Mid LAD-2 lesion is 90% stenosed.  Post intervention, there is a 0% residual stenosis in LAD.   A stent was successfully placed to LAD.   Objective:   Weight Range: 80 kg Body mass index is 25.31 kg/m.   Vital Signs:   Temp:  [97.6 F (36.4 C)-102.7 F (39.3 C)] 99.9 F (37.7 C) (02/01 0800) Pulse Rate:  [60-131] 77 (02/01 0800) Resp:  [14-47] 18 (02/01 0800) BP: (84-128)/(22-96) 99/83 (02/01 0800) SpO2:  [88 %-100 %] 97 % (02/01 0806) Arterial Line BP: (77-136)/(24-67) 89/34 (02/01 0800) FiO2 (%):  [40 %-100 %] 40 % (02/01 0806) Weight:  [80 kg] 80 kg (02/01 0600) Last BM Date: 10/18/18  Weight change: Filed Weights   10/15/2018 2215 10/26/18 0307 10/28/18 0600  Weight: 75.9 kg 74.9 kg 80 kg   Intake/Output:   Intake/Output Summary (Last 24 hours) at 10/28/2018 0818 Last data filed at 10/28/2018 0700 Gross per 24 hour  Intake 3645.85 ml  Output 146 ml  Net 3499.85 ml    Physical Exam   General:  Intubated. Sedated  HEENT: normal + ETT Neck: Supple LIJ TLC  Cor: PMI nondisplaced. Regular rate & rhythm. No rubs, gallops or murmurs. Lungs: diffuse rhonchi Abdomen: soft, nontender, nondistended. No hepatosplenomegaly. No bruits or masses. Good bowel sounds. Extremities: no cyanosis, clubbing, rash, tr edema Neuro: intubated sedated  Telemetry   NSR 90 frequent PVCs and NSVT Personally  reviewed    Labs    CBC Recent Labs    10/10/2018 1314 10/20/2018 2126  10/27/18 0500  10/28/18 0411 10/28/18 0413  WBC 6.6 7.0   < > 15.1*  --  15.8*  --   NEUTROABS 5.0 4.2  --   --   --   --   --   HGB 11.3* 10.9*   < > 8.6*   < > 7.8* 7.5*  HCT 36.4* 35.5*   < > 26.8*   < > 24.8* 22.0*  MCV 88.3 89.6   < > 87.6  --  87.3  --   PLT 214 288   < > 274  --  302  --    < > = values in this interval not displayed.   Basic Metabolic Panel Recent Labs    10/27/18 0500  10/27/18 1938 10/28/18 0411 10/28/18 0413  NA 138   < >  --  134* 134*  K 3.9   < >  --  3.1* 3.1*  CL 106  --   --  99  --   CO2 21*  --   --  20*  --   GLUCOSE 173*  --   --  157*  --   BUN 32*  --   --  41*  --   CREATININE 2.94*  --   --  4.74*  --  CALCIUM 8.2*  --   --  7.9*  --   MG 2.2  --  2.1 2.0  --   PHOS  --   --  2.3* 2.2*  --    < > = values in this interval not displayed.   Liver Function Tests Recent Labs    10/26/18 0522 10/28/18 0411  AST 282* 80*  ALT 69* 31  ALKPHOS 83 71  BILITOT 1.2 1.7*  PROT 5.6* 5.0*  ALBUMIN 2.7* 2.7*   No results for input(s): LIPASE, AMYLASE in the last 72 hours. Cardiac Enzymes Recent Labs    10/16/2018 1314 10/16/2018 1539 10/08/2018 2126  TROPONINI 0.07* 0.19* 0.94*   BNP: BNP (last 3 results) No results for input(s): BNP in the last 8760 hours.  ProBNP (last 3 results) No results for input(s): PROBNP in the last 8760 hours.  D-Dimer No results for input(s): DDIMER in the last 72 hours. Hemoglobin A1C No results for input(s): HGBA1C in the last 72 hours. Fasting Lipid Panel No results for input(s): CHOL, HDL, LDLCALC, TRIG, CHOLHDL, LDLDIRECT in the last 72 hours. Thyroid Function Tests Recent Labs    10/15/2018 1539  TSH 2.349   Other results:  Imaging   Ct Abdomen Pelvis Wo Contrast  Result Date: 10/27/2018 CLINICAL DATA:  Abdominal swelling.  Suspect ascites. EXAM: CT ABDOMEN AND PELVIS WITHOUT CONTRAST TECHNIQUE: Multidetector  CT imaging of the abdomen and pelvis was performed following the standard protocol without IV contrast. COMPARISON:  CT, 03/18/2017. Current chest radiographs and chest CT. FINDINGS: Lower chest: Ground-glass and dense consolidation is noted at the lung bases, most confluent dependently. There changes from cardiac surgery. Hepatobiliary: Liver normal in size and attenuation. No mass or focal lesion. Gallbladder surgically absent. No bile duct dilation. Pancreas: Unremarkable. No pancreatic ductal dilatation or surrounding inflammatory changes. Spleen: Spleen normal in size. There are splenic calcifications consistent with healed granuloma. No masses. Adrenals/Urinary Tract: Intermediate attenuation 17 mm right adrenal mass similar to the prior CT. Normal left adrenal gland. Left greater than right renal cortical thinning. Numerous bilateral low-attenuation homogeneous renal masses consistent with cysts. Largest arises from the lower pole the right kidney measuring 14 cm. No stones. No hydronephrosis. Ureters are normal in course and in caliber. Bladder is decompressed with a Foley catheter. Stomach/Bowel: Stomach decompressed with a nasal/orogastric tube. Stomach otherwise unremarkable. Small bowel and colon are normal in caliber. No wall thickening or inflammation. Normal appendix visualized. Vascular/Lymphatic: Diffuse aortic atherosclerosis. Mild ectasia. Maximum AP diameter of the infrarenal abdominal aorta is 2.5 cm. No lymphadenopathy. Reproductive: Multiple radiation therapy seeds lie within prostate and prostate bed, stable. Other: No ascites.  No abdominal wall hernia. Musculoskeletal: No fracture or acute finding. No osteoblastic or osteolytic lesions. IMPRESSION: 1. No acute findings within the abdomen or pelvis. 2. No ascites.  No bowel distention. 3. Airspace opacities at both lung bases which for more comprehensively evaluated on the current chest CT. 4. Multiple renal cysts. 5. Aortic atherosclerosis  and ectasia. Ectatic abdominal aorta at risk for aneurysm development. Recommend followup by ultrasound in 5 years. This recommendation follows ACR consensus guidelines: White Paper of the ACR Incidental Findings Committee II on Vascular Findings. J Am Coll Radiol 2013; 10:789-794. Aortic aneurysm NOS (ICD10-I71.9) Electronically Signed   By: Lajean Manes M.D.   On: 10/27/2018 18:53   Ct Chest Wo Contrast  Result Date: 10/27/2018 CLINICAL DATA:  85 year old male with respiratory distress. Status post CPR. The patient has undergone cardiac catheterization/treatment with contrast  dosed administered 09/28/2018 EXAM: CT CHEST WITHOUT CONTRAST TECHNIQUE: Multidetector CT imaging of the chest was performed following the standard protocol without IV contrast. COMPARISON:  Plain film 10/26/2018, 10/26/2018, CT chest 10/26/2016 FINDINGS: Cardiovascular: Cardiomegaly. Dense calcifications of left main, left anterior descending, circumflex, right coronary artery. Surgical changes of median sternotomy and CABG. Normal course caliber contour of the thoracic aorta with dense calcifications of ascending aorta, arch, and descending aorta. No pericardial fluid/thickening. Mediastinum/Nodes: Median sternotomy. No mediastinal adenopathy. Calcified right hilar/bronchial lymph nodes. Unremarkable course of the thoracic esophagus. No supraclavicular adenopathy. Lungs/Pleura: Confluent airspace disease in the dependent aspects of the bilateral upper lobes, with additional nodular and confluent opacity in the peribronchovascular distribution of the bilateral lower lobes. There is also nodular opacity in the dependent regions of the right middle lobe. No pneumothorax. No interlobular septal thickening. No pleural effusions. Upper Abdomen: Persistent nephrogram of the bilateral kidneys with contrast dose administered 10/06/2018. No hydronephrosis of the visualized kidneys. Musculoskeletal: Minimally displaced anterior right fifth rib  fracture. Osteopenia.  Degenerative changes of the spine. IMPRESSION: Multifocal airspace opacity of the bilateral lungs, predominantly dependently located along the peribronchial vasculature of all lobes. This is favored to be related to aspiration pneumonitis/pneumonia, and/or sequela of CPR with contusion. Edema is not favored given the absence of interlobular septal thickening and effusions. Of note, there is persistent nephrogram of the visualized kidneys, with the contrast dosed administered during cardiac intervention 10/22/2018. Given the persistence, this could represent acute renal failure/ARI/contrast induced nephropathy. Cardiomegaly, aortic atherosclerosis, native coronary disease, and surgical changes of median sternotomy/CABG. Aortic Atherosclerosis (ICD10-I70.0). Acute minimally displaced anterior right fifth rib fracture. Electronically Signed   By: Corrie Mckusick D.O.   On: 10/27/2018 11:36   Dg Chest Port 1 View  Result Date: 10/28/2018 CLINICAL DATA:  Endotracheal tube position. EXAM: PORTABLE CHEST 1 VIEW COMPARISON:  Radiograph of October 27, 2018. FINDINGS: Stable cardiomediastinal silhouette. Atherosclerosis of thoracic aorta is noted. Status post coronary artery bypass graft. Endotracheal and nasogastric tubes are unchanged in position. Left internal jugular catheter is unchanged in position. No pneumothorax or significant pleural effusion is noted. Stable bilateral lung opacities are noted concerning for possible pneumonia. Bony thorax is unremarkable. IMPRESSION: Stable support apparatus. Stable bilateral lung opacities are noted concerning for possible pneumonia. Aortic Atherosclerosis (ICD10-I70.0). Electronically Signed   By: Marijo Conception, M.D.   On: 10/28/2018 08:05   Dg Chest Port 1 View  Result Date: 10/27/2018 CLINICAL DATA:  Intubation and orogastric tube placement. EXAM: PORTABLE CHEST 1 VIEW COMPARISON:  CT chest October 27, 2018. FINDINGS: Endotracheal tube tip projects  5 cm above the carina. Nasogastric tube past GE junction, side port projects in distal esophagus, distal tip out of field of view. LEFT internal jugular central venous catheter distal tip projects in mid superior vena cava. Interstitial and alveolar airspace opacities. No pleural effusion. Cardiac silhouette is mildly enlarged. Calcified aortic arch. No pneumothorax. IMPRESSION: 1. Endotracheal tube tip projects 5 cm above the carina. Nasogastric tube side port projects in distal esophagus. LEFT internal jugular central venous catheter distal tip projects in mid superior vena cava. No pneumothorax. 2. Interstitial and alveolar airspace opacities most compatible with pneumonia, seen on today's CT. 3. Cardiomegaly. 4.  Aortic Atherosclerosis (ICD10-I70.0). Electronically Signed   By: Elon Alas M.D.   On: 10/27/2018 14:35    Medications:    Scheduled Medications: . allopurinol  300 mg Per Tube Daily  . aspirin  81 mg Per Tube Daily  .  atorvastatin  80 mg Per Tube q1800  . chlorhexidine gluconate (MEDLINE KIT)  15 mL Mouth Rinse BID  . Chlorhexidine Gluconate Cloth  6 each Topical Daily  . feeding supplement (PRO-STAT SUGAR FREE 64)  30 mL Per Tube BID  . fentaNYL (SUBLIMAZE) injection  50 mcg Intravenous Once  . folic acid  0.5 mg Per Tube Daily  . insulin aspart  0-9 Units Subcutaneous Q4H  . leflunomide  10 mg Per Tube Daily  . mouth rinse  15 mL Mouth Rinse 10 times per day  . [START ON 10/30/2018] methotrexate  10 mg Oral Weekly  . pantoprazole sodium  40 mg Per Tube Daily  . sodium chloride flush  10-40 mL Intracatheter Q12H  . sodium chloride flush  10-40 mL Intracatheter Q12H  . sodium chloride flush  3 mL Intravenous Q12H  . ticagrelor  90 mg Per Tube BID    Infusions: . sodium chloride Stopped (10/28/18 0604)  . sodium chloride Stopped (10/28/18 0605)  . amiodarone 30 mg/hr (10/28/18 0700)  . dexmedetomidine (PRECEDEX) IV infusion 1 mcg/kg/hr (10/28/18 0700)  . feeding  supplement (VITAL AF 1.2 CAL) 30 mL/hr at 10/28/18 0440  . fentaNYL infusion INTRAVENOUS 125 mcg/hr (10/28/18 0700)  . linezolid (ZYVOX) IV 300 mL/hr at 10/28/18 0700  . norepinephrine (LEVOPHED) Adult infusion 50 mcg/min (10/28/18 0700)  . piperacillin-tazobactam (ZOSYN)  IV 12.5 mL/hr at 10/28/18 0700  . vasopressin (PITRESSIN) infusion - *FOR SHOCK*      PRN Medications: sodium chloride, acetaminophen (TYLENOL) oral liquid 160 mg/5 mL, fentaNYL, LORazepam, ondansetron (ZOFRAN) IV, sodium chloride flush, sodium chloride flush, sodium chloride flush  Patient Profile   [name] [name] is a 85 y.o. male with CAD s/p CABG (1989 with redo in 2007) and PCI (2010) [as of 2010 LIMA ->LAD patent, SVG to Ramus, PDA occluded, stent in native diagonal and in SVG ->diag patent], carotid disease s/p CEA, CKD III (Cr 1.5), ICM (EF 40-45%), and HTN.   Presented to [facility] 09/27/2018 with AMS (resolved) and subsequently suffered a VT/VF arrest with post-ROSC EKG demonstrative of STE in the anterior leads.  Assessment/Plan   1. Shock likely mixed cardiogenic/septic - Admitted with VT arrest and mild NSTEMI (trop 0.90).  - CT chest 1/31 c/w aspiration PNA - BP low today  Despite NE 50 and milrinone 0.25 - Co-ox 67% - Suspect sepsis is main issue.  - Stop milrinone. Continue NE. Add vasopressin - Hold RA immunosupressants - Start stress-dose steroids  2. VT/VF arrest - Degree of LV dysfunction out of proportion to CAD. Suspect priamry VT in setting of worsening LV function - Continue amio.  - Keep K> 4.0 Mg > 2.0  3. NSTEMI with CAD s/p CABG x 2 - Cath 10/26/2018 showed known severe native and graft disease with essentially patent large diagonal branch and LIMA-LAD. Culprit lesion was 95% LAD after graft insertion. Treated with DES to LAD - Continue DAPT  4. Acute on chronic systolic CHF; Primarily ischemic, but ? NICM component with gradual worsening over the past 1-2 years.  - Echo  10/26/2018  EF 15% (Was 40-45% in 2014)  - Now intubated and anuric - Co-ox 67% on NE 50 and milrinone 0.25 Stop milrinone. Add vasopressin for septic component - Will likely need CVVHD soon  5. Acute hypoxic respiratory failure, Due to aspiration PNA - Continue zosyn/linazelid  6. AKI due to ATN/VT arrest - Baseline 1.5 - now anuric. Creatinine up to 4.7 - Will likly need CVVHD  soon - Will need Renal Consult.   7. DM2 - Follow with SSI in house.   8. Hypokalemia - K 3.1 this am. Follow.   9. Anemia - appears acute on chronic - no obvious sites of bleeding - Hgb 10.5 ->-> 7.5 - transfuse keep hgb 7.5 or greater.  - IV protonix  Overall very concerning picture with MSOF and now mixed septic/cardiogenic shock with aspiration PNA on top of VT arrest. C/b AKI.   Will adjust pressors. Consult Renal for possible CRT (once BP stabilized). Continue vent support. Will transfuse as needed. Supp K.   I think we are moving closer to comfort care.   CRITICAL CARE Performed by: Glori Bickers  Total critical care time: 45 minutes  Critical care time was exclusive of separately billable procedures and treating other patients.  Critical care was necessary to treat or prevent imminent or life-threatening deterioration.  Critical care was time spent personally by me (independent of midlevel providers or residents) on the following activities: development of treatment plan with patient and/or surrogate as well as nursing, discussions with consultants, evaluation of patient's response to treatment, examination of patient, obtaining history from patient or surrogate, ordering and performing treatments and interventions, ordering and review of laboratory studies, ordering and review of radiographic studies, pulse oximetry and re-evaluation of patient's condition.     Length of Stay: 3  Glori Bickers, MD  10/28/2018, 8:18 AM  Advanced Heart Failure Team Pager [phone] (M-F; 7a - 4p)   Please contact [facility] for night-coverage after hours (4p -7a ) and weekends on [email or web address]

## 2018-10-28 NOTE — Progress Notes (Signed)
NAME:  [name] [name], MRN:  [id number], DOB:  1931/12/07, LOS: 3 ADMISSION DATE:  10/12/2018, CONSULTATION DATE:  10/26/2018 REFERRING MD:  Dr. Ellyn Hack, CHIEF COMPLAINT:  Cardiogenic Shock/V. tach arrest  Brief History   [name] [name] is an 85 year old gentleman with CAD status post CABG, PCI, carotid artery disease status post CEA, CKD stage III, ischemic cardiomyopathy (EF 40 to 45%) who initially presented with altered mental status however went into V. tach/V. fib arrest with post ROSC EKG showing ST elevation in anterior leads.  Underwent PCI with DES and found to be in cardiogenic shock with flash pulmonary edema and significantly reduced EF 15 to 20%.  History of present illness   [name] [name] is an 85 year old gentleman with CAD status post CABG and PCI, carotid artery stenosis status post CEA, CKD stage III, ischemic cardiomyopathy (EF 40 to 45%), hypertension, hyperlipidemia, atrial fibrillation who initially presented to [facility] on 09/28/2018 with acute encephalopathy.  He was evaluated by neurology who did not think the etiology of his AMS was TIA or CVA.  Soon after admission he went into V. tach/V. fib arrest and attained ROSC after 1 minute of CPR and cardioversion.  Post resuscitation EKG revealed ST elevations in the anterior lateral leads and was urgently transported to [facility] for cardiac cardiac catheterization.  While in the lab he was found to have 95% occlusion of the LAD which was treated with DES stent.   Following cardiac catheterization, he was noted to be hypertensive with SBP 150-180s as well as flash pulmonary edema on CXR. He was placed on BiPAP and diuresis was started.  Repeat echocardiogram showed reduced EF of 15 to 20%.   Past Medical History  Coronary artery disease status post CABG, PCI Carotid artery disease status post CEA CKD stage III Ischemic cardiomyopathy (EF 40 to 45%) decreased 10 to 15% on 10/26/2018 Hypertension New onset atrial  fibrillation Chronic gout Hyperlipidemia Type 2 diabetes mellitus Rheumatoid arthritis  Significant Hospital Events   1/29 admission to [facility] with AMS> 1/29 VT/V. tach arrest> 1/29 post arrest EKG with ST elevations in anterior lateral leads> 1/30 PCI with DES of LAD> 1/30 hypertensive, pulmonary edema, repeat echo with EF 15 to 20%> 1/30 PCCM consult>  10/27/2018 family would want intubation short-term to give him adequate amount of time to recover from this event.  Consults:  Interventional cardiology Advanced heart failure team  Procedures:  1/30 PCI with DES of LAD> 10/26/2018 central line placement>>  Significant Diagnostic Tests:  1/30 echocardiogram> EF 10 to 15% severely reduced systolic function CT chest 1/31 space multifocal airspace opacity bilaterally lungs, persistent nephrogram of the visualized kidneys CT abdomen/pelvis 131 space no acute findings of the abdomen or pelvis, no ascites or bowel distention.  Bibasilar airspace opacities.  Ectatic abdominal aorta   Micro Data:  2/1 BCX2>> 2/1 Tracheal asp>>  Antimicrobials:  2/1 Zosyn 2/1 Zyvox  Interval Hx /Subjective  2/1 -remains hypotensive despite aggressive pressor support  Stress dose steroids added, Milnirone changed to vasopressin this am.  Increased abdominal distention.  High tube feed residuals Updated family at bedside Fever, T-max 1027  Objective   Blood pressure 99/83, pulse 77, temperature 99.9 F (37.7 C), resp. rate 18, height 5\' 10"  (1.778 m), weight 80 kg, SpO2 97 %. CVP:  [4 mmHg-7 mmHg] 7 mmHg  Vent Mode: PRVC FiO2 (%):  [40 %-100 %] 40 % Set Rate:  [18 bmp] 18 bmp Vt Set:  [580 mL]  580 mL PEEP:  [5 cmH20] 5 cmH20 Plateau Pressure:  [10 cmH20-19 cmH20] 12 cmH20   Intake/Output Summary (Last 24 hours) at 10/28/2018 0901 Last data filed at 10/28/2018 0700 Gross per 24 hour  Intake 3620.51 ml  Output 146 ml  Net 3474.51 ml   Filed Weights   10/03/2018 2215 10/26/18  0307 10/28/18 0600  Weight: 75.9 kg 74.9 kg 80 kg    Examination: General: Chronically ill-appearing male sedated on vent HEENT: ETT in place Neuro: Sedated on vent , does not follow commands  CV: Irregular, grade 1/6 systolic murmur  PULM: Breath sounds bilaterally  GI: Distended hypoactive bowel sounds Extremities: Warm/trace to 1+ edema Skin: No rashes   Resolved Hospital Problem list     Assessment & Plan:  [name] [name] is an 85 year old gentleman with CAD status post CABG, PCI, carotid artery disease status post CEA, CKD stage III, ischemic cardiomyopathy (EF 40 to 45%) who initially presented with altered mental status however went into V. tach/V. fib arrest with post ROSC EKG showing ST elevation in anterior leads.  Underwent PCI with DES and found to be in cardiogenic shock with flash pulmonary edema and significantly reduced EF 15 to 20%.  10/27/2018 family want short-term intubation if needed after he is been on noninvasive mechanical ventilatory support for the last 48 hours.  #Cardiogenic shock #STEMI s/p PCI with DES to mLAD His ventricular fibrillation ventricular tachycardia arrest was thought to be ischemic given his known history of severe CAD with previous invasive interventions. Cardiology is following Currently on amiodarone/Norepi /Vaso  2D echo performed on 130 shows decrease systolic function of 10 to 15% Short-term intubation may be needed to decrease workload of the heart     #Acute hypoxic respiratory failure Probable Aspiration PNA /Septic Shorck  10/27/2018 currently 48 hours on BiPAP he has been taken off BiPAP.  After discussion with family should he acute respiratory failure he will be intubated for short-term but no tracheostomy PEG or skilled nursing facilities as an option..  Cont Vent support  VAP  IV abx with Zyvox and Zosyn  Cx pending  Stress dose steroids 2/1  Cortisol pending   #CAD s/p CABG, PCI Status post PCI with DES  10/26/2018 Cardiology is following  #?New onset atrial fibrillation Cont on amiodarone drip  #Hypertension Monitor Antihypertensives on hold   #Diabetes mellitus Sliding scale insulin protocol  #CKD stage III/Acute on Chronic Renal Failure /Anuric  Renal fxn continues to worsen   Renal consult pending   #Carotid artery stenosis status post CEA Continue ASA Brilinta  #Hyperlipidemia Continue Lipitor  #Rheumatoid arthritis Hold ARAVA   #Chronic gout Stop Allopurinol for now   #Anemia  Transfuse for hbg >7.5   Abd distention/High TF residuals.  Tube feeds on hold 2/1   Hypokalemia  K replaced   Best practice:  Diet: NPO -TF on hold 2/1  Pain/Anxiety/Delirium protocol (if indicated): Sedation protocol  VAP protocol (if indicated): VAP  DVT prophylaxis: SCDs GI prophylaxis: P.o. Protonix Glucose control: SSI Mobility: Bedrest Code Status: 10/27/2018 wife and children at bedside and agreed to short-term intubation if needed. Family Communication: Wife and children updated at bedside 10/27/2018, 2/1  Disposition: Remain in ICU  Labs   CBC: Recent Labs  Lab 09/28/2018 1314 10/06/2018 2126  10/26/18 0522  10/27/18 0053 10/27/18 0500 10/27/18 1719 10/28/18 0411 10/28/18 0413  WBC 6.6 7.0  --  10.1  --   --  15.1*  --  15.8*  --  NEUTROABS 5.0 4.2  --   --   --   --   --   --   --   --   HGB 11.3* 10.9*   < > 10.6*   < > 8.5* 8.6* 9.2* 7.8* 7.5*  HCT 36.4* 35.5*   < > 34.4*   < > 25.0* 26.8* 27.0* 24.8* 22.0*  MCV 88.3 89.6  --  87.5  --   --  87.6  --  87.3  --   PLT 214 288  --  288  --   --  274  --  302  --    < > = values in this interval not displayed.    Basic Metabolic Panel: Recent Labs  Lab 10/26/18 0219  10/26/18 0522  10/26/18 1550  10/27/18 0053 10/27/18 0500 10/27/18 1719 10/27/18 1938 10/28/18 0411 10/28/18 0413  NA 133*   < > 134*   < > 134*   < > 135 138 134*  --  134* 134*  K 3.2*   < > 3.2*   < > 4.9   < > 3.8 3.9 3.9  --  3.1*  3.1*  CL 100  --  101  --  103  --   --  106  --   --  99  --   CO2 18*  --  19*  --  21*  --   --  21*  --   --  20*  --   GLUCOSE 210*  --  232*  --  162*  --   --  173*  --   --  157*  --   BUN 24*  --  24*  --  26*  --   --  32*  --   --  41*  --   CREATININE 1.45*  --  1.52*  --  1.97*  --   --  2.94*  --   --  4.74*  --   CALCIUM 8.1*  --  8.1*  --  8.2*  --   --  8.2*  --   --  7.9*  --   MG 2.7*  --  2.5*  --   --   --   --  2.2  --  2.1 2.0  --   PHOS  --   --   --   --   --   --   --   --   --  2.3* 2.2*  --    < > = values in this interval not displayed.   GFR: Estimated Creatinine Clearance: 12 mL/min (A) (by C-G formula based on SCr of 4.74 mg/dL (H)). Recent Labs  Lab 10/02/2018 2126 10/26/18 0513 10/26/18 0522 10/26/18 0802 10/27/18 0500 10/28/18 0411  WBC 7.0  --  10.1  --  15.1* 15.8*  LATICACIDVEN  --  1.9  --  1.8  --   --     Liver Function Tests: Recent Labs  Lab 10/19/2018 1314 10/27/2018 2126 10/26/18 0522 10/28/18 0411  AST 34 95* 282* 80*  ALT 16 63* 69* 31  ALKPHOS 83 81 83 71  BILITOT 1.0 0.6 1.2 1.7*  PROT 6.0* 5.8* 5.6* 5.0*  ALBUMIN 3.0* 2.9* 2.7* 2.7*   No results for input(s): LIPASE, AMYLASE in the last 168 hours. No results for input(s): AMMONIA in the last 168 hours.  ABG    Component Value Date/Time   PHART 7.377 10/28/2018 0413   PCO2ART 35.2 10/28/2018  0413   PO2ART 62.0 (L) 10/28/2018 0413   HCO3 20.3 10/28/2018 0413   TCO2 21 (L) 10/28/2018 0413   ACIDBASEDEF 4.0 (H) 10/28/2018 0413   O2SAT 67.3 10/28/2018 0416     Coagulation Profile: Recent Labs  Lab 10/09/2018 1314  INR 1.08    Cardiac Enzymes: Recent Labs  Lab 09/30/2018 1314 10/10/2018 1539 10/02/2018 2126  TROPONINI 0.07* 0.19* 0.94*    HbA1C: Hgb A1c MFr Bld  Date/Time Value Ref Range Status  04/09/2009 11:35 PM  4.6 - 6.1 % Final   6.0 (NOTE) The ADA recommends the following therapeutic goal for glycemic control related to Hgb A1c measurement: Goal of  therapy: <6.5 Hgb A1c  Reference: American Diabetes Association: Clinical Practice Recommendations 2010, Diabetes Care, 2010, 33: (Suppl  1).    CBG: Recent Labs  Lab 10/26/18 1803 10/27/18 1543 10/27/18 1935 10/27/18 2308 10/28/18 0309  GLUCAP 130* 167* 176* 199* 133*   Tammy Parrett NP-C  [facility]  [phone]   10/28/2018, 9:01 AM    Attending Note:  I have examined patient, reviewed labs, studies and notes.   85 year old man with coronary disease and history of CABG, peripheral vascular disease, CKD and cardiomyopathy who was admitted with an ST elevation MI and a VT/VF arrest.  He went urgently to the Cath Lab for stent placement, was found to be in cardiogenic shock with associated pulmonary edema.  Required intubation and mechanical ventilation.  Critically ill with multiorgan failure, evolving renal failure.  He has bilateral pulmonary infiltrates that are presumed edema but now febrile >  linezolid, zosyn have been added.  Pressors adjusted and milrinone has been discontinued.  Stress dose steroids added 2/1.  Tube feedings have been held for high residual volumes.  Vitals:   10/28/18 1000 10/28/18 1100 10/28/18 1200 10/28/18 1300  BP: 107/80 (!) 130/113 (!) 119/54 (!) 91/44  Pulse: 80 72 94 86  Resp: 14 17 15 15   Temp: 100 F (37.8 C) 100.2 F (37.9 C) 100.2 F (37.9 C) 100.2 F (37.9 C)  TempSrc:      SpO2: 100% 99% 99% 99%  Weight:      Height:      Ill-appearing man, sedated and mechanically ventilated.  Endotracheal tube is in place, oropharynx otherwise clear.  His heart is irregular with a 2 out of 6 systolic murmur.  He has coarse bilateral breath sounds.  Abdomen is distended without apparent tenderness.  Hypoactive bowel sounds.  He has trace to 1+ peripheral edema.  Labs confirm progressive renal failure, likely ATN but no current urgent indication for hemodialysis.   We will plan to continue his current mechanical  ventilator support.  He is not in a position to consider spontaneous breathing at this time due to his hemodynamic instability, evolving renal insufficiency.  Agree with empiric antibiotics to cover for possible pneumonia.  He has evolving renal failure and volume removal will be an issue.  Nephrology is going to evaluate him today.  Unclear to me whether he is a good candidate for hemodialysis but they may want to proceed with this at least short-term to see if he can recover from this critical illness.  Discussed case with Drs. Bensimhon and Sanders.  Independent critical care time is 34 minutes.   Baltazar Apo, MD, PhD 10/28/2018, 1:54 PM [facility] [phone] or if no answer [phone]

## 2018-10-28 NOTE — Plan of Care (Signed)
  Problem: Cardiac: Goal: Vascular access site(s) Level 0-1 will be maintained Outcome: Progressing   

## 2018-10-28 NOTE — Progress Notes (Addendum)
Pt jerk straight up in the bed, grabbing both sides of the bed, pressure drop into 80's/40's. Contacted [facility] and inform them of event, went up Fentanyl to 235mcg, pt calm back down, but continues to have increase in ectopy of PVC's, will continue to monitor.Family in room.

## 2018-10-28 DEATH — deceased

## 2018-10-29 ENCOUNTER — Inpatient Hospital Stay (HOSPITAL_COMMUNITY): Payer: Medicare HMO

## 2018-10-29 LAB — POCT I-STAT 7, (LYTES, BLD GAS, ICA,H+H)
Acid-base deficit: 10 mmol/L — ABNORMAL HIGH (ref 0.0–2.0)
Bicarbonate: 17.1 mmol/L — ABNORMAL LOW (ref 20.0–28.0)
Calcium, Ion: 1.02 mmol/L — ABNORMAL LOW (ref 1.15–1.40)
HCT: 24 % — ABNORMAL LOW (ref 39.0–52.0)
Hemoglobin: 8.2 g/dL — ABNORMAL LOW (ref 13.0–17.0)
O2 SAT: 89 %
PH ART: 7.214 — AB (ref 7.350–7.450)
Potassium: 5 mmol/L (ref 3.5–5.1)
Sodium: 128 mmol/L — ABNORMAL LOW (ref 135–145)
TCO2: 18 mmol/L — ABNORMAL LOW (ref 22–32)
pCO2 arterial: 42.4 mmHg (ref 32.0–48.0)
pO2, Arterial: 68 mmHg — ABNORMAL LOW (ref 83.0–108.0)

## 2018-10-29 LAB — CBC
HEMATOCRIT: 25.1 % — AB (ref 39.0–52.0)
Hemoglobin: 8.1 g/dL — ABNORMAL LOW (ref 13.0–17.0)
MCH: 28.4 pg (ref 26.0–34.0)
MCHC: 32.3 g/dL (ref 30.0–36.0)
MCV: 88.1 fL (ref 80.0–100.0)
Platelets: 255 10*3/uL (ref 150–400)
RBC: 2.85 MIL/uL — ABNORMAL LOW (ref 4.22–5.81)
RDW: 17 % — ABNORMAL HIGH (ref 11.5–15.5)
WBC: 18.6 10*3/uL — AB (ref 4.0–10.5)
nRBC: 0 % (ref 0.0–0.2)

## 2018-10-29 LAB — BASIC METABOLIC PANEL
Anion gap: 14 (ref 5–15)
BUN: 48 mg/dL — ABNORMAL HIGH (ref 8–23)
CO2: 17 mmol/L — ABNORMAL LOW (ref 22–32)
Calcium: 7.3 mg/dL — ABNORMAL LOW (ref 8.9–10.3)
Chloride: 99 mmol/L (ref 98–111)
Creatinine, Ser: 5.6 mg/dL — ABNORMAL HIGH (ref 0.61–1.24)
GFR calc Af Amer: 10 mL/min — ABNORMAL LOW (ref >60)
GFR calc non Af Amer: 9 mL/min — ABNORMAL LOW (ref >60)
Glucose, Bld: 267 mg/dL — ABNORMAL HIGH (ref 70–99)
Potassium: 4.7 mmol/L (ref 3.5–5.1)
Sodium: 130 mmol/L — ABNORMAL LOW (ref 135–145)

## 2018-10-29 LAB — GLUCOSE, CAPILLARY
GLUCOSE-CAPILLARY: 296 mg/dL — AB (ref 70–99)
Glucose-Capillary: 245 mg/dL — ABNORMAL HIGH (ref 70–99)
Glucose-Capillary: 257 mg/dL — ABNORMAL HIGH (ref 70–99)
Glucose-Capillary: 281 mg/dL — ABNORMAL HIGH (ref 70–99)

## 2018-10-29 LAB — COOXEMETRY PANEL
Carboxyhemoglobin: 1.3 % (ref 0.5–1.5)
Methemoglobin: 1.1 % (ref 0.0–1.5)
O2 Saturation: 93.3 %
Total hemoglobin: 10.1 g/dL — ABNORMAL LOW (ref 12.0–16.0)

## 2018-10-29 LAB — MAGNESIUM: Magnesium: 1.8 mg/dL (ref 1.7–2.4)

## 2018-10-29 LAB — MPO/PR-3 (ANCA) ANTIBODIES
ANCA Proteinase 3: <3.5 U/mL (ref 0.0–3.5)
Myeloperoxidase Abs: <9 U/mL (ref 0.0–9.0)

## 2018-10-29 LAB — PHOSPHORUS: PHOSPHORUS: 3 mg/dL (ref 2.5–4.6)

## 2018-10-29 MED ORDER — DEXMEDETOMIDINE HCL IN NACL 400 MCG/100ML IV SOLN
0.4000 ug/kg/h | INTRAVENOUS | Status: DC
  Administered 2018-10-29: 1 ug/kg/h via INTRAVENOUS
  Filled 2018-10-29: qty 100

## 2018-10-29 MED ORDER — DEXTROSE 5 % IV SOLN: INTRAVENOUS | Status: DC

## 2018-10-29 MED ORDER — PRISMASOL BGK 4/2.5 32-4-2.5 MEQ/L IV SOLN
INTRAVENOUS | Status: DC
  Filled 2018-10-29 (×3): qty 5000

## 2018-10-29 MED ORDER — SODIUM CHLORIDE 0.9 % FOR CRRT
INTRAVENOUS_CENTRAL | Status: DC | PRN
  Filled 2018-10-29: qty 1000

## 2018-10-29 MED ORDER — MIDAZOLAM 50MG/50ML (1MG/ML) PREMIX INFUSION
5.0000 mg/h | INTRAVENOUS | Status: DC
  Administered 2018-10-29: 5 mg/h via INTRAVENOUS
  Filled 2018-10-29: qty 50

## 2018-10-29 MED ORDER — ACETAMINOPHEN 650 MG RE SUPP: 650.0000 mg | Freq: Four times a day (QID) | RECTAL | Status: DC | PRN

## 2018-10-29 MED ORDER — MIDAZOLAM HCL 2 MG/2ML IJ SOLN
2.0000 mg | Freq: Once | INTRAMUSCULAR | Status: DC
  Filled 2018-10-29: qty 2

## 2018-10-29 MED ORDER — GLYCOPYRROLATE 1 MG PO TABS
1.0000 mg | ORAL_TABLET | ORAL | Status: DC | PRN
  Filled 2018-10-29: qty 1

## 2018-10-29 MED ORDER — PRISMASOL BGK 4/2.5 32-4-2.5 MEQ/L REPLACEMENT SOLN
Status: DC
  Filled 2018-10-29: qty 5000

## 2018-10-29 MED ORDER — MIDAZOLAM HCL 2 MG/2ML IJ SOLN
2.0000 mg | Freq: Once | INTRAMUSCULAR | Status: AC
  Administered 2018-10-29: 2 mg via INTRAVENOUS

## 2018-10-29 MED ORDER — MIDAZOLAM HCL 2 MG/2ML IJ SOLN
INTRAMUSCULAR | Status: AC
  Filled 2018-10-29: qty 2

## 2018-10-29 MED ORDER — ACETAMINOPHEN 325 MG PO TABS: 650.0000 mg | ORAL_TABLET | Freq: Four times a day (QID) | ORAL | Status: DC | PRN

## 2018-10-29 MED ORDER — DIPHENHYDRAMINE HCL 50 MG/ML IJ SOLN: 25.0000 mg | INTRAMUSCULAR | Status: DC | PRN

## 2018-10-29 MED ORDER — PRISMASOL BGK 4/2.5 32-4-2.5 MEQ/L REPLACEMENT SOLN
Status: DC
  Filled 2018-10-29 (×2): qty 5000

## 2018-10-29 MED ORDER — GLYCOPYRROLATE 0.2 MG/ML IJ SOLN: 0.2000 mg | INTRAMUSCULAR | Status: DC | PRN

## 2018-10-29 MED ORDER — HEPARIN SODIUM (PORCINE) 1000 UNIT/ML DIALYSIS
1000.0000 [IU] | INTRAMUSCULAR | Status: DC | PRN
  Filled 2018-10-29: qty 6

## 2018-10-29 MED ORDER — MIDAZOLAM HCL 2 MG/2ML IJ SOLN
INTRAMUSCULAR | Status: AC
  Filled 2018-10-29: qty 6

## 2018-10-29 MED ORDER — POLYVINYL ALCOHOL 1.4 % OP SOLN
1.0000 [drp] | Freq: Four times a day (QID) | OPHTHALMIC | Status: DC | PRN
  Filled 2018-10-29: qty 15

## 2018-10-29 MED ORDER — ENOXAPARIN SODIUM 30 MG/0.3ML ~~LOC~~ SOLN: 30.0000 mg | SUBCUTANEOUS | Status: DC

## 2018-10-30 LAB — CULTURE, RESPIRATORY W GRAM STAIN

## 2018-10-30 LAB — CULTURE, RESPIRATORY

## 2018-10-30 LAB — RHEUMATOID ARTHRITIS PROFILE
CCP Antibodies IgG/IgA: >250 units — ABNORMAL HIGH (ref 0–19)
Rheumatoid fact SerPl-aCnc: 37.1 IU/mL — ABNORMAL HIGH (ref 0.0–13.9)

## 2018-10-31 LAB — ANCA TITERS
Atypical P-ANCA titer: <1:20 {titer}
C-ANCA: <1:20 {titer}
P-ANCA: <1:20 {titer}

## 2018-11-02 ENCOUNTER — Encounter (HOSPITAL_COMMUNITY): Payer: Self-pay | Admitting: *Deleted

## 2018-11-02 LAB — CULTURE, BLOOD (SINGLE)
Culture: NO GROWTH
Special Requests: ADEQUATE

## 2018-11-02 NOTE — Progress Notes (Signed)
Received faxed copy of DC from [facility], form completed and faxed back to them for cremation, they will send original copy by mail

## 2018-11-26 NOTE — Progress Notes (Addendum)
  After multiple discussions with consultants, given MSOF. Family has opted for comfort care.   Versed and fentanyl drips started earlier in the day.   Patient bolused with comfort drips. Pressors stopped and I personally extubated the patient with RT and RN at bedside.   Patient became apneic and passed at 1836.   Additional CCT 45 mins.   Glori Bickers, MD  6:43 PM

## 2018-11-26 NOTE — Progress Notes (Signed)
RT note- Patient extubated by Dr. Mahalia Longest. Family at bedside.

## 2018-11-26 NOTE — Progress Notes (Signed)
Advanced Heart Failure Rounding Note  PCP-Cardiologist: No primary care provider on file.   Subjective:    Intubated on 1/31. Remains intubated/sedated. Vasopressin started yesterday due to presumed septic shock. NE decreased for 50 to 25 overnight.   Anuric. Had myoclonus overnight. Creatinine up to 5.6     Echo 10/26/2018 15-20%  LHC 10/26/2018  Prox RCA to Mid RCA lesion is 100% stenosed.  Ost Cx to Prox Cx lesion is 100% stenosed.  Ost 1st Diag to 1st Diag lesion is 5% stenosed.  Prox LAD lesion is 60% stenosed.  Mid LAD-1 lesion is 80% stenosed.  Mid LAD-2 lesion is 90% stenosed.  Post intervention, there is a 0% residual stenosis in LAD.   A stent was successfully placed to LAD.   Objective:   Weight Range: 81.9 kg Body mass index is 25.91 kg/m.   Vital Signs:   Temp:  [98.2 F (36.8 C)-100.2 F (37.9 C)] 98.6 F (37 C) (02/02 0700) Pulse Rate:  [65-94] 68 (02/02 0700) Resp:  [15-23] 18 (02/02 0700) BP: (91-130)/(43-113) 112/63 (02/02 0700) SpO2:  [98 %-100 %] 100 % (02/02 0750) Arterial Line BP: (77-152)/(33-50) 119/43 (02/02 0700) FiO2 (%):  [40 %] 40 % (02/02 0750) Weight:  [81.9 kg] 81.9 kg (02/02 0500) Last BM Date: 10/18/18  Weight change: Filed Weights   10/26/18 0307 10/28/18 0600 11/09/18 0500  Weight: 74.9 kg 80 kg 81.9 kg   Intake/Output:   Intake/Output Summary (Last 24 hours) at 11-09-2018 1009 Last data filed at 11-09-18 0600 Gross per 24 hour  Intake 3619.12 ml  Output 390 ml  Net 3229.12 ml    Physical Exam   General:  Intubated/sedated  HEENT: normal +ETT OGT Neck: supple. JVP 8-9  Carotids 2+ bilat; no bruits. No lymphadenopathy or thryomegaly appreciated. Cor: PMI nondisplaced. Regular rate & rhythm. No rubs, gallops or murmurs. Lungs: +rhonchi Abdomen: soft, nontender, nondistended. No hepatosplenomegaly. No bruits or masses. Good bowel sounds. Extremities: no cyanosis, clubbing, rash, edema Neuro:intubated  sedatd   Telemetry   NSR 60 frequent Personally reviewed  Labs    CBC Recent Labs    10/28/18 1300 11/09/2018 0445  WBC 18.0* 18.6*  HGB 8.2* 8.1*  HCT 26.2* 25.1*  MCV 87.0 88.1  PLT 274 161   Basic Metabolic Panel Recent Labs    10/28/18 0411 10/28/18 0413 10/28/18 1721 Nov 09, 2018 0445  NA 134* 134*  --  130*  K 3.1* 3.1* 4.3 4.7  CL 99  --   --  99  CO2 20*  --   --  17*  GLUCOSE 157*  --   --  267*  BUN 41*  --   --  48*  CREATININE 4.74*  --   --  5.60*  CALCIUM 7.9*  --   --  7.3*  MG 2.0  --  2.0 1.8  PHOS 2.2*  --  2.2* 3.0   Liver Function Tests Recent Labs    10/28/18 0411  AST 80*  ALT 31  ALKPHOS 71  BILITOT 1.7*  PROT 5.0*  ALBUMIN 2.7*   No results for input(s): LIPASE, AMYLASE in the last 72 hours. Cardiac Enzymes No results for input(s): CKTOTAL, CKMB, CKMBINDEX, TROPONINI in the last 72 hours. BNP: BNP (last 3 results) No results for input(s): BNP in the last 8760 hours.  ProBNP (last 3 results) No results for input(s): PROBNP in the last 8760 hours.  D-Dimer No results for input(s): DDIMER in the last 72 hours. Hemoglobin  A1C No results for input(s): HGBA1C in the last 72 hours. Fasting Lipid Panel No results for input(s): CHOL, HDL, LDLCALC, TRIG, CHOLHDL, LDLDIRECT in the last 72 hours. Thyroid Function Tests No results for input(s): TSH, T4TOTAL, T3FREE, THYROIDAB in the last 72 hours.  Invalid input(s): FREET3 Other results:  Imaging   Dg Chest Port 1 View  Result Date: 11-13-2018 CLINICAL DATA:  Respiratory failure EXAM: PORTABLE CHEST 1 VIEW COMPARISON:  10/28/2018 FINDINGS: Endotracheal tube terminates 5 cm above the carina. Mild patchy bilateral lower lobe opacities, possibly atelectasis. Suspected small left pleural effusion. No pneumothorax. Cardiomegaly.  Postsurgical changes related to prior CABG. Left IJ venous catheter terminates in the lower SVC. Defibrillator pads overlying the left hemithorax. IMPRESSION:  Endotracheal tube terminates 5 cm above the carina. Mild patchy bilateral lower lobe opacities, likely atelectasis. Suspected small left pleural effusion. Electronically Signed   By: Julian Hy M.D.   On: November 13, 2018 07:40    Medications:    Scheduled Medications: . aspirin  81 mg Per Tube Daily  . atorvastatin  80 mg Per Tube q1800  . chlorhexidine gluconate (MEDLINE KIT)  15 mL Mouth Rinse BID  . Chlorhexidine Gluconate Cloth  6 each Topical Daily  . enoxaparin (LOVENOX) injection  30 mg Subcutaneous Q24H  . feeding supplement (PRO-STAT SUGAR FREE 64)  30 mL Per Tube BID  . fentaNYL (SUBLIMAZE) injection  50 mcg Intravenous Once  . hydrocortisone sod succinate (SOLU-CORTEF) inj  50 mg Intravenous Q6H  . insulin aspart  0-9 Units Subcutaneous Q4H  . mouth rinse  15 mL Mouth Rinse 10 times per day  . pantoprazole sodium  40 mg Per Tube Daily  . sodium chloride flush  10-40 mL Intracatheter Q12H  . sodium chloride flush  10-40 mL Intracatheter Q12H  . sodium chloride flush  3 mL Intravenous Q12H  . ticagrelor  90 mg Per Tube BID    Infusions: .  prismasol BGK 4/2.5    .  prismasol BGK 4/2.5    . sodium chloride Stopped (10/28/18 1530)  . amiodarone 30 mg/hr (Nov 13, 2018 0600)  . dexmedetomidine 1 mcg/kg/hr (11/13/18 0805)  . feeding supplement (VITAL AF 1.2 CAL) 30 mL/hr at 10/28/18 0440  . fentaNYL infusion INTRAVENOUS 250 mcg/hr (11-13-2018 0600)  . linezolid (ZYVOX) IV 600 mg (November 13, 2018 0624)  . norepinephrine (LEVOPHED) Adult infusion 25 mcg/min (13-Nov-2018 0600)  . piperacillin-tazobactam (ZOSYN)  IV 100 mL/hr at 11-13-2018 0600  . prismasol BGK 4/2.5    . vasopressin (PITRESSIN) infusion - *FOR SHOCK* 0.03 Units/min (13-Nov-2018 0850)    PRN Medications: sodium chloride, acetaminophen (TYLENOL) oral liquid 160 mg/5 mL, fentaNYL, heparin, LORazepam, ondansetron (ZOFRAN) IV, sodium chloride, sodium chloride flush, sodium chloride flush, sodium chloride flush  Patient Profile    [name] [name] is a 85 y.o. male with CAD s/p CABG (1989 with redo in 2007) and PCI (2010) [as of 2010 LIMA ->LAD patent, SVG to Ramus, PDA occluded, stent in native diagonal and in SVG ->diag patent], carotid disease s/p CEA, CKD III (Cr 1.5), ICM (EF 40-45%), and HTN.   [facility] with AMS (resolved) and subsequently suffered a VT/VF arrest with post-ROSC EKG demonstrative of STE in the anterior leads.  Assessment/Plan   1. Shock likely mixed cardiogenic/septic - Admitted with VT arrest and mild NSTEMI (trop 0.90).  - CT chest 1/31 c/w aspiration PNA - Sepsis improved but continues with MSOF. Now anuric and possibly uremic with myoclonus - Long discussion with family about  poor prognosis and next steps.  - I have recommended avoiding initiation of CRT and proceeding with comfort care and terminal extubation.  - They are in agreement but want to talk to more family memebers first  2. VT/VF arrest - Degree of LV dysfunction out of proportion to CAD. Suspect priamry VT in setting of worsening LV function - Continue amio.  - Keep K> 4.0 Mg > 2.0  3. NSTEMI with CAD s/p CABG x 2 - Cath 10/26/2018 showed known severe native and graft disease with essentially patent large diagonal branch and LIMA-LAD. Culprit lesion was 95% LAD after graft insertion. Treated with DES to LAD - Continue DAPT  4. Acute on chronic systolic CHF; Primarily ischemic, but ? NICM component with gradual worsening over the past 1-2 years.  - Echo 10/26/2018  EF 15% (Was 40-45% in 2014)  - Now intubated and anuric - See discussion above  5. Acute hypoxic respiratory failure, Due to aspiration PNA - On zosyn/linazelid. Have decided to proceed with comfort care  6. AKI due to ATN/VT arrest - Baseline 1.5 - now anuric. Creatinine up to 5.6 - Moving to comfort care   Per discussions above. Will transition to comfort care.   CRITICAL CARE Performed by: Glori Bickers  Total critical  care time: 45 minutes  Critical care time was exclusive of separately billable procedures and treating other patients.  Critical care was necessary to treat or prevent imminent or life-threatening deterioration.  Critical care was time spent personally by me (independent of midlevel providers or residents) on the following activities: development of treatment plan with patient and/or surrogate as well as nursing, discussions with consultants, evaluation of patient's response to treatment, examination of patient, obtaining history from patient or surrogate, ordering and performing treatments and interventions, ordering and review of laboratory studies, ordering and review of radiographic studies, pulse oximetry and re-evaluation of patient's condition.   Length of Stay: 4  Glori Bickers, MD  11/09/2018, 10:09 AM  Advanced Heart Failure Team Pager [phone] (M-F; 7a - 4p)  Please contact [facility] for night-coverage after hours (4p -7a ) and weekends on [email or web address]

## 2018-11-26 NOTE — Progress Notes (Signed)
NAME:  [name] [name], MRN:  [id number], DOB:  01-25-1932, LOS: 4 ADMISSION DATE:  10/24/2018, CONSULTATION DATE:  10/26/2018 REFERRING MD:  Dr. Ellyn Hack, CHIEF COMPLAINT:  Cardiogenic Shock/V. tach arrest  Brief History   [name] [name] is an 85 year old gentleman with CAD status post CABG, PCI, carotid artery disease status post CEA, CKD stage III, ischemic cardiomyopathy (EF 40 to 45%) who initially presented with altered mental status however went into V. tach/V. fib arrest with post ROSC EKG showing ST elevation in anterior leads.  Underwent PCI with DES and found to be in cardiogenic shock with flash pulmonary edema and significantly reduced EF 15 to 20%.  History of present illness   [name] [name] is an 85 year old gentleman with CAD status post CABG and PCI, carotid artery stenosis status post CEA, CKD stage III, ischemic cardiomyopathy (EF 40 to 45%), hypertension, hyperlipidemia, atrial fibrillation who initially presented to [facility] on 10/13/2018 with acute encephalopathy.  He was evaluated by neurology who did not think the etiology of his AMS was TIA or CVA.  Soon after admission he went into V. tach/V. fib arrest and attained ROSC after 1 minute of CPR and cardioversion.  Post resuscitation EKG revealed ST elevations in the anterior lateral leads and was urgently transported to [facility] for cardiac cardiac catheterization.  While in the lab he was found to have 95% occlusion of the LAD which was treated with DES stent.   Following cardiac catheterization, he was noted to be hypertensive with SBP 150-180s as well as flash pulmonary edema on CXR. He was placed on BiPAP and diuresis was started.  Repeat echocardiogram showed reduced EF of 15 to 20%.   Past Medical History  Coronary artery disease status post CABG, PCI Carotid artery disease status post CEA CKD stage III Ischemic cardiomyopathy (EF 40 to 45%) decreased 10 to 15% on 10/26/2018 Hypertension New onset atrial  fibrillation Chronic gout Hyperlipidemia Type 2 diabetes mellitus Rheumatoid arthritis  Significant Hospital Events   1/29 admission to [facility] with AMS> 1/29 VT/V. tach arrest> 1/29 post arrest EKG with ST elevations in anterior lateral leads> 1/30 PCI with DES of LAD> 1/30 hypertensive, pulmonary edema, repeat echo with EF 15 to 20%> 1/30 PCCM consult>  10/27/2018 family would want intubation short-term to give him adequate amount of time to recover from this event.  Consults:  Interventional cardiology Advanced heart failure team  Procedures:  1/30 PCI with DES of LAD> 10/26/2018 central line placement>>  Significant Diagnostic Tests:  1/30 echocardiogram> EF 10 to 15% severely reduced systolic function CT chest 1/31 space multifocal airspace opacity bilaterally lungs, persistent nephrogram of the visualized kidneys CT abdomen/pelvis 131 space no acute findings of the abdomen or pelvis, no ascites or bowel distention.  Bibasilar airspace opacities.  Ectatic abdominal aorta   Micro Data:  2/1 BCX2>> 2/1 Tracheal asp>>  Antimicrobials:  2/1 Zosyn 2/1 Zyvox  Interval Hx /Subjective  2/2 pressor support slightly decreased Per nurse and family last evening patient was able to follow simple commands More agitation early morning with lower sedation questionable jerking movement with stimulation Fevers resolving Family updated at bedside  Renal eval this am , need to start CRRT   Objective   Blood pressure 112/63, pulse 68, temperature 98.6 F (37 C), resp. rate 18, height 5\' 10"  (1.778 m), weight 81.9 kg, SpO2 100 %. CVP:  [6 mmHg-11 mmHg] 11 mmHg  Vent Mode: PRVC FiO2 (%):  [40 %] 40 %  Set Rate:  [18 bmp] 18 bmp Vt Set:  [580 mL] 580 mL PEEP:  [5 cmH20] 5 cmH20 Plateau Pressure:  [17 cmH20-22 cmH20] 18 cmH20   Intake/Output Summary (Last 24 hours) at 2018/11/08 0945 Last data filed at November 08, 2018 0600 Gross per 24 hour  Intake 3807.67 ml  Output 390 ml    Net 3417.67 ml   Filed Weights   10/26/18 0307 10/28/18 0600 2018/11/08 0500  Weight: 74.9 kg 80 kg 81.9 kg    Examination: General: Chronically ill-appearing male sedated on vent  HEENT: ET T in place Neuro: Sedated on the vent , no seizure activity or twitching noted   CV: Sinus bradycardia   PULM: Diminished breath sounds in the bases   GI: Soft hypoactive bowel sounds  Extremities: Warm trace edema  Skin: Intact no rash   Resolved Hospital Problem list     Assessment & Plan:  [name] [name] is an 85 year old gentleman with CAD status post CABG, PCI, carotid artery disease status post CEA, CKD stage III, ischemic cardiomyopathy (EF 40 to 45%) who initially presented with altered mental status however went into V. tach/V. fib arrest with post ROSC EKG showing ST elevation in anterior leads.  Underwent PCI with DES and found to be in cardiogenic shock with flash pulmonary edema and significantly reduced EF 15 to 20%.  10/27/2018 family want short-term intubation if needed after he is been on noninvasive mechanical ventilatory support for the last 48 hours.  #Cardiogenic shock #STEMI s/p PCI with DES to mLAD His ventricular fibrillation ventricular tachycardia arrest was thought to be ischemic given his known history of severe CAD with previous invasive interventions. Cardiology is following Currently on amiodarone/Norepi /Vaso -pressor demand seem to be slightly less today 2D echo performed on 1/30 shows decrease systolic function of 10 to 15%     #Acute hypoxic respiratory failure Probable Aspiration PNA /Septic Sh[place]ck  10/27/2018 currently 48 hours on BiPAP he has been taken off BiPAP.  After discussion with family should he acute respiratory failure he will be intubated for short-term but no tracheostomy PEG or skilled nursing facilities as an option..  Cont Vent support  VAP  IV abx with Zyvox and Zosyn  Cx pending  Stress dose steroids 2/1  Cortisol pending   #CAD s/p CABG,  PCI Status post PCI with DES 10/26/2018 Cardiology is following  #?New onset atrial fibrillation Improved rate control Cont on amiodarone drip  #Hypertension Monitor Antihypertensives on hold   #Diabetes mellitus Sliding scale insulin protocol  #CKD stage III/Acute on Chronic Renal Failure /Anuric  Renal fxn continues to worsen   Renal following  CRRT today , will need HD cath   #Carotid artery stenosis status post CEA Continue ASA Brilinta  #Hyperlipidemia Continue Lipitor  #Rheumatoid arthritis Hold ARAVA   #Chronic gout Stop Allopurinol for now   #Anemia  Stable  Transfuse for hbg >7.5   Abd distention/High TF residuals.  Tube feeds on hold 2/1   Hypokalemia - Resolved   Encephalopathy -suspect secondary to metabolic /sepsis  Wean sedation as able - If twitching returns consider EEG   Best practice:  Diet: NPO -TF on hold 2/1  Pain/Anxiety/Delirium protocol (if indicated): Sedation protocol  VAP protocol (if indicated): VAP  DVT prophylaxis: SCDs GI prophylaxis: P.o. Protonix Glucose control: SSI Mobility: Bedrest Code Status: 10/27/2018 wife and children at bedside and agreed to short-term intubation if needed. Family Communication: Wife and children updated at bedside 10/27/2018, 2/1  Disposition: Remain in ICU  Labs   CBC: Recent Labs  Lab 10/07/2018 1314 09/30/2018 2126  10/26/18 0522  10/27/18 0500 10/27/18 1719 10/28/18 0411 10/28/18 0413 10/28/18 1300 Nov 27, 2018 0445  WBC 6.6 7.0  --  10.1  --  15.1*  --  15.8*  --  18.0* 18.6*  NEUTROABS 5.0 4.2  --   --   --   --   --   --   --   --   --   HGB 11.3* 10.9*   < > 10.6*   < > 8.6* 9.2* 7.8* 7.5* 8.2* 8.1*  HCT 36.4* 35.5*   < > 34.4*   < > 26.8* 27.0* 24.8* 22.0* 26.2* 25.1*  MCV 88.3 89.6  --  87.5  --  87.6  --  87.3  --  87.0 88.1  PLT 214 288  --  288  --  274  --  302  --  274 255   < > = values in this interval not displayed.    Basic Metabolic Panel: Recent Labs  Lab  10/26/18 0522  10/26/18 1550  10/27/18 0500 10/27/18 1719 10/27/18 1938 10/28/18 0411 10/28/18 0413 10/28/18 1721 11/27/2018 0445  NA 134*   < > 134*   < > 138 134*  --  134* 134*  --  130*  K 3.2*   < > 4.9   < > 3.9 3.9  --  3.1* 3.1* 4.3 4.7  CL 101  --  103  --  106  --   --  99  --   --  99  CO2 19*  --  21*  --  21*  --   --  20*  --   --  17*  GLUCOSE 232*  --  162*  --  173*  --   --  157*  --   --  267*  BUN 24*  --  26*  --  32*  --   --  41*  --   --  48*  CREATININE 1.52*  --  1.97*  --  2.94*  --   --  4.74*  --   --  5.60*  CALCIUM 8.1*  --  8.2*  --  8.2*  --   --  7.9*  --   --  7.3*  MG 2.5*  --   --   --  2.2  --  2.1 2.0  --  2.0 1.8  PHOS  --   --   --   --   --   --  2.3* 2.2*  --  2.2* 3.0   < > = values in this interval not displayed.   GFR: Estimated Creatinine Clearance: 10.1 mL/min (A) (by C-G formula based on SCr of 5.6 mg/dL (H)). Recent Labs  Lab 10/26/18 0513  10/26/18 0802 10/27/18 0500 10/28/18 0411 10/28/18 1300 11-27-2018 0445  WBC  --    < >  --  15.1* 15.8* 18.0* 18.6*  LATICACIDVEN 1.9  --  1.8  --   --   --   --    < > = values in this interval not displayed.    Liver Function Tests: Recent Labs  Lab 10/22/2018 1314 10/03/2018 2126 10/26/18 0522 10/28/18 0411  AST 34 95* 282* 80*  ALT 16 63* 69* 31  ALKPHOS 83 81 83 71  BILITOT 1.0 0.6 1.2 1.7*  PROT 6.0* 5.8* 5.6* 5.0*  ALBUMIN 3.0* 2.9* 2.7* 2.7*   No results for input(s): LIPASE,  AMYLASE in the last 168 hours. No results for input(s): AMMONIA in the last 168 hours.  ABG    Component Value Date/Time   PHART 7.377 10/28/2018 0413   PCO2ART 35.2 10/28/2018 0413   PO2ART 62.0 (L) 10/28/2018 0413   HCO3 20.3 10/28/2018 0413   TCO2 21 (L) 10/28/2018 0413   ACIDBASEDEF 4.0 (H) 10/28/2018 0413   O2SAT 93.3 11/12/2018 0454     Coagulation Profile: Recent Labs  Lab 10/07/2018 1314  INR 1.08    Cardiac Enzymes: Recent Labs  Lab 10/16/2018 1314 10/03/2018 1539 10/07/2018 2126   TROPONINI 0.07* 0.19* 0.94*    HbA1C: Hgb A1c MFr Bld  Date/Time Value Ref Range Status  04/09/2009 11:35 PM  4.6 - 6.1 % Final   6.0 (NOTE) The ADA recommends the following therapeutic goal for glycemic control related to Hgb A1c measurement: Goal of therapy: <6.5 Hgb A1c  Reference: American Diabetes Association: Clinical Practice Recommendations 2010, Diabetes Care, 2010, 33: (Suppl  1).    CBG: Recent Labs  Lab 10/28/18 1529 10/28/18 1932 11/12/2018 0008 November 12, 2018 0434 November 12, 2018 0742  GLUCAP 245* 291* 281* 245* 296*   Tammy Parrett NP-C  [facility]  [phone]   12-Nov-2018, 9:45 AM   Attending Note:  I have examined patient, reviewed labs, studies and notes.   85 year old man with CAD/CABG, PVD, CKD and cardiomyopathy admitted after ST elevation MI.  This resulted in VT/VF arrest and multiorgan failure.  He has cardiogenic shock with associated bilateral infiltrates and pulmonary edema.  We treated him with empiric antibiotics given the concern for possible superimposed pneumonia.  He has evolving renal failure and has been seen by nephrology.  Unfortunately he has exhibited some signs of my clonus consistent with a significant brain injury  Vitals:   2018-11-12 1200 11-12-18 1300 11/12/18 1400 November 12, 2018 1600  BP: (!) 112/56 (!) 110/55 (!) 108/54 (!) 112/55  Pulse: 70 71 69 67  Resp: 18 18 18 18   Temp: 98.6 F (37 C) 98.6 F (37 C) 98.6 F (37 C) 98.4 F (36.9 C)  TempSrc:      SpO2: 99% 98% 100% 100%  Weight:    81.9 kg  Height:    5\' 10"  (1.778 m)   Multiorgan failure including shock, respiratory failure, renal failure in the setting of a cardiac arrest.  Unfortunately he is also showing signs of an hypoxemic brain injury.  Dr. Haroldine Laws has discussed these issues with family and based on those discussions and the patient's wishes the plan is to transition him to a comfort based approach.  We will try to assist in any way we can.   Baltazar Apo, MD, PhD November 12, 2018, 4:17 PM [facility] [phone] or if no answer [phone]

## 2018-11-26 NOTE — Death Summary Note (Addendum)
  Advanced Heart Failure Death Summary  Death Summary   Patient ID: [name] [name] MRN: [id number], DOB/AGE: 1932-03-15 85 y.o. Admit date: 10/16/2018 D/C date:     2018-11-01   Primary Discharge Diagnoses:  1. Ventricular tachycardia arrest 2. Shock likely mixed cardiogenic/septic 3. NSTEMI with CAD s/p CABG x 2 4. Acute on chronic systolic CHF 5. Acute hypoxic respiratory failure, Due to aspiration PNA 6. AKI due to ATN/VT arrest 7. DM2 8. Hypokalemia  Hospital Course:   [name] [name] is a 85 y.o. male with CAD s/p CABG (1989 with redo in 2007) carotid disease s/p CEA, CKD III (Cr 1.5), ICM (EF 40-45%). He presents to [facility] 10/26/2018 with AMS and complaints of episodes where his vision would go "pink" and his heart rate would drop. He had syncopal episode with loss of bladder and bowel. EMS called. He has had similar episodes in the past due to TIA, and twice in the week leading up to his admit. In ED, had returned to baseline mental status. Neurology saw and did not feel as if CVA or TIA. Started on heparin gtt and moved to floor. Pt subsequently had VT/VF arrest requiring 1 min of ACLS and 1 shock before return of pulse. Post-ROSC EKG showed STE in anterior leads. Taken emergently to cath lab which showed known severe native and graft disease with essentially patent large diagonal branch and LIMA-LAD. Culprit lesion was 95% LAD after graft insertion. Treated with DES stent to native LAD (through LIMA). Peak troponin 0.94   Echo showed EF 10%. In ICU developed extreme respiratory distress. CVP was low. CT chest showed diffuse aspiration PNA. After discussion with family patient intubated and started on broad spectrum abx. Course also c/b AKI. CCM and Renal consulted. On 2/1 developed septic shock and vasopressin added to norepinephrine.   Became anuric with rising creatine. Long discussion with family and several consultants about next steps. Given progressive decline of past 6  months, advanced age and MSOF decision made to pursue comfort care.   Versed and fentanyl drips started. Once all family arrived, patient bolused with comfort drips. Pressors stopped and I personally extubated the patient with RT and RN at bedside.   Patient became apneic and passed at 1836.   Glori Bickers, MD  6:51 PM

## 2018-11-26 NOTE — Progress Notes (Signed)
200 ml Fentanyl wasted with Harvel Quale, RN.

## 2018-11-26 NOTE — Progress Notes (Signed)
RT note-Patient has been made comfort care, family at the bedside, continue to monitor.

## 2018-11-26 NOTE — Progress Notes (Signed)
Admit: 10/22/2018 LOS: 4  61F anuric AoCKD3 after cardiac arrest --> STEMI --> LHC + PCI --> Aspiration w/ VDRF, PNA, Shock  Subjective:  . UOP < 100 . On NE + VP, NE down a tad . Defervesced . SCr up, K increasing, HCO3 17 . Discussed with daughter current status  02/01 0701 - 02/02 0700 In: 4199.4 [I.V.:3718.2; IV Piggyback:481.2] Out: 790 [Urine:90; Emesis/NG output:700]  Filed Weights   10/26/18 0307 10/28/18 0600 11/06/2018 0500  Weight: 74.9 kg 80 kg 81.9 kg    Scheduled Meds: . aspirin  81 mg Per Tube Daily  . atorvastatin  80 mg Per Tube q1800  . chlorhexidine gluconate (MEDLINE KIT)  15 mL Mouth Rinse BID  . Chlorhexidine Gluconate Cloth  6 each Topical Daily  . feeding supplement (PRO-STAT SUGAR FREE 64)  30 mL Per Tube BID  . fentaNYL (SUBLIMAZE) injection  50 mcg Intravenous Once  . hydrocortisone sod succinate (SOLU-CORTEF) inj  50 mg Intravenous Q6H  . insulin aspart  0-9 Units Subcutaneous Q4H  . mouth rinse  15 mL Mouth Rinse 10 times per day  . pantoprazole sodium  40 mg Per Tube Daily  . sodium chloride flush  10-40 mL Intracatheter Q12H  . sodium chloride flush  10-40 mL Intracatheter Q12H  . sodium chloride flush  3 mL Intravenous Q12H  . ticagrelor  90 mg Per Tube BID   Continuous Infusions: . sodium chloride Stopped (November 06, 2018 0548)  . sodium chloride Stopped (10/28/18 1530)  . amiodarone 30 mg/hr (11-06-18 0600)  . dexmedetomidine (PRECEDEX) IV infusion 1 mcg/kg/hr (06-Nov-2018 0600)  . feeding supplement (VITAL AF 1.2 CAL) 30 mL/hr at 10/28/18 0440  . fentaNYL infusion INTRAVENOUS 250 mcg/hr (11/06/18 0600)  . linezolid (ZYVOX) IV 600 mg (11/06/2018 0624)  . norepinephrine (LEVOPHED) Adult infusion 25 mcg/min (11/06/2018 0600)  . piperacillin-tazobactam (ZOSYN)  IV 100 mL/hr at 11/06/2018 0600  . vasopressin (PITRESSIN) infusion - *FOR SHOCK* 0.03 Units/min (06-Nov-2018 0000)   PRN Meds:.sodium chloride, acetaminophen (TYLENOL) oral liquid 160 mg/5 mL,  fentaNYL, LORazepam, ondansetron (ZOFRAN) IV, sodium chloride flush, sodium chloride flush, sodium chloride flush  Current Labs: reviewed    Physical Exam:  Blood pressure 112/63, pulse 68, temperature 98.6 F (37 C), resp. rate 18, height '5\' 10"'  (1.778 m), weight 81.9 kg, SpO2 100 %. GEN: Elderly male, intubated, sedated ENT: ET tube in place EYES: Eyes closed CV: Regular, normal rate, S1-S2 present, no rub PULM: Coarse breath sounds bilaterally ABD: Soft, nontender SKIN: No rashes or lesions EXT: No significant edema  A 1. Anuric AKI, ATN from CIN, Shock; BL SCR 1.5 c/w CKD3 2. Septic / Cardiogenic Shock on NE and VP 3. STEMI s/p PCI 1/29 to LAD 4. A on C sCHF LVEF 15-20% 5. Aspiration PNA; linezolid / zosyn 6. VDRF 2/2 #2,3,4 7. RA, immunosuppressed as outpt on MTX 8. Anemia  P . Needs CRRT, family wants to pursue therapy with curative intent . Needs Temp HD cath . Start CRRT after: 4K, Net even, No anticoagulation in circuit, flow settings 750 / 1000 / 250 . Grim prognosis given age, acuity   Pearson Grippe MD 11/06/18, 7:31 AM  Recent Labs  Lab 10/27/18 0500  10/28/18 0411 10/28/18 0413 10/28/18 1721 06-Nov-2018 0445  NA 138   < > 134* 134*  --  130*  K 3.9   < > 3.1* 3.1* 4.3 4.7  CL 106  --  99  --   --  99  CO2 21*  --  20*  --   --  17*  GLUCOSE 173*  --  157*  --   --  267*  BUN 32*  --  41*  --   --  48*  CREATININE 2.94*  --  4.74*  --   --  5.60*  CALCIUM 8.2*  --  7.9*  --   --  7.3*  PHOS  --    < > 2.2*  --  2.2* 3.0   < > = values in this interval not displayed.   Recent Labs  Lab 10/03/2018 1314 10/18/2018 2126  10/28/18 0411 10/28/18 0413 10/28/18 1300 2018-11-01 0445  WBC 6.6 7.0   < > 15.8*  --  18.0* 18.6*  NEUTROABS 5.0 4.2  --   --   --   --   --   HGB 11.3* 10.9*   < > 7.8* 7.5* 8.2* 8.1*  HCT 36.4* 35.5*   < > 24.8* 22.0* 26.2* 25.1*  MCV 88.3 89.6   < > 87.3  --  87.0 88.1  PLT 214 288   < > 302  --  274 255   < > = values in this  interval not displayed.

## 2018-11-26 NOTE — ED Provider Notes (Signed)
On January 29 I was called to a code to see [name].  When I arrived in the room the patient had just been cardioverted.  He had compressions being done at that time.  The patient converted to normal sinus from V. tach and V. fib after the cardioversion.  Initially he was having agonal breaths but then started to breathe more normally.  The patient had an excellent pulse and blood pressure.  The hospitalist arrived and continued care of the patient.  The patient did not need to be intubated at that time.   Milton Ferguson, MD 11/03/18 1440

## 2018-11-26 NOTE — Progress Notes (Signed)
   Nov 02, 2018 8682  Clinical Encounter Type  Visited With Patient and family together  Visit Type Initial  Referral From Nurse  Consult/Referral To Chaplain  The chaplain responded to consult for Pt. family spiritual care around EOL decision making.  The chaplain entered the Pt. room.  The chaplain was introduced to Pt. wife-Shelby and children.  The daughter indicated to the chaplain the family feels well supported by family ministers.  The daughter added the waiting is extremely hard.  The chaplain recognized the emotion and family support. The chaplain shared her personal availability and 24hr F/U spiritual care as needed.

## 2018-11-26 DEATH — deceased

## 2019-08-27 IMAGING — DX DG CHEST 1V PORT
1 series · 1 of 1 positions shown · non-contrast
Comparison: CT chest October 27, 2018.

CLINICAL DATA: Intubation and orogastric tube placement.

EXAM:
PORTABLE CHEST 1 VIEW

[chest]
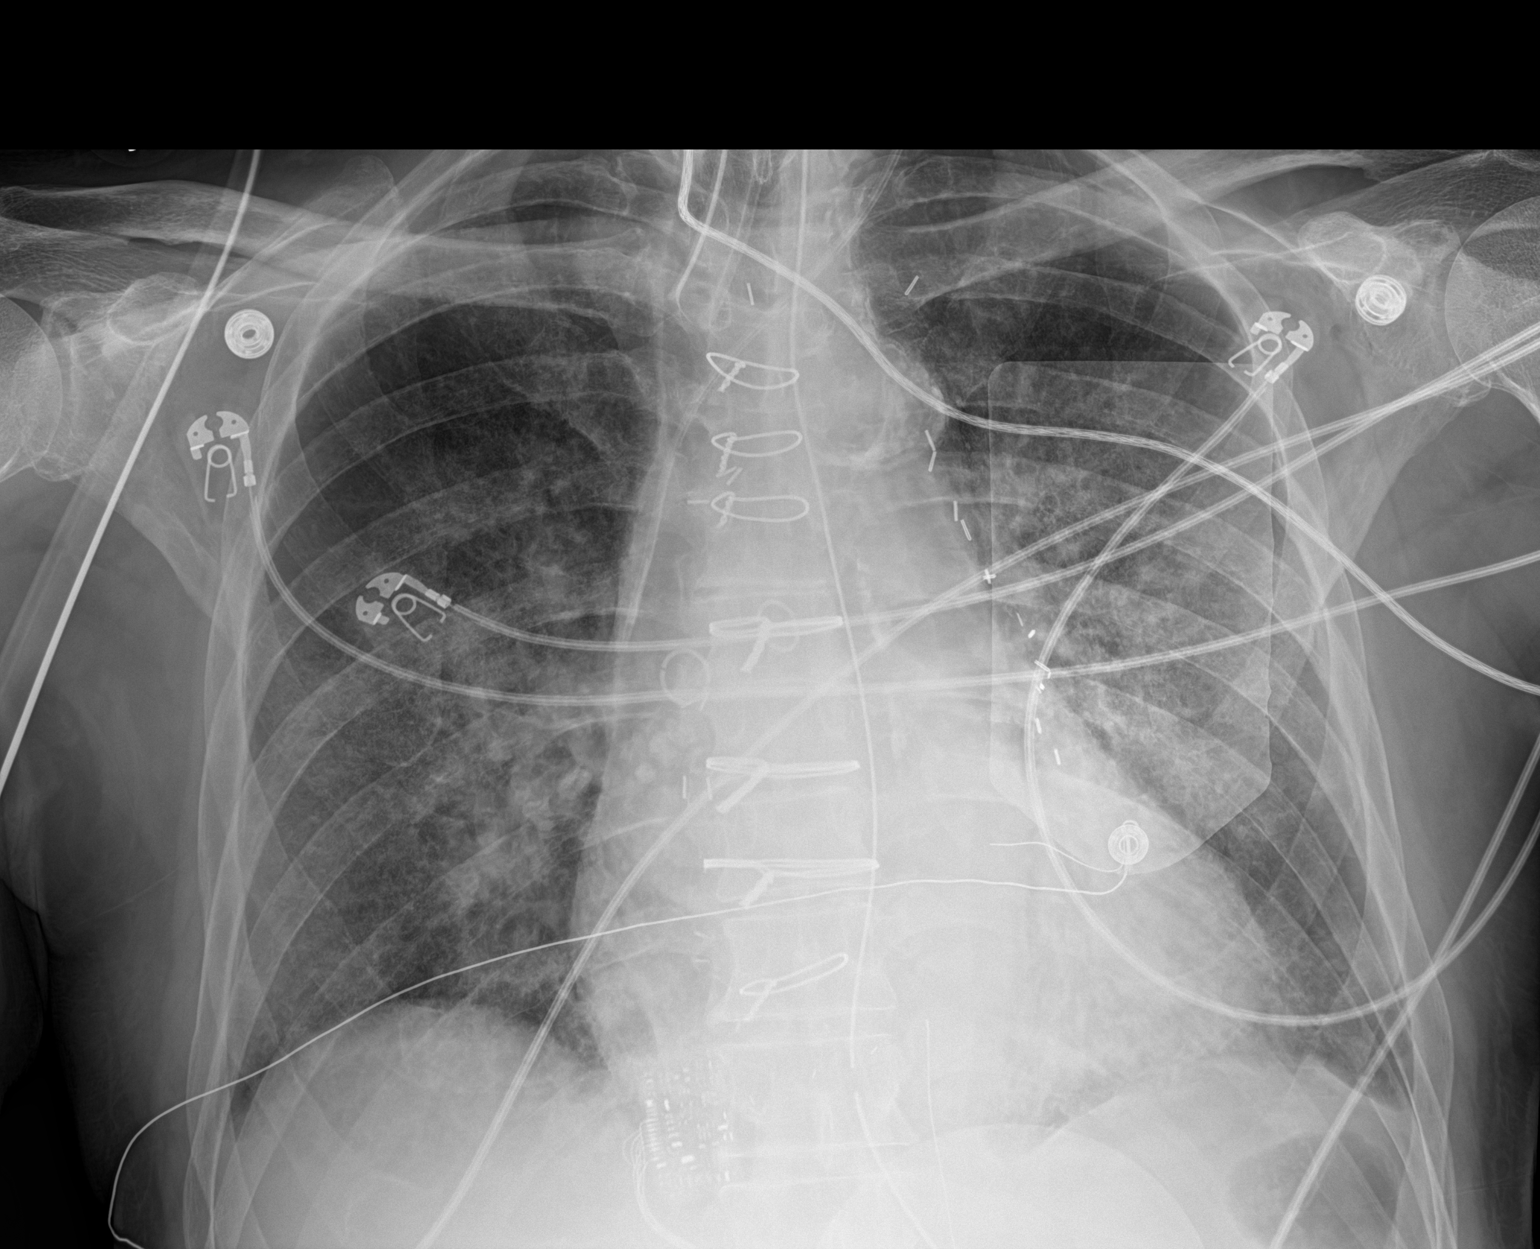

[1 of 1 positions shown; findings below may reference images not displayed]

FINDINGS: Endotracheal tube tip projects 5 cm above the carina. Nasogastric
tube past GE junction, side port projects in distal esophagus,
distal tip out of field of view. LEFT internal jugular central
venous catheter distal tip projects in mid superior vena cava.

Interstitial and alveolar airspace opacities. No pleural effusion.
Cardiac silhouette is mildly enlarged. Calcified aortic arch. No
pneumothorax.
IMPRESSION: 1. Endotracheal tube tip projects 5 cm above the carina. Nasogastric
tube side port projects in distal esophagus. LEFT internal jugular
central venous catheter distal tip projects in mid superior vena
cava. No pneumothorax.
2. Interstitial and alveolar airspace opacities most compatible with
pneumonia, seen on today's CT.
3. Cardiomegaly.
4.  Aortic Atherosclerosis (TCCKH-LN4.4).

## 2019-08-27 IMAGING — CT CT ABD-PELV W/O
2 of 4 series · 16 of 46 positions shown, 18 images · non-contrast
Comparison: CT, 03/18/2017. Current chest radiographs and chest CT.

CLINICAL DATA: Abdominal swelling.  Suspect ascites.

EXAM:
CT ABDOMEN AND PELVIS WITHOUT CONTRAST
TECHNIQUE: Multidetector CT imaging of the abdomen and pelvis was performed
following the standard protocol without IV contrast.

[Series 3: a/p w/o 5mm · axial · non-contrast · 0.97mm/px · z∈[+800,+1305]mm · 13 of 111 slices shown, 15 images]
[im 5/111  soft-tissue]
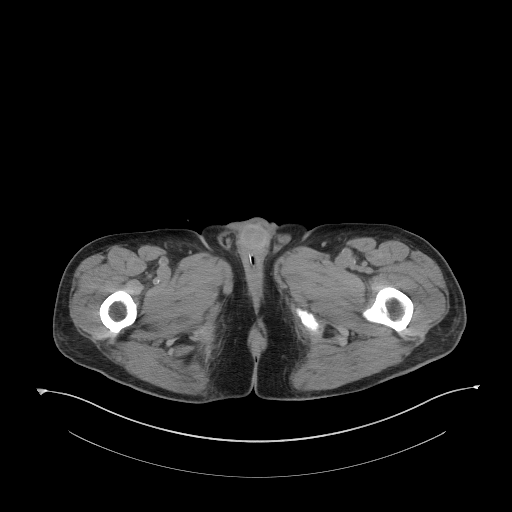
[im 5/111  bone]
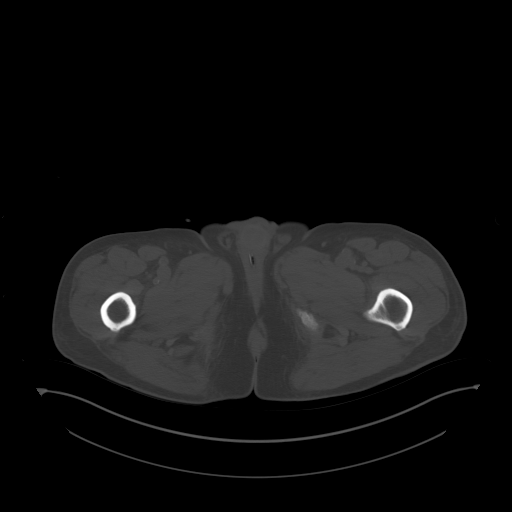
[im 14/111  soft-tissue]
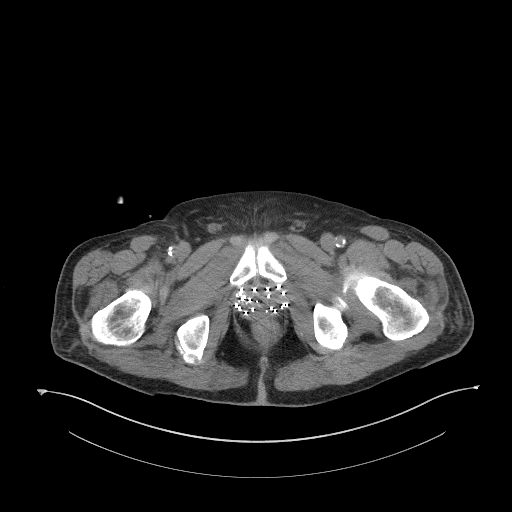
[im 23/111  soft-tissue]
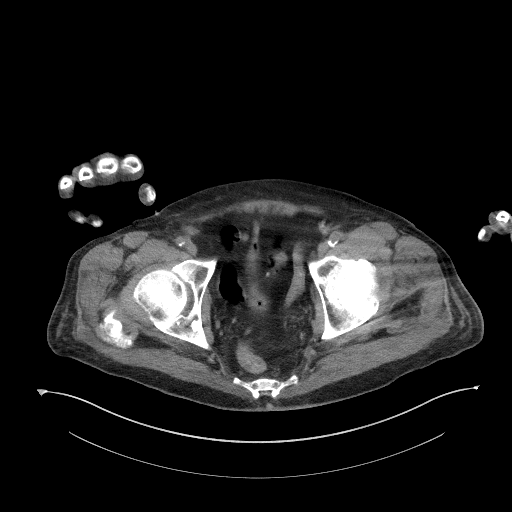
[im 31/111  soft-tissue]
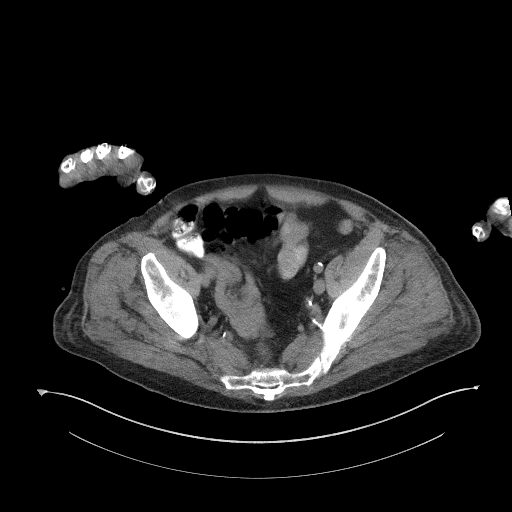
[im 40/111  soft-tissue]
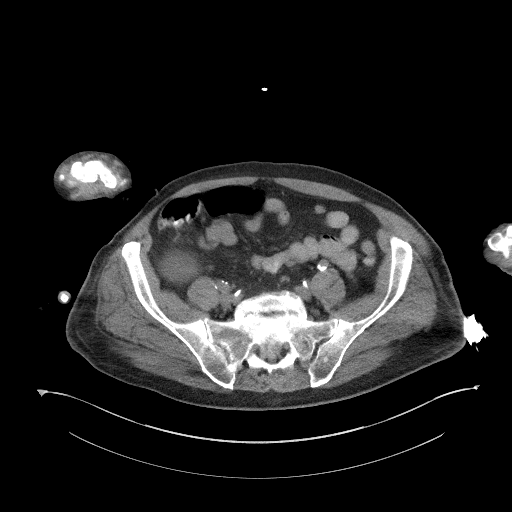
[im 49/111  soft-tissue]
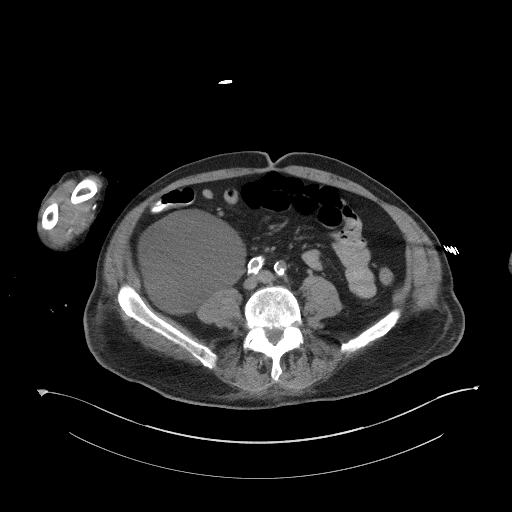
[im 58/111  soft-tissue]
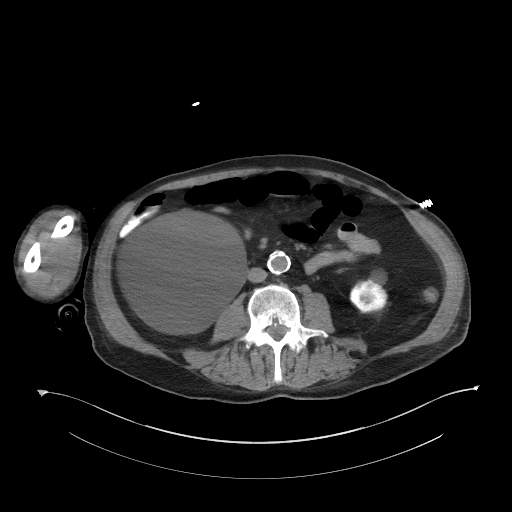
[im 62/111  soft-tissue]
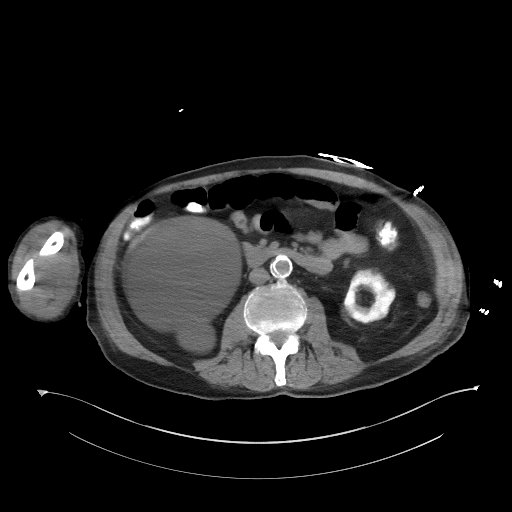
[im 71/111  soft-tissue]
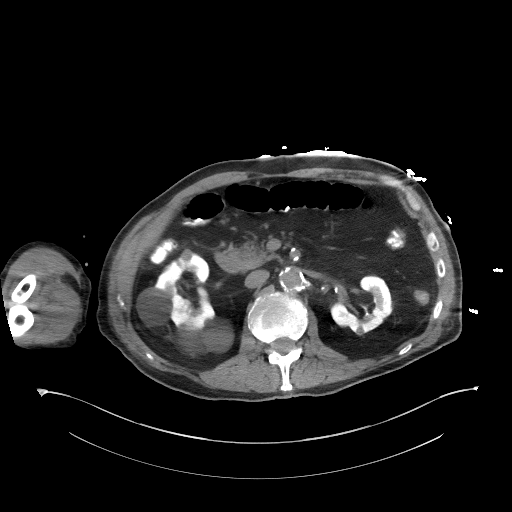
[im 71/111  bone]
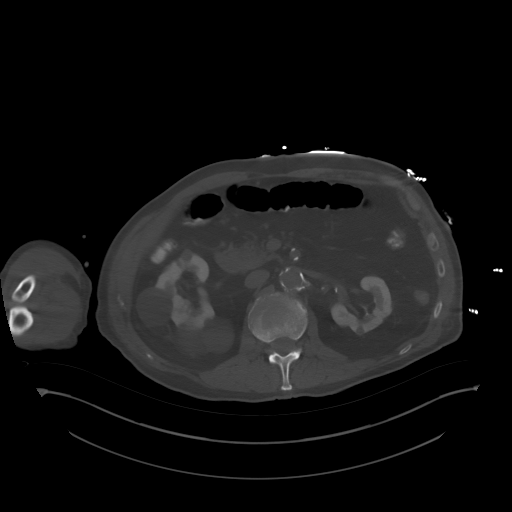
[im 80/111  soft-tissue]
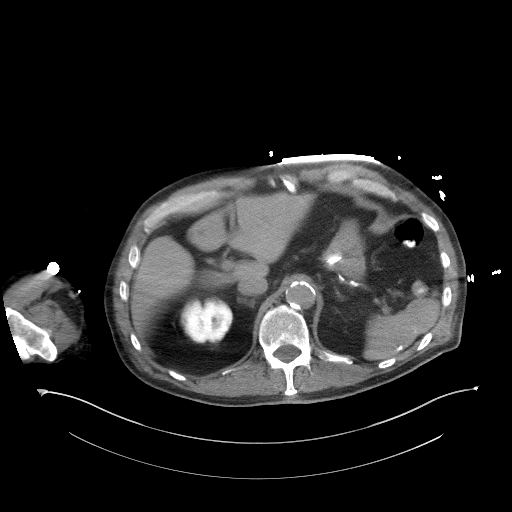
[im 89/111  soft-tissue]
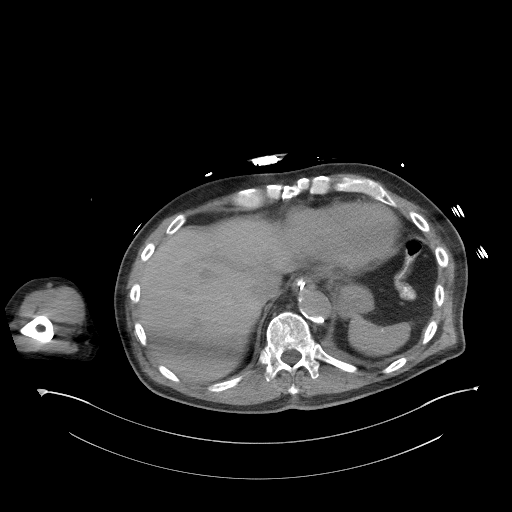
[im 97/111  soft-tissue]
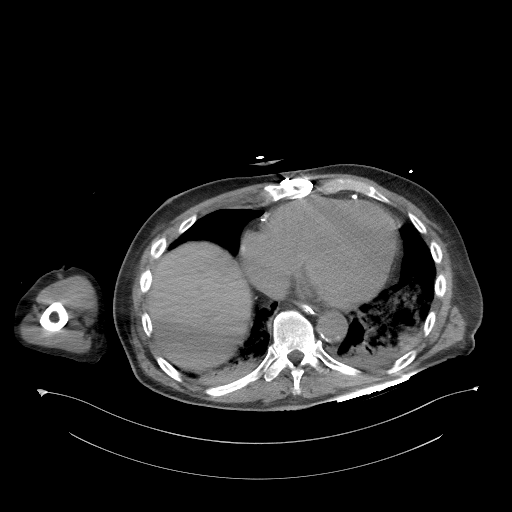
[im 106/111  soft-tissue]
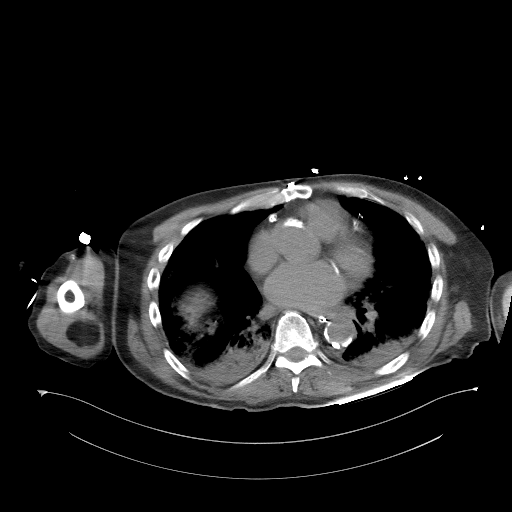

[Series 6: a/p w/o cor · coronal · non-contrast · 0.97mm/px · 3 of 143 slices shown]
[im 48/143  soft-tissue]
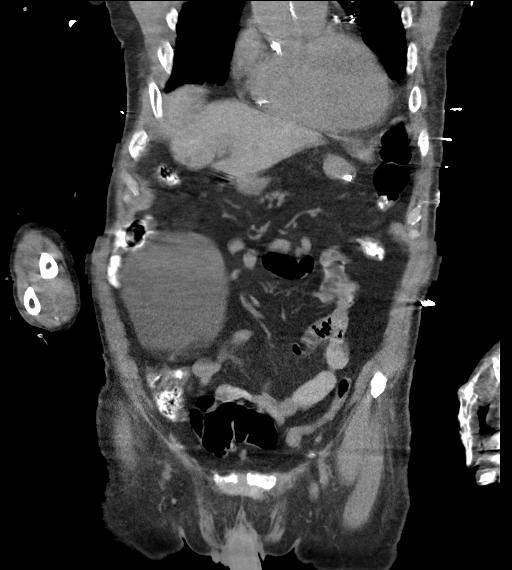
[im 64/143  soft-tissue]
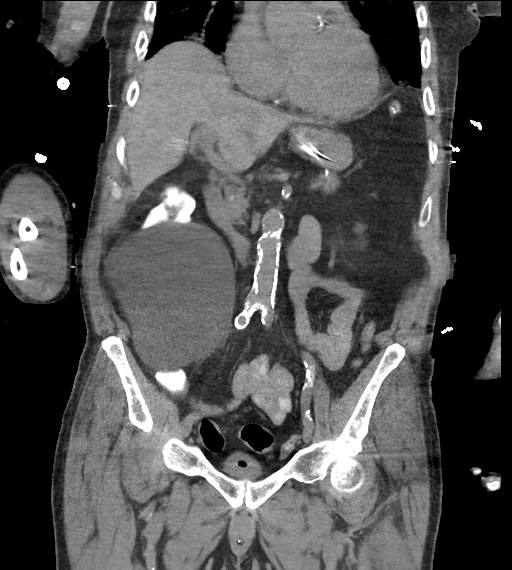
[im 79/143  soft-tissue]
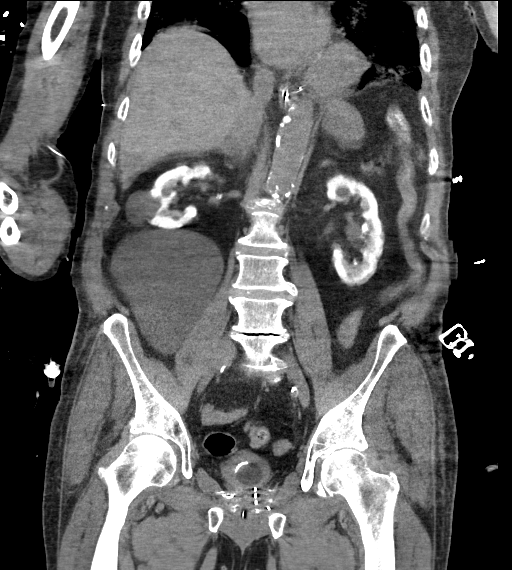

[16 of 46 positions shown; findings below may reference images not displayed]

FINDINGS: Lower chest: Ground-glass and dense consolidation is noted at the
lung bases, most confluent dependently. There changes from cardiac
surgery.

Hepatobiliary: Liver normal in size and attenuation. No mass or
focal lesion. Gallbladder surgically absent. No bile duct dilation.

Pancreas: Unremarkable. No pancreatic ductal dilatation or
surrounding inflammatory changes.

Spleen: Spleen normal in size. There are splenic calcifications
consistent with healed granuloma. No masses.

Adrenals/Urinary Tract: Intermediate attenuation 17 mm right adrenal
mass similar to the prior CT. Normal left adrenal gland.

Left greater than right renal cortical thinning.. Numerous bilateral
low-attenuation homogeneous renal masses consistent with cysts.
Largest arises from the lower pole the right kidney measuring 14 cm.
No stones. No hydronephrosis. Ureters are normal in course and in
caliber. Bladder is decompressed with a Foley catheter.

Stomach/Bowel: Stomach decompressed with a nasal/orogastric tube.
Stomach otherwise unremarkable.

Small bowel and colon are normal in caliber. No wall thickening or
inflammation. Normal appendix visualized.

Vascular/Lymphatic: Diffuse aortic atherosclerosis. Mild ectasia.
Maximum AP diameter of the infrarenal abdominal aorta is 2.5 cm. No
lymphadenopathy.

Reproductive: Multiple radiation therapy seeds lie within prostate
and prostate bed, stable.

Other: No ascites.  No abdominal wall hernia.

Musculoskeletal: No fracture or acute finding. No osteoblastic or
osteolytic lesions.
IMPRESSION: 1. No acute findings within the abdomen or pelvis.
2. No ascites.  No bowel distention.
3. Airspace opacities at both lung bases which for more
comprehensively evaluated on the current chest CT.
4. Multiple renal cysts.
5. Aortic atherosclerosis and ectasia.
Ectatic abdominal aorta at risk for aneurysm development. Recommend
followup by ultrasound in 5 years. This recommendation follows ACR
consensus guidelines: White Paper of the ACR Incidental Findings
Committee II on Vascular Findings. [HOSPITAL] [id number];
Aortic aneurysm NOS ([id number]-9HT.8)

## 2019-08-29 IMAGING — DX DG CHEST 1V PORT
1 series · 1 of 1 positions shown · non-contrast
Comparison: 10/28/2018

CLINICAL DATA: Respiratory failure

EXAM:
PORTABLE CHEST 1 VIEW

[chest ap]
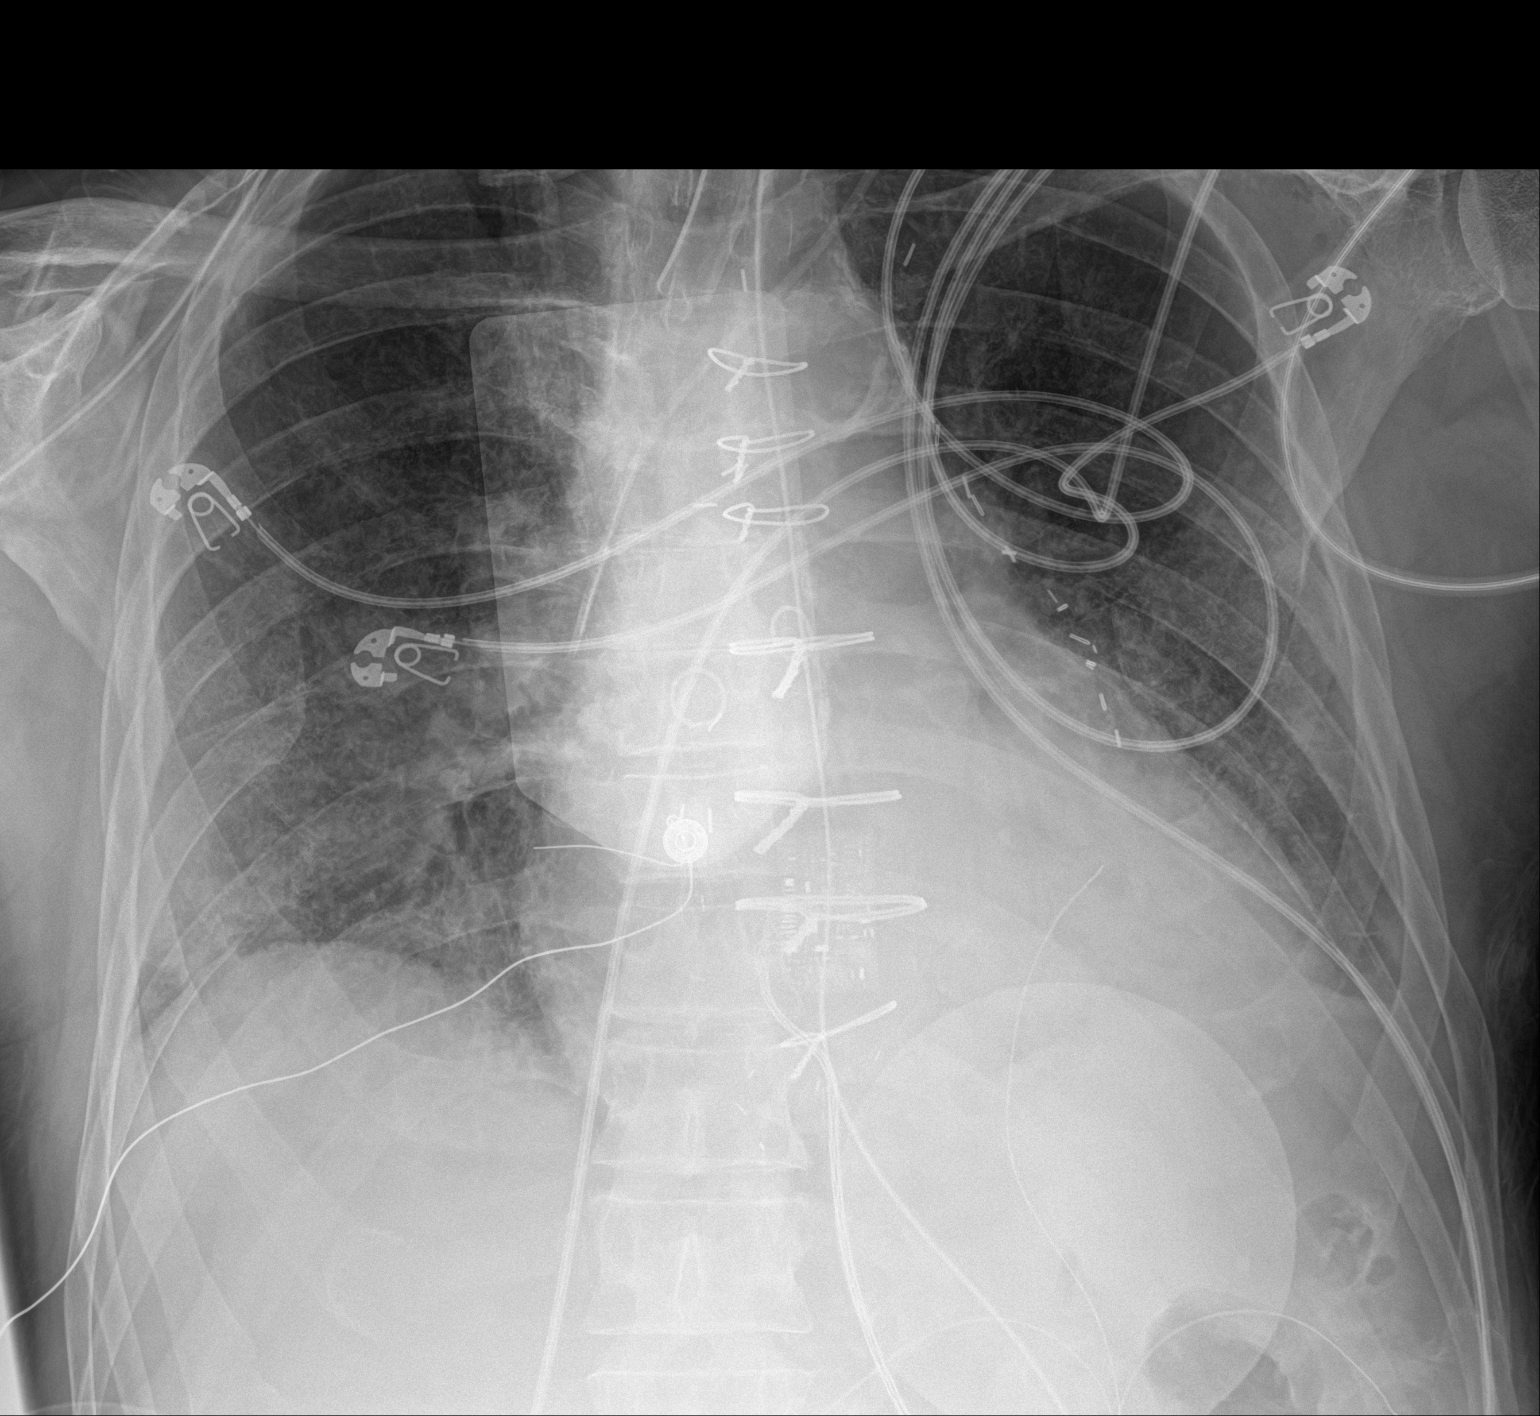

[1 of 1 positions shown; findings below may reference images not displayed]

FINDINGS: Endotracheal tube terminates 5 cm above the carina.

Mild patchy bilateral lower lobe opacities, possibly atelectasis.
Suspected small left pleural effusion. No pneumothorax.

Cardiomegaly.  Postsurgical changes related to prior CABG.

Left IJ venous catheter terminates in the lower SVC.

Defibrillator pads overlying the left hemithorax.
IMPRESSION: Endotracheal tube terminates 5 cm above the carina.

Mild patchy bilateral lower lobe opacities, likely atelectasis.
Suspected small left pleural effusion.
